# Patient Record
Sex: Male | Born: 1940
Health system: Southern US, Community
[De-identification: ages and names within clinical notes are randomized; demographics above are authoritative.]

## PROBLEM LIST (undated history)

## (undated) DIAGNOSIS — H9193 Unspecified hearing loss, bilateral: Secondary | ICD-10-CM

## (undated) DIAGNOSIS — C220 Liver cell carcinoma: Secondary | ICD-10-CM

## (undated) DIAGNOSIS — I10 Essential (primary) hypertension: Secondary | ICD-10-CM

## (undated) DIAGNOSIS — K7460 Unspecified cirrhosis of liver: Secondary | ICD-10-CM

## (undated) DIAGNOSIS — M1990 Unspecified osteoarthritis, unspecified site: Secondary | ICD-10-CM

## (undated) DIAGNOSIS — I8500 Esophageal varices without bleeding: Secondary | ICD-10-CM

## (undated) DIAGNOSIS — K7030 Alcoholic cirrhosis of liver without ascites: Secondary | ICD-10-CM

## (undated) HISTORY — PX: HERNIA REPAIR: SHX51

## (undated) HISTORY — PX: OTHER SURGICAL HISTORY: SHX169

---

## 1997-11-13 ENCOUNTER — Ambulatory Visit (HOSPITAL_COMMUNITY): Admission: RE | Admit: 1997-11-13 | Discharge: 1997-11-13 | Payer: Self-pay | Admitting: *Deleted

## 1998-02-07 ENCOUNTER — Ambulatory Visit (HOSPITAL_COMMUNITY): Admission: RE | Admit: 1998-02-07 | Discharge: 1998-02-07 | Payer: Self-pay | Admitting: *Deleted

## 1998-03-17 ENCOUNTER — Ambulatory Visit (HOSPITAL_COMMUNITY): Admission: RE | Admit: 1998-03-17 | Discharge: 1998-03-17 | Payer: Self-pay | Admitting: *Deleted

## 2001-09-01 ENCOUNTER — Ambulatory Visit (HOSPITAL_COMMUNITY): Admission: RE | Admit: 2001-09-01 | Discharge: 2001-09-01 | Payer: Self-pay | Admitting: *Deleted

## 2004-10-15 ENCOUNTER — Ambulatory Visit (HOSPITAL_COMMUNITY): Admission: RE | Admit: 2004-10-15 | Discharge: 2004-10-15 | Payer: Self-pay

## 2009-09-23 ENCOUNTER — Encounter: Admission: RE | Admit: 2009-09-23 | Discharge: 2009-09-23 | Payer: Self-pay | Admitting: Gastroenterology

## 2009-10-23 ENCOUNTER — Ambulatory Visit (HOSPITAL_COMMUNITY): Admission: RE | Admit: 2009-10-23 | Discharge: 2009-10-23 | Payer: Self-pay | Admitting: Gastroenterology

## 2009-12-11 ENCOUNTER — Ambulatory Visit (HOSPITAL_COMMUNITY): Admission: RE | Admit: 2009-12-11 | Discharge: 2009-12-11 | Payer: Self-pay | Admitting: Gastroenterology

## 2010-04-02 ENCOUNTER — Ambulatory Visit (HOSPITAL_COMMUNITY): Admission: RE | Admit: 2010-04-02 | Discharge: 2010-04-02 | Payer: Self-pay | Admitting: Gastroenterology

## 2010-08-09 ENCOUNTER — Encounter: Payer: Self-pay | Admitting: Gastroenterology

## 2010-10-07 LAB — CBC
MCHC: 34.7 g/dL (ref 30.0–36.0)
RBC: 3.52 MIL/uL — ABNORMAL LOW (ref 4.22–5.81)

## 2010-10-07 LAB — PROTIME-INR
INR: 1.17 (ref 0.00–1.49)
Prothrombin Time: 14.8 seconds (ref 11.6–15.2)

## 2011-08-19 ENCOUNTER — Other Ambulatory Visit: Payer: Self-pay | Admitting: Gastroenterology

## 2011-08-19 DIAGNOSIS — K7460 Unspecified cirrhosis of liver: Secondary | ICD-10-CM

## 2011-08-26 ENCOUNTER — Inpatient Hospital Stay: Admission: RE | Admit: 2011-08-26 | Payer: Self-pay | Source: Ambulatory Visit

## 2011-09-16 ENCOUNTER — Other Ambulatory Visit: Payer: Self-pay | Admitting: Gastroenterology

## 2011-09-16 ENCOUNTER — Ambulatory Visit
Admission: RE | Admit: 2011-09-16 | Discharge: 2011-09-16 | Disposition: A | Discharge status: Home / Self Care | Payer: Medicare Other | Source: Ambulatory Visit | Attending: Gastroenterology | Admitting: Gastroenterology

## 2011-09-16 DIAGNOSIS — K7460 Unspecified cirrhosis of liver: Secondary | ICD-10-CM

## 2011-09-16 MED ORDER — IOHEXOL 300 MG/ML  SOLN
100.0000 mL | Freq: Once | INTRAMUSCULAR | Status: AC | PRN
  Administered 2011-09-16: 100 mL via INTRAVENOUS

## 2013-08-14 ENCOUNTER — Other Ambulatory Visit: Payer: Self-pay | Admitting: Gastroenterology

## 2013-08-14 DIAGNOSIS — K7460 Unspecified cirrhosis of liver: Secondary | ICD-10-CM

## 2013-08-21 ENCOUNTER — Ambulatory Visit
Admission: RE | Admit: 2013-08-21 | Discharge: 2013-08-21 | Disposition: A | Discharge status: Home / Self Care | Payer: Medicare Other | Source: Ambulatory Visit | Attending: Gastroenterology | Admitting: Gastroenterology

## 2013-08-21 DIAGNOSIS — K7460 Unspecified cirrhosis of liver: Secondary | ICD-10-CM

## 2014-05-01 ENCOUNTER — Other Ambulatory Visit: Payer: Self-pay | Admitting: Gastroenterology

## 2014-09-20 ENCOUNTER — Other Ambulatory Visit: Payer: Self-pay | Admitting: Gastroenterology

## 2014-09-20 DIAGNOSIS — K7030 Alcoholic cirrhosis of liver without ascites: Secondary | ICD-10-CM

## 2014-09-20 DIAGNOSIS — R1084 Generalized abdominal pain: Secondary | ICD-10-CM

## 2014-09-26 ENCOUNTER — Ambulatory Visit
Admission: RE | Admit: 2014-09-26 | Discharge: 2014-09-26 | Disposition: A | Discharge status: Home / Self Care | Payer: Self-pay | Source: Ambulatory Visit | Attending: Gastroenterology | Admitting: Gastroenterology

## 2014-09-26 DIAGNOSIS — K7030 Alcoholic cirrhosis of liver without ascites: Secondary | ICD-10-CM

## 2014-09-26 DIAGNOSIS — R1084 Generalized abdominal pain: Secondary | ICD-10-CM

## 2014-09-26 MED ORDER — IOPAMIDOL (ISOVUE-300) INJECTION 61%
100.0000 mL | Freq: Once | INTRAVENOUS | Status: AC | PRN
  Administered 2014-09-26: 100 mL via INTRAVENOUS

## 2015-09-02 DIAGNOSIS — H1132 Conjunctival hemorrhage, left eye: Secondary | ICD-10-CM | POA: Diagnosis not present

## 2015-09-02 DIAGNOSIS — S0500XA Injury of conjunctiva and corneal abrasion without foreign body, unspecified eye, initial encounter: Secondary | ICD-10-CM | POA: Diagnosis not present

## 2015-09-11 DIAGNOSIS — H1132 Conjunctival hemorrhage, left eye: Secondary | ICD-10-CM | POA: Diagnosis not present

## 2015-09-18 ENCOUNTER — Other Ambulatory Visit: Payer: Self-pay | Admitting: Gastroenterology

## 2015-09-18 DIAGNOSIS — K7030 Alcoholic cirrhosis of liver without ascites: Secondary | ICD-10-CM

## 2015-09-30 ENCOUNTER — Ambulatory Visit
Admission: RE | Admit: 2015-09-30 | Discharge: 2015-09-30 | Disposition: A | Discharge status: Home / Self Care | Payer: Medicare Other | Source: Ambulatory Visit | Attending: Gastroenterology | Admitting: Gastroenterology

## 2015-09-30 DIAGNOSIS — K7030 Alcoholic cirrhosis of liver without ascites: Secondary | ICD-10-CM

## 2016-04-14 DIAGNOSIS — Z23 Encounter for immunization: Secondary | ICD-10-CM | POA: Diagnosis not present

## 2016-05-12 ENCOUNTER — Other Ambulatory Visit: Payer: Self-pay | Admitting: Gastroenterology

## 2016-05-12 DIAGNOSIS — K7030 Alcoholic cirrhosis of liver without ascites: Secondary | ICD-10-CM

## 2016-05-17 ENCOUNTER — Ambulatory Visit
Admission: RE | Admit: 2016-05-17 | Discharge: 2016-05-17 | Disposition: A | Discharge status: Home / Self Care | Payer: Medicare Other | Source: Ambulatory Visit | Attending: Gastroenterology | Admitting: Gastroenterology

## 2016-05-17 DIAGNOSIS — K7689 Other specified diseases of liver: Secondary | ICD-10-CM | POA: Diagnosis not present

## 2016-05-17 DIAGNOSIS — K7030 Alcoholic cirrhosis of liver without ascites: Secondary | ICD-10-CM

## 2016-05-17 MED ORDER — IOPAMIDOL (ISOVUE-300) INJECTION 61%
100.0000 mL | Freq: Once | INTRAVENOUS | Status: AC | PRN
  Administered 2016-05-17: 100 mL via INTRAVENOUS

## 2016-05-25 DIAGNOSIS — C220 Liver cell carcinoma: Secondary | ICD-10-CM | POA: Diagnosis not present

## 2016-06-08 ENCOUNTER — Other Ambulatory Visit (HOSPITAL_COMMUNITY): Payer: Self-pay | Admitting: General Surgery

## 2016-06-08 ENCOUNTER — Other Ambulatory Visit: Payer: Self-pay | Admitting: General Surgery

## 2016-06-08 DIAGNOSIS — C220 Liver cell carcinoma: Secondary | ICD-10-CM

## 2016-06-17 ENCOUNTER — Ambulatory Visit (HOSPITAL_COMMUNITY): Payer: Medicare Other

## 2016-06-17 ENCOUNTER — Ambulatory Visit
Admission: RE | Admit: 2016-06-17 | Discharge: 2016-06-17 | Disposition: A | Discharge status: Home / Self Care | Payer: No Typology Code available for payment source | Source: Ambulatory Visit | Attending: General Surgery | Admitting: General Surgery

## 2016-06-17 ENCOUNTER — Ambulatory Visit (HOSPITAL_COMMUNITY)
Admission: RE | Admit: 2016-06-17 | Discharge: 2016-06-17 | Disposition: A | Discharge status: Home / Self Care | Payer: Medicare Other | Source: Ambulatory Visit | Attending: General Surgery | Admitting: General Surgery

## 2016-06-17 DIAGNOSIS — C220 Liver cell carcinoma: Secondary | ICD-10-CM

## 2016-06-17 DIAGNOSIS — K769 Liver disease, unspecified: Secondary | ICD-10-CM | POA: Diagnosis not present

## 2016-06-17 DIAGNOSIS — K7460 Unspecified cirrhosis of liver: Secondary | ICD-10-CM | POA: Diagnosis not present

## 2016-06-17 HISTORY — DX: Alcoholic cirrhosis of liver without ascites: K70.30

## 2016-06-17 HISTORY — DX: Unspecified cirrhosis of liver: K74.60

## 2016-06-17 HISTORY — DX: Esophageal varices without bleeding: I85.00

## 2016-06-17 HISTORY — DX: Essential (primary) hypertension: I10

## 2016-06-17 HISTORY — DX: Liver cell carcinoma: C22.0

## 2016-06-17 HISTORY — PX: IR GENERIC HISTORICAL: IMG1180011

## 2016-06-17 MED ORDER — GADOXETATE DISODIUM 0.25 MMOL/ML IV SOLN
10.0000 mL | Freq: Once | INTRAVENOUS | Status: AC | PRN
  Administered 2016-06-17: 6 mL via INTRAVENOUS

## 2016-06-17 NOTE — Consult Note (Signed)
Chief Complaint: Indeterminate liver lesion  Referring Physician(s): Byerly (Hepatobiliary Surgery) [name] (GI)  History of Present Illness: Mike Ferguson is a 75 y.o. male with past medical history significant for hypertension, esophageal varices and alcoholic cirrhosis (patient has not consumed alcohol since 2011) who was found to have an indeterminate liver lesion worrisome for hepatocellular carcinoma on an surveillance abdominal CT performed 05/17/2016 obtained for an elevated AFP of 23 (on 10/23).  Patient was initially seen in consultation by Dr. Barry Dienes on 11/9 who felt the patient could benefit from undergoing a microwave ablation in lieu of surgical resection and referred the patient to interventional radiology clinic for evaluation of percutaneous treatment options. The patient is accompanied by his wife though serves as his own historian.  The patient is currently without complaint. No abdominal pain. No fever or chills. No unintentional weight loss or weight gain. No nausea or vomiting. No yellowing of the skin or eyes. No increased abdominal girth. No confusion.  Past Medical History:  Diagnosis Date  . Cirrhosis ([place])   . Cirrhosis, alcoholic ([place])   . Esophageal varices ([place])   . Hepatocellular carcinoma ([place])   . Hypertension     Past Surgical History:  Procedure Laterality Date  . HERNIA REPAIR    . Left rotator cuff      Allergies: Erythromycin  Medications: Prior to Admission medications   Medication Sig Start Date End Date Taking? Authorizing Provider  Cyanocobalamin (VITAMIN B 12 PO) Take 1 tablet by mouth daily.   Yes Historical Provider, MD  loratadine-pseudoephedrine (CLARITIN-D 24-HOUR) 10-240 MG 24 hr tablet Take 1 tablet by mouth daily.   Yes Historical Provider, MD  mirtazapine (REMERON) 15 MG tablet Take 15 mg by mouth at bedtime.   Yes Historical Provider, MD  propranolol (INDERAL) 10 MG tablet Take 10 mg by mouth 2 (two) times daily.   Yes  Historical Provider, MD  rifaximin (XIFAXAN) 550 MG TABS tablet Take 550 mg by mouth 2 (two) times daily.   Yes Historical Provider, MD  sodium chloride (OCEAN) 0.65 % SOLN nasal spray Place 1 spray into both nostrils as needed for congestion.   Yes Historical Provider, MD  traMADol (ULTRAM) 50 MG tablet Take by mouth 3 (three) times daily as needed.   Yes Historical Provider, MD  vitamin C (ASCORBIC ACID) 500 MG tablet Take 500 mg by mouth daily.   Yes Historical Provider, MD     No family history on file.  Social History   Social History  . Marital status: Married    Spouse name: N/A  . Number of children: N/A  . Years of education: N/A   Social History Main Topics  . Smoking status: Not on file  . Smokeless tobacco: Not on file  . Alcohol use Not on file  . Drug use: Unknown  . Sexual activity: Not on file   Other Topics Concern  . Not on file   Social History Narrative  . No narrative on file    ECOG Status: 0 - Asymptomatic  Review of Systems: A 12 point ROS discussed and pertinent positives are indicated in the HPI above.  All other systems are negative.  Review of Systems  Constitutional: Negative for activity change, appetite change, fatigue, fever and unexpected weight change.  Respiratory: Negative.   Cardiovascular: Negative.   Gastrointestinal: Negative.  Negative for abdominal pain.  Skin: Negative.   Psychiatric/Behavioral: Negative for confusion.    Vital Signs: BP (!) 175/82 (BP Location:  Left Arm, Patient Position: Sitting, Cuff Size: Normal)   Pulse (!) 57   Temp 97.5 F (36.4 C) (Oral)   Resp 16   Ht 5\' 5"  (1.651 m)   Wt 147 lb (66.7 kg)   SpO2 98%   BMI 24.46 kg/m   Physical Exam  Constitutional: He appears well-developed and well-nourished.  HENT:  Head: Normocephalic and atraumatic.  Eyes: Conjunctivae are normal.  Cardiovascular: Normal rate and regular rhythm.   Pulmonary/Chest: Effort normal and breath sounds normal.  Abdominal:  Soft. Bowel sounds are normal. He exhibits no mass. There is no tenderness.  Psychiatric: He has a normal mood and affect. His behavior is normal.  Nursing note and vitals reviewed.  Imaging:  Cirrhotic protocol CT scan - 05/17/2016; abdominal MRI - 06/17/2016  Labs:  CBC: PLT - 96  (05/10/2016)  COAGS: INR - 1.1 (05/10/2016)  BMP: No results for input(s): NA, K, CL, CO2, GLUCOSE, BUN, CALCIUM, CREATININE, GFRNONAA, GFRAA in the last 8760 hours.  Invalid input(s): CMP  LIVER FUNCTION TESTS: T-bili - 1.1  (05/10/2016) ALP - 135  (05/10/2016) AST - 46  (05/10/2016)  TUMOR MARKERS: AFP - 23.8 (05/10/2016)  Assessment and Plan:  Mike Ferguson is a 75 y.o. male with past medical history significant for hypertension, esophageal varices and alcoholic cirrhosis (patient has not consumed alcohol since 2011) who was found to have an indeterminate liver lesion worrisome for hepatocellular carcinoma on an surveillance abdominal CT performed 05/17/2016 obtained for an elevated AFP of 23 (on 10/23).    Cirrhotic protocol CT scan performed 05/17/2016 and abdominal MRI performed earlier today demonstrates a solitary approximately 1.6 cm lesion within the dome of the right lobe of the liver compatible with a hepatocellular carcinoma.  Potential treatment options including observation were discussed in detail with the patient. The present time, the patient wishes to undergo an intervention though wishes to avoid surgery.  As such, given lesion size and location, I feel this patient would be a good candidate for microwave ablation.  As such, risk and benefits of image guided microwave ablation (including but not limited to bleeding, infection, injury to adjacent organ, bile leak and incomplete ablation) were discussed in detail with the patient and the patient's wife.  Following this prolonged and detailed conversation, the patient wishes to pursue microwave ablation.  The ablation will be  scheduled at [facility] at the next available anesthesia slot (likely early 2018). The procedure will entail an overnight admission for continued observation and PCA usage.  The patient was encouraged to call the interventional radiology clinic with any interval questions or concerns.   Thank you for this interesting consult.  I greatly enjoyed meeting Mike Ferguson and look forward to participating in their care.  A copy of this report was sent to the requesting provider on this date.  Electronically Signed: Sandi Mariscal 06/17/2016, 3:41 PM   I spent a total of 30 Minutes in face to face in clinical consultation, greater than 50% of which was counseling/coordinating care for percutaneous treatment options for indeterminate liver lesion

## 2016-06-18 ENCOUNTER — Other Ambulatory Visit (HOSPITAL_COMMUNITY): Payer: Self-pay | Admitting: Interventional Radiology

## 2016-06-18 DIAGNOSIS — K769 Liver disease, unspecified: Secondary | ICD-10-CM

## 2016-06-19 ENCOUNTER — Other Ambulatory Visit: Payer: Medicare Other

## 2016-06-21 ENCOUNTER — Other Ambulatory Visit: Payer: Self-pay | Admitting: General Surgery

## 2016-06-24 ENCOUNTER — Encounter (HOSPITAL_COMMUNITY)
Admission: RE | Admit: 2016-06-24 | Discharge: 2016-06-24 | Disposition: A | Discharge status: Home / Self Care | Payer: Non-veteran care | Source: Ambulatory Visit | Attending: Interventional Radiology | Admitting: Interventional Radiology

## 2016-06-24 ENCOUNTER — Encounter (HOSPITAL_COMMUNITY): Payer: Self-pay

## 2016-06-24 ENCOUNTER — Other Ambulatory Visit: Payer: Self-pay | Admitting: Student

## 2016-06-24 DIAGNOSIS — Z79899 Other long term (current) drug therapy: Secondary | ICD-10-CM | POA: Diagnosis not present

## 2016-06-24 DIAGNOSIS — I10 Essential (primary) hypertension: Secondary | ICD-10-CM | POA: Diagnosis not present

## 2016-06-24 DIAGNOSIS — Z87891 Personal history of nicotine dependence: Secondary | ICD-10-CM | POA: Diagnosis not present

## 2016-06-24 DIAGNOSIS — Z8619 Personal history of other infectious and parasitic diseases: Secondary | ICD-10-CM | POA: Diagnosis not present

## 2016-06-24 DIAGNOSIS — C220 Liver cell carcinoma: Secondary | ICD-10-CM | POA: Diagnosis present

## 2016-06-24 HISTORY — DX: Unspecified osteoarthritis, unspecified site: M19.90

## 2016-06-24 HISTORY — DX: Unspecified hearing loss, bilateral: H91.93

## 2016-06-24 LAB — CBC WITH DIFFERENTIAL/PLATELET
BASOS ABS: 0 10*3/uL (ref 0.0–0.1)
Basophils Relative: 0 %
EOS PCT: 13 %
Eosinophils Absolute: 0.8 10*3/uL — ABNORMAL HIGH (ref 0.0–0.7)
HCT: 39.9 % (ref 39.0–52.0)
HEMOGLOBIN: 13.8 g/dL (ref 13.0–17.0)
LYMPHS PCT: 27 %
Lymphs Abs: 1.7 10*3/uL (ref 0.7–4.0)
MCH: 32.5 pg (ref 26.0–34.0)
MCHC: 34.6 g/dL (ref 30.0–36.0)
MCV: 94.1 fL (ref 78.0–100.0)
Monocytes Absolute: 1.5 10*3/uL — ABNORMAL HIGH (ref 0.1–1.0)
Monocytes Relative: 24 %
NEUTROS ABS: 2.2 10*3/uL (ref 1.7–7.7)
NEUTROS PCT: 35 %
PLATELETS: 94 10*3/uL — AB (ref 150–400)
RBC: 4.24 MIL/uL (ref 4.22–5.81)
RDW: 13.9 % (ref 11.5–15.5)
WBC: 6.2 10*3/uL (ref 4.0–10.5)

## 2016-06-24 LAB — COMPREHENSIVE METABOLIC PANEL
ALT: 28 U/L (ref 17–63)
AST: 47 U/L — AB (ref 15–41)
Albumin: 4.9 g/dL (ref 3.5–5.0)
Alkaline Phosphatase: 122 U/L (ref 38–126)
Anion gap: 8 (ref 5–15)
BUN: 12 mg/dL (ref 6–20)
CHLORIDE: 103 mmol/L (ref 101–111)
CO2: 29 mmol/L (ref 22–32)
CREATININE: 1 mg/dL (ref 0.61–1.24)
Calcium: 9.6 mg/dL (ref 8.9–10.3)
GFR calc Af Amer: >60 mL/min (ref >60)
Glucose, Bld: 93 mg/dL (ref 65–99)
Potassium: 4.9 mmol/L (ref 3.5–5.1)
SODIUM: 140 mmol/L (ref 135–145)
Total Bilirubin: 1.5 mg/dL — ABNORMAL HIGH (ref 0.3–1.2)
Total Protein: 7.7 g/dL (ref 6.5–8.1)

## 2016-06-24 LAB — PROTIME-INR
INR: 1.17
Prothrombin Time: 14.9 seconds (ref 11.4–15.2)

## 2016-06-24 LAB — ABO/RH: ABO/RH(D): O NEG

## 2016-06-24 NOTE — Pre-Procedure Instructions (Signed)
EKG done today.

## 2016-06-24 NOTE — Patient Instructions (Signed)
JAHSIAH SIEGLER  06/24/2016   Your procedure is scheduled on: 06-25-16  Report to [facility] Main  Entrance take [facility]  elevators to 3rd floor to  [place] at [place] AM.  Call this number if you have problems the morning of surgery [phone]   Remember: ONLY 1 PERSON MAY GO WITH YOU TO SHORT STAY TO GET  READY MORNING OF YOUR SURGERY.  Do not eat food or drink liquids :After Midnight.     Take these medicines the morning of surgery with A SIP OF WATER: Propranolol. Rifaximin. Tramadol. DO NOT TAKE ANY DIABETIC MEDICATIONS DAY OF YOUR SURGERY                               You may not have any metal on your body including hair pins and              piercings  Do not wear jewelry, make-up, lotions, powders or perfumes, deodorant             Do not wear nail polish.  Do not shave  48 hours prior to surgery.              Men may shave face and neck.   Do not bring valuables to the hospital. [place].  Contacts, dentures or bridgework may not be worn into surgery.  Leave suitcase in the car. After surgery it may be brought to your room.     Patients discharged the day of surgery will not be allowed to drive home.  Name and phone number of your driver: Malachy Mood -spouse [phone] cell  Special Instructions: N/A              Please read over the following fact sheets you were given: _____________________________________________________________________             [facility] - Preparing for Surgery Before surgery, you can play an important role.  Because skin is not sterile, your skin needs to be as free of germs as possible.  You can reduce the number of germs on your skin by washing with CHG (chlorahexidine gluconate) soap before surgery.  CHG is an antiseptic cleaner which kills germs and bonds with the skin to continue killing germs even after washing. Please DO NOT use if you have an allergy to CHG or  antibacterial soaps.  If your skin becomes reddened/irritated stop using the CHG and inform your nurse when you arrive at Short Stay. Do not shave (including legs and underarms) for at least 48 hours prior to the first CHG shower.  You may shave your face/neck. Please follow these instructions carefully:  1.  Shower with CHG Soap the night before surgery and the  morning of Surgery.  2.  If you choose to wash your hair, wash your hair first as usual with your  normal  shampoo.  3.  After you shampoo, rinse your hair and body thoroughly to remove the  shampoo.                           4.  Use CHG as you would any other liquid soap.  You can apply chg directly  to  the skin and wash                       Gently with a scrungie or clean washcloth.  5.  Apply the CHG Soap to your body ONLY FROM THE NECK DOWN.   Do not use on face/ open                           Wound or open sores. Avoid contact with eyes, ears mouth and genitals (private parts).                       Wash face,  Genitals (private parts) with your normal soap.             6.  Wash thoroughly, paying special attention to the area where your surgery  will be performed.  7.  Thoroughly rinse your body with warm water from the neck down.  8.  DO NOT shower/wash with your normal soap after using and rinsing off  the CHG Soap.                9.  Pat yourself dry with a clean towel.            10.  Wear clean pajamas.            11.  Place clean sheets on your bed the night of your first shower and do not  sleep with pets. Day of Surgery : Do not apply any lotions/deodorants the morning of surgery.  Please wear clean clothes to the hospital/surgery center.  FAILURE TO FOLLOW THESE INSTRUCTIONS MAY RESULT IN THE CANCELLATION OF YOUR SURGERY PATIENT SIGNATURE_________________________________  NURSE SIGNATURE__________________________________  ________________________________________________________________________

## 2016-06-25 ENCOUNTER — Ambulatory Visit (HOSPITAL_COMMUNITY): Payer: Non-veteran care | Admitting: Anesthesiology

## 2016-06-25 ENCOUNTER — Encounter (HOSPITAL_COMMUNITY): Payer: Self-pay | Admitting: *Deleted

## 2016-06-25 ENCOUNTER — Encounter (HOSPITAL_COMMUNITY)
Admission: RE | Disposition: A | Discharge status: Home / Self Care | Payer: Self-pay | Source: Ambulatory Visit | Attending: Interventional Radiology

## 2016-06-25 ENCOUNTER — Encounter (HOSPITAL_COMMUNITY): Payer: Self-pay

## 2016-06-25 ENCOUNTER — Ambulatory Visit (HOSPITAL_COMMUNITY)
Admission: RE | Admit: 2016-06-25 | Discharge: 2016-06-25 | Disposition: A | Discharge status: Home / Self Care | Payer: Non-veteran care | Source: Ambulatory Visit | Attending: Interventional Radiology | Admitting: Interventional Radiology

## 2016-06-25 ENCOUNTER — Observation Stay (HOSPITAL_COMMUNITY)
Admission: RE | Admit: 2016-06-25 | Discharge: 2016-06-26 | Disposition: A | Discharge status: Home / Self Care | Payer: Non-veteran care | Source: Ambulatory Visit | Attending: Interventional Radiology | Admitting: Interventional Radiology

## 2016-06-25 DIAGNOSIS — C220 Liver cell carcinoma: Principal | ICD-10-CM | POA: Diagnosis present

## 2016-06-25 DIAGNOSIS — Z79899 Other long term (current) drug therapy: Secondary | ICD-10-CM | POA: Diagnosis not present

## 2016-06-25 DIAGNOSIS — I10 Essential (primary) hypertension: Secondary | ICD-10-CM | POA: Insufficient documentation

## 2016-06-25 DIAGNOSIS — Z8619 Personal history of other infectious and parasitic diseases: Secondary | ICD-10-CM | POA: Diagnosis not present

## 2016-06-25 DIAGNOSIS — Z87891 Personal history of nicotine dependence: Secondary | ICD-10-CM | POA: Insufficient documentation

## 2016-06-25 DIAGNOSIS — K769 Liver disease, unspecified: Secondary | ICD-10-CM

## 2016-06-25 SURGERY — RADIO FREQUENCY ABLATION
Anesthesia: General

## 2016-06-25 MED ORDER — IOPAMIDOL (ISOVUE-300) INJECTION 61%
100.0000 mL | Freq: Once | INTRAVENOUS | Status: AC | PRN
  Administered 2016-06-25: 100 mL via INTRAVENOUS

## 2016-06-25 MED ORDER — EPHEDRINE SULFATE-NACL 50-0.9 MG/10ML-% IV SOSY
PREFILLED_SYRINGE | INTRAVENOUS | Status: DC | PRN
  Administered 2016-06-25: 10 mg via INTRAVENOUS
  Administered 2016-06-25 (×3): 5 mg via INTRAVENOUS

## 2016-06-25 MED ORDER — FENTANYL CITRATE (PF) 100 MCG/2ML IJ SOLN
INTRAMUSCULAR | Status: AC
  Filled 2016-06-25: qty 6

## 2016-06-25 MED ORDER — ROCURONIUM BROMIDE 10 MG/ML (PF) SYRINGE
PREFILLED_SYRINGE | INTRAVENOUS | Status: DC | PRN
  Administered 2016-06-25: 10 mg via INTRAVENOUS
  Administered 2016-06-25: 5 mg via INTRAVENOUS
  Administered 2016-06-25: 50 mg via INTRAVENOUS
  Administered 2016-06-25: 20 mg via INTRAVENOUS

## 2016-06-25 MED ORDER — NALOXONE HCL 0.4 MG/ML IJ SOLN: 0.4000 mg | INTRAMUSCULAR | Status: DC | PRN

## 2016-06-25 MED ORDER — SUGAMMADEX SODIUM 200 MG/2ML IV SOLN
INTRAVENOUS | Status: DC | PRN
Start: 2016-06-25 — End: 2016-06-25
  Administered 2016-06-25: 140 mg via INTRAVENOUS

## 2016-06-25 MED ORDER — ONDANSETRON HCL 4 MG/2ML IJ SOLN
INTRAMUSCULAR | Status: DC | PRN
  Administered 2016-06-25: 4 mg via INTRAVENOUS

## 2016-06-25 MED ORDER — SODIUM CHLORIDE 0.9 % IV SOLN
INTRAVENOUS | Status: AC
  Filled 2016-06-25: qty 250

## 2016-06-25 MED ORDER — RIFAXIMIN 550 MG PO TABS
550.0000 mg | ORAL_TABLET | Freq: Two times a day (BID) | ORAL | Status: DC
  Administered 2016-06-25 – 2016-06-26 (×2): 550 mg via ORAL
  Filled 2016-06-25 (×3): qty 1

## 2016-06-25 MED ORDER — FENTANYL CITRATE (PF) 100 MCG/2ML IJ SOLN
INTRAMUSCULAR | Status: DC | PRN
  Administered 2016-06-25 (×3): 50 ug via INTRAVENOUS
  Administered 2016-06-25: 100 ug via INTRAVENOUS
  Administered 2016-06-25: 50 ug via INTRAVENOUS

## 2016-06-25 MED ORDER — SODIUM CHLORIDE 0.9% FLUSH: 9.0000 mL | INTRAVENOUS | Status: DC | PRN

## 2016-06-25 MED ORDER — PROPOFOL 10 MG/ML IV BOLUS
INTRAVENOUS | Status: DC | PRN
  Administered 2016-06-25: 110 mg via INTRAVENOUS

## 2016-06-25 MED ORDER — VITAMIN B-12 100 MCG PO TABS
100.0000 ug | ORAL_TABLET | Freq: Every day | ORAL | Status: DC
  Administered 2016-06-26: 100 ug via ORAL
  Filled 2016-06-25: qty 1

## 2016-06-25 MED ORDER — LIDOCAINE-EPINEPHRINE (PF) 2 %-1:200000 IJ SOLN
INTRAMUSCULAR | Status: AC
  Filled 2016-06-25: qty 20

## 2016-06-25 MED ORDER — LIDOCAINE 2% (20 MG/ML) 5 ML SYRINGE
INTRAMUSCULAR | Status: DC | PRN
  Administered 2016-06-25: 100 mg via INTRAVENOUS

## 2016-06-25 MED ORDER — DIPHENHYDRAMINE HCL 12.5 MG/5ML PO ELIX: 12.5000 mg | ORAL_SOLUTION | Freq: Four times a day (QID) | ORAL | Status: DC | PRN

## 2016-06-25 MED ORDER — IOPAMIDOL (ISOVUE-300) INJECTION 61%
INTRAVENOUS | Status: AC
  Filled 2016-06-25: qty 30

## 2016-06-25 MED ORDER — LACTATED RINGERS IV SOLN
INTRAVENOUS | Status: DC
  Administered 2016-06-25 (×2): via INTRAVENOUS

## 2016-06-25 MED ORDER — HYDROMORPHONE 1 MG/ML IV SOLN
INTRAVENOUS | Status: DC
  Administered 2016-06-25: 17:00:00 via INTRAVENOUS
  Administered 2016-06-25: 1.1 mg via INTRAVENOUS
  Administered 2016-06-25: 0.6 mg via INTRAVENOUS
  Administered 2016-06-26: 0 mg via INTRAVENOUS
  Administered 2016-06-26: 0.6 mg via INTRAVENOUS
  Filled 2016-06-25: qty 25

## 2016-06-25 MED ORDER — CEFAZOLIN SODIUM-DEXTROSE 2-4 GM/100ML-% IV SOLN
INTRAVENOUS | Status: AC
  Filled 2016-06-25: qty 100

## 2016-06-25 MED ORDER — DOCUSATE SODIUM 100 MG PO CAPS
100.0000 mg | ORAL_CAPSULE | Freq: Two times a day (BID) | ORAL | Status: DC
  Administered 2016-06-25 – 2016-06-26 (×2): 100 mg via ORAL
  Filled 2016-06-25 (×5): qty 1

## 2016-06-25 MED ORDER — MIRTAZAPINE 15 MG PO TABS
15.0000 mg | ORAL_TABLET | Freq: Every day | ORAL | Status: DC
  Administered 2016-06-25: 15 mg via ORAL
  Filled 2016-06-25 (×3): qty 1

## 2016-06-25 MED ORDER — PROPRANOLOL HCL 10 MG PO TABS
20.0000 mg | ORAL_TABLET | Freq: Two times a day (BID) | ORAL | Status: DC
  Administered 2016-06-25 – 2016-06-26 (×2): 20 mg via ORAL
  Filled 2016-06-25: qty 2
  Filled 2016-06-25 (×3): qty 1
  Filled 2016-06-25: qty 2

## 2016-06-25 MED ORDER — DIPHENHYDRAMINE HCL 50 MG/ML IJ SOLN: 12.5000 mg | Freq: Four times a day (QID) | INTRAMUSCULAR | Status: DC | PRN

## 2016-06-25 MED ORDER — IOPAMIDOL (ISOVUE-300) INJECTION 61%
INTRAVENOUS | Status: AC
  Filled 2016-06-25: qty 100

## 2016-06-25 MED ORDER — ONDANSETRON HCL 4 MG/2ML IJ SOLN: 4.0000 mg | Freq: Four times a day (QID) | INTRAMUSCULAR | Status: DC | PRN

## 2016-06-25 MED ORDER — CEFAZOLIN SODIUM-DEXTROSE 2-4 GM/100ML-% IV SOLN
2.0000 g | INTRAVENOUS | Status: AC
  Administered 2016-06-25: 2 g via INTRAVENOUS
  Filled 2016-06-25: qty 100

## 2016-06-25 MED ORDER — SODIUM CHLORIDE 0.9 % IJ SOLN
INTRAMUSCULAR | Status: AC
  Filled 2016-06-25: qty 50

## 2016-06-25 NOTE — Anesthesia Postprocedure Evaluation (Signed)
Anesthesia Post Note  Patient: Mike Ferguson  Procedure(s) Performed: Procedure(s) (LRB): MICROWAVELIVER ABLATION (N/A)  Patient location during evaluation: PACU Anesthesia Type: General Level of consciousness: sedated Pain management: pain level controlled Vital Signs Assessment: post-procedure vital signs reviewed and stable Respiratory status: spontaneous breathing and respiratory function stable Cardiovascular status: stable Anesthetic complications: no    Last Vitals:  Vitals:   06/25/16 1603 06/25/16 1608  BP: (!) 179/79 (!) 179/79  Pulse: 62 64  Resp: 16 16  Temp: 36.4 C 36.4 C    Last Pain:  Vitals:   06/25/16 1609  TempSrc:   PainSc: 0-No pain                 Laverda Stribling,Donell DANIEL

## 2016-06-25 NOTE — Procedures (Signed)
Technically successful [place] and CT guided MWA of presumed [place] within the right lobe of the liver.  EBL: Minimal No immediate post procedural complications.   Ronny Bacon, MD Pager #: [phone]

## 2016-06-25 NOTE — Anesthesia Preprocedure Evaluation (Signed)
Anesthesia Evaluation  Patient identified by MRN, date of birth, ID band Patient awake    Reviewed: Allergy & Precautions, NPO status , Patient's Chart, lab work & pertinent test results, reviewed documented beta blocker date and time   Airway Mallampati: II  TM Distance: <3 FB Neck ROM: Limited    Dental  (+) Poor Dentition, Dental Advisory Given   Pulmonary former smoker,    Pulmonary exam normal        Cardiovascular hypertension, Pt. on medications and Pt. on home beta blockers Normal cardiovascular exam     Neuro/Psych negative neurological ROS  negative psych ROS   GI/Hepatic negative GI ROS, (+) Cirrhosis       ,   Endo/Other  negative endocrine ROS  Renal/GU negative Renal ROS  negative genitourinary   Musculoskeletal negative musculoskeletal ROS (+)   Abdominal   Peds negative pediatric ROS (+)  Hematology negative hematology ROS (+)   Anesthesia Other Findings   Reproductive/Obstetrics negative OB ROS                             Anesthesia Physical Anesthesia Plan  ASA: III  Anesthesia Plan: General   Post-op Pain Management:    Induction: Intravenous  Airway Management Planned: Oral ETT  Additional Equipment:   Intra-op Plan:   Post-operative Plan: Extubation in OR  Informed Consent: I have reviewed the patients History and Physical, chart, labs and discussed the procedure including the risks, benefits and alternatives for the proposed anesthesia with the patient or authorized representative who has indicated his/her understanding and acceptance.   Dental advisory given  Plan Discussed with: CRNA and Anesthesiologist  Anesthesia Plan Comments:         Anesthesia Quick Evaluation

## 2016-06-25 NOTE — Transfer of Care (Signed)
Immediate Anesthesia Transfer of Care Note  Patient: Mike Ferguson  Procedure(s) Performed: Procedure(s): MICROWAVELIVER ABLATION (N/A)  Patient Location: PACU  Anesthesia Type:General  Level of Consciousness: awake, alert , oriented and patient cooperative  Airway & Oxygen Therapy: Patient Spontanous Breathing and Patient connected to face mask oxygen  Post-op Assessment: Report given to RN, Post -op Vital signs reviewed and stable and Patient moving all extremities  Post vital signs: Reviewed and stable  Last Vitals: There were no vitals filed for this visit.  Last Pain: There were no vitals filed for this visit.    Patients Stated Pain Goal: 3 ([id number])  Complications: No apparent anesthesia complications

## 2016-06-25 NOTE — Anesthesia Procedure Notes (Signed)
Procedure Name: Intubation Date/Time: 06/25/2016 12:27 PM Performed by: Danley Danker L Patient Re-evaluated:Patient Re-evaluated prior to inductionOxygen Delivery Method: Circle system utilized Preoxygenation: Pre-oxygenation with 100% oxygen Intubation Type: IV induction Ventilation: Mask ventilation without difficulty and Oral airway inserted - appropriate to patient size Laryngoscope Size: Miller and 3 Grade View: Grade II Tube type: Oral Tube size: 7.5 mm Number of attempts: 2 Airway Equipment and Method: Stylet Placement Confirmation: ETT inserted through vocal cords under direct vision,  positive ETCO2 and breath sounds checked- equal and bilateral Secured at: 21 cm Tube secured with: Tape Dental Injury: Teeth and Oropharynx as per pre-operative assessment

## 2016-06-25 NOTE — H&P (Signed)
HPI:  The patient has had a H&P performed within the last 30 days, all history, medications, and exam have been reviewed. The patient denies any interval changes since the H&P. Scheduled today for CT guided cryoablation of right lobe liver lesion worrisome for hepatocellular carcinoma. See full note by Dr. Pascal Lux on 06/17/16 Feels well this am, no fevers, chills, N/V, abd pain.  Medications: Prior to Admission medications   Medication Sig Start Date End Date Taking? Authorizing Provider  loratadine-pseudoephedrine (CLARITIN-D 24-HOUR) 10-240 MG 24 hr tablet Take 1 tablet by mouth daily as needed for allergies.     Historical Provider, MD  mirtazapine (REMERON) 15 MG tablet Take 15 mg by mouth at bedtime.    Historical Provider, MD  propranolol (INDERAL) 20 MG tablet Take 20 mg by mouth 2 (two) times daily. 05/31/16   Historical Provider, MD  rifaximin (XIFAXAN) 550 MG TABS tablet Take 550 mg by mouth 2 (two) times daily.    Historical Provider, MD  sodium chloride (OCEAN) 0.65 % SOLN nasal spray Place 1 spray into both nostrils as needed for congestion.    Historical Provider, MD  traMADol (ULTRAM) 50 MG tablet Take 50 mg by mouth 3 (three) times daily as needed for moderate pain.     Historical Provider, MD  vitamin B-12 (CYANOCOBALAMIN) 100 MCG tablet Take 100 mcg by mouth daily.    Historical Provider, MD  vitamin C (ASCORBIC ACID) 500 MG tablet Take 500 mg by mouth daily.    Historical Provider, MD     Vital Signs: BP (!) 156/83   Pulse (!) 53   Temp 98.1 F (36.7 C) (Oral)   Resp 16   Ht 5\' 5"  (1.651 m)   Wt 147 lb (66.7 kg)   SpO2 100%   BMI 24.46 kg/m   Physical Exam  Constitutional: He is oriented to person, place, and time. He appears well-developed and well-nourished. No distress.  HENT:  Head: Normocephalic.  Mouth/Throat: Oropharynx is clear and moist.  Neck: Normal range of motion. No tracheal deviation present.  Cardiovascular: Normal rate, regular rhythm and  normal heart sounds.   Pulmonary/Chest: Effort normal and breath sounds normal. No respiratory distress.  Abdominal: Soft. He exhibits no distension and no mass. There is no tenderness.  Neurological: He is alert and oriented to person, place, and time.  Skin: Skin is warm and dry.  Psychiatric: He has a normal mood and affect. Judgment normal.     Labs:  CBC:  Recent Labs  06/24/16 1103  WBC 6.2  HGB 13.8  HCT 39.9  PLT 94*    COAGS:  Recent Labs  06/24/16 1103  INR 1.17    BMP:  Recent Labs  06/24/16 1103  NA 140  K 4.9  CL 103  CO2 29  GLUCOSE 93  BUN 12  CALCIUM 9.6  CREATININE 1.00  GFRNONAA >60  GFRAA >60    LIVER FUNCTION TESTS:  Recent Labs  06/24/16 1103  BILITOT 1.5*  AST 47*  ALT 28  ALKPHOS 122  PROT 7.7  ALBUMIN 4.9    Assessment/Plan:  Right hepatic lesion suspicious for [place] for CT guided microwave ablation. Labs reviewed Risks and Benefits discussed with the patient including, but not limited to failure to treat entire lesion, bleeding, infection, damage to adjacent structures, pneumothorax, decrease in liver function or post procedural neuropathy. All of the patient's questions were answered, patient is agreeable to proceed. Consent signed and in chart.   Signed: Ascencion Dike  06/25/2016, 10:45 AM

## 2016-06-25 NOTE — H&P (Deleted)
  The note originally documented on this encounter has been moved the the encounter in which it belongs.  

## 2016-06-26 ENCOUNTER — Other Ambulatory Visit: Payer: Self-pay | Admitting: Radiology

## 2016-06-26 DIAGNOSIS — C220 Liver cell carcinoma: Secondary | ICD-10-CM

## 2016-06-26 NOTE — Discharge Summary (Signed)
Patient ID: Mike Ferguson MRN: [id number] DOB/AGE: Dec 11, 1940 75 y.o.  Admit date: 06/25/2016 Discharge date: 06/26/2016  Supervising Physician: Sandi Mariscal  Patient Status: [facility] - In-pt  Admission Diagnoses: Right lobe hepatic mass  Discharge Diagnoses:  Active Problems:   Hepatocellular carcinoma [facility])   Discharged Condition: good  Hospital Course: Pt admitted on 12/8 after successful CT guided microwave ablation of right hepatic lesion. No immediate complications. Sent to floor in stable condition. No overnight issues. Foley removed, voiding well on own. No N/V, tolerating diet. Minimal pain. Stable for discharge home All instructions, medications, and follow up plans reviewed.  Consults: None   Discharge Exam: Blood pressure (!) 141/66, pulse 75, temperature 99.5 F (37.5 C), temperature source Oral, resp. rate 16, height 5\' 5"  (1.651 m), weight 147 lb 0.8 oz (66.7 kg), SpO2 100 %. General: NAD Lungs: CTA without w/r/r Heart: Regular Abdomen:soft, ND, NT. RUQ perc access site clean. Mild erythema, NT   Disposition: Final discharge disposition not confirmed  Discharge Instructions    Call MD for:  persistant nausea and vomiting    Complete by:  As directed    Call MD for:  redness, tenderness, or signs of infection (pain, swelling, redness, odor or green/yellow discharge around incision site)    Complete by:  As directed    Call MD for:  severe uncontrolled pain    Complete by:  As directed    Call MD for:  temperature >100.4    Complete by:  As directed    Diet - low sodium heart healthy    Complete by:  As directed    Increase activity slowly    Complete by:  As directed    May shower / Bathe    Complete by:  As directed    May walk up steps    Complete by:  As directed    No wound care    Complete by:  As directed        Medication List    TAKE these medications   loratadine-pseudoephedrine 10-240 MG 24 hr tablet Commonly known as:  CLARITIN-D  24-hour Take 1 tablet by mouth daily as needed for allergies.   mirtazapine 15 MG tablet Commonly known as:  REMERON Take 15 mg by mouth at bedtime.   propranolol 20 MG tablet Commonly known as:  INDERAL Take 20 mg by mouth 2 (two) times daily.   rifaximin 550 MG Tabs tablet Commonly known as:  XIFAXAN Take 550 mg by mouth 2 (two) times daily.   sodium chloride 0.65 % Soln nasal spray Commonly known as:  OCEAN Place 1 spray into both nostrils as needed for congestion.   traMADol 50 MG tablet Commonly known as:  ULTRAM Take 50 mg by mouth 3 (three) times daily as needed for moderate pain.   vitamin B-12 100 MCG tablet Commonly known as:  CYANOCOBALAMIN Take 100 mcg by mouth daily.   vitamin C 500 MG tablet Commonly known as:  ASCORBIC ACID Take 500 mg by mouth daily.      Follow-up Information    Sandi Mariscal, MD. Go in 4 week(s).   Specialty:  Interventional Radiology Why:  Office will call you to set up follow up appointment date and time Contact information: [address] [place] [address] [id number]            Electronically Signed: Ascencion Dike 06/26/2016, 8:03 AM   I have spent Less Than 30 Minutes discharging Mike Aquas  Ferguson.

## 2016-06-26 NOTE — Discharge Instructions (Signed)
Microwave Ablation, Care After This sheet gives you information about how to care for yourself after your procedure. Your health care provider may also give you more specific instructions. If you have problems or questions, contact your health care provider. What can I expect after the procedure? After the procedure, it is common to have:  Soreness around the treatment area.  Mild pain and swelling in the treatment area. Follow these instructions at home: Treatment area care   Follow instructions from your health care provider about how to take care of your incision. Make sure you:  Wash your hands with soap and water before you change your bandage (dressing). If soap and water are not available, use hand sanitizer.  Change your dressing as told by your health care provider.  Leave stitches (sutures) in place. They may need to stay in place for 2 weeks or longer.  Check your treatment area every day for signs of infection. Check for:  More redness, swelling, or pain.  More fluid or blood.  Warmth.  Pus or a bad smell.  Keep the treated area clean, dry, and covered with a dressing until it has healed. Clean the area with soap and water or as told by your health care provider.  You may shower if your health care provider approves. If your bandage gets wet, change it right away. Activity  Follow instructions from your health care provider about any activity limitations.  Do not drive for 24 hours if you received a medicine to help you relax (sedative). General instructions  Take over-the-counter and prescription medicines only as told by your health care provider.  Keep all follow-up visits as told by your health care provider. This is important. Contact a health care provider if:  You do not have a bowel movement for 2 days.  You have nausea or vomiting.  You have more redness, swelling, or pain around your treatment area.  You have more fluid or blood coming from your  treatment area.  Your treatment area feels warm to the touch.  You have pus or a bad smell coming from your treatment area.  You have a fever. Get help right away if:  You have severe pain.  You have trouble swallowing or breathing.  You have severe weakness or dizziness.  You have chest pain or shortness of breath. This information is not intended to replace advice given to you by your health care provider. Make sure you discuss any questions you have with your health care provider. Document Released: 04/25/2013 Document Revised: 01/23/2016 Document Reviewed: 12/03/2015 Elsevier Interactive Patient Education  2017 [employer].

## 2016-06-28 LAB — TYPE AND SCREEN
ABO/RH(D): O NEG
Antibody Screen: NEGATIVE

## 2016-07-05 ENCOUNTER — Other Ambulatory Visit: Payer: Self-pay | Admitting: *Deleted

## 2016-07-05 DIAGNOSIS — K769 Liver disease, unspecified: Secondary | ICD-10-CM

## 2016-07-05 DIAGNOSIS — M79672 Pain in left foot: Secondary | ICD-10-CM | POA: Diagnosis not present

## 2016-07-07 ENCOUNTER — Encounter: Payer: Self-pay | Admitting: Interventional Radiology

## 2016-07-14 DIAGNOSIS — K769 Liver disease, unspecified: Secondary | ICD-10-CM | POA: Diagnosis not present

## 2016-07-14 LAB — COMPLETE METABOLIC PANEL WITH GFR
ALT: 39 U/L (ref 9–46)
AST: 60 U/L — ABNORMAL HIGH (ref 10–35)
Albumin: 4.2 g/dL (ref 3.6–5.1)
Alkaline Phosphatase: 126 U/L — ABNORMAL HIGH (ref 40–115)
BILIRUBIN TOTAL: 1.1 mg/dL (ref 0.2–1.2)
BUN: 10 mg/dL (ref 7–25)
CALCIUM: 9.1 mg/dL (ref 8.6–10.3)
CHLORIDE: 103 mmol/L (ref 98–110)
CO2: 30 mmol/L (ref 20–31)
CREATININE: 0.93 mg/dL (ref 0.70–1.18)
GFR, EST NON AFRICAN AMERICAN: 80 mL/min (ref >=60)
Glucose, Bld: 150 mg/dL — ABNORMAL HIGH (ref 65–99)
Potassium: 5.3 mmol/L (ref 3.5–5.3)
Sodium: 139 mmol/L (ref 135–146)
Total Protein: 6.7 g/dL (ref 6.1–8.1)

## 2016-07-21 ENCOUNTER — Ambulatory Visit
Admission: RE | Admit: 2016-07-21 | Discharge: 2016-07-21 | Disposition: A | Discharge status: Home / Self Care | Payer: No Typology Code available for payment source | Source: Ambulatory Visit | Attending: Radiology | Admitting: Radiology

## 2016-07-21 DIAGNOSIS — C220 Liver cell carcinoma: Secondary | ICD-10-CM

## 2016-07-21 HISTORY — PX: IR GENERIC HISTORICAL: IMG1180011

## 2016-07-21 NOTE — Progress Notes (Signed)
Appetite:  Poor-fair.  Weight 140.5 lbs.  Fatigues easily.   Able to complete ADL'S with rest periods as needed.  Continues to experience RUQ discomfort.    Next follow up app't w/ Dr Michail Sermon 08/03/2016.    Khari Lett Riki Rusk, RN 07/21/2016 9:51 AM

## 2016-07-21 NOTE — Progress Notes (Signed)
Patient ID: Mike Ferguson, male   DOB: 1941/01/02, 76 y.o.   MRN: [id number]        Chief Complaint  Patient presents with  . Follow-up    1 mo follow up Thermal Ablation of Liver    Referring Physician(s): Byerly (Hepatobiliary Surgery) [name] (GI)  History of Present Illness: Mike Ferguson is a 76 y.o. male with past medical history sunitinib or hypertension, esophageal varices and alcoholic cirrhosis (patient has not consumed alcohol since 2011) who was found to have an indeterminate liver lesion worrisome for hepatocellular carcinoma and surveillance abdominal CT performed 06/17/2016 evaluation obtained for an elevated AFP of 23 (on 05/10/2016).  As such, patient underwent a technically successful and uncomplicated microwave ablation of the solitary hepatic lesion on 06/25/2016 and presents today to interventional radiology clinic for post procedural evaluation and management. He is accompanied by his wife though serves as his own historian.  The patient reports mild intermittent right upper quadrant abdominal pain though states this was present before the microwave ablation. He is otherwise without complaint. No fever or chills. No unintentional weight loss or weight gain. No increased abdominal girth. No change in mental status. Patient has returned to all activities of daily living.     Past Medical History:  Diagnosis Date  . Arthritis    generalized-back  . Cirrhosis ([place])   . Cirrhosis, alcoholic ([place])   . Esophageal varices ([place])   . Hearing impaired person, bilateral    hearing aids bilaterally  . Hepatocellular carcinoma ([place])   . Hypertension     Past Surgical History:  Procedure Laterality Date  . HERNIA REPAIR     RIH  . IR GENERIC HISTORICAL  06/17/2016   IR RADIOLOGIST EVAL & MGMT 06/17/2016 Sandi Mariscal, MD [facility] INTERV RAD  . Left rotator cuff      Allergies: Erythromycin  Medications: Prior to Admission medications   Medication Sig Start Date End  Date Taking? Authorizing Provider  cholecalciferol (VITAMIN D) 1000 units tablet Take 2,000 Units by mouth daily.   Yes Historical Provider, MD  mirtazapine (REMERON) 15 MG tablet Take 15 mg by mouth at bedtime.   Yes Historical Provider, MD  propranolol (INDERAL) 20 MG tablet Take 20 mg by mouth 2 (two) times daily. 05/31/16  Yes Historical Provider, MD  rifaximin (XIFAXAN) 550 MG TABS tablet Take 550 mg by mouth 2 (two) times daily.   Yes Historical Provider, MD  sodium chloride (OCEAN) 0.65 % SOLN nasal spray Place 1 spray into both nostrils as needed for congestion.   Yes Historical Provider, MD  traMADol (ULTRAM) 50 MG tablet Take 50 mg by mouth 3 (three) times daily as needed for moderate pain.    Yes Historical Provider, MD  vitamin B-12 (CYANOCOBALAMIN) 100 MCG tablet Take 100 mcg by mouth daily.   Yes Historical Provider, MD  vitamin C (ASCORBIC ACID) 500 MG tablet Take 500 mg by mouth daily.   Yes Historical Provider, MD  loratadine-pseudoephedrine (CLARITIN-D 24-HOUR) 10-240 MG 24 hr tablet Take 1 tablet by mouth daily as needed for allergies.     Historical Provider, MD     No family history on file.  Social History   Social History  . Marital status: Married    Spouse name: N/A  . Number of children: N/A  . Years of education: N/A   Social History Main Topics  . Smoking status: Former Smoker    Quit date: 06/24/1986  . Smokeless tobacco: Never Used  .  Alcohol use No     Comment: 2011 Quit- ETOH abuse  . Drug use: No  . Sexual activity: Not Currently   Other Topics Concern  . Not on file   Social History Narrative  . No narrative on file    ECOG Status: 0 - Asymptomatic  Review of Systems: A 12 point ROS discussed and pertinent positives are indicated in the HPI above.  All other systems are negative.  Review of Systems  Constitutional: Negative for activity change and appetite change.  Respiratory: Negative.   Cardiovascular: Negative.   Gastrointestinal:  Positive for abdominal pain.       Patient reports mild intermittent right upper quadrant abdominal pain though states this was present prior to the ablation.  Skin: Negative for color change.    Vital Signs: BP (!) 160/74 (BP Location: Left Arm, Patient Position: Sitting, Cuff Size: Normal)   Pulse (!) 48   Temp 98.2 F (36.8 C) (Oral)   Resp 15   Ht 5\' 5"  (1.651 m)   Wt 140 lb 8 oz (63.7 kg)   SpO2 97%   BMI 23.38 kg/m   Physical Exam  Constitutional: He appears well-developed and well-nourished.  Eyes: Conjunctivae are normal.  Abdominal: He exhibits no distension and no mass. There is no tenderness.  Well healed puncture site within the right upper abdominal quadrant.  Psychiatric: He has a normal mood and affect. His behavior is normal.  Nursing note reviewed.   Imaging:  Selected images from preprocedural abdominal MRI performed 06/17/2016 as well as intraprocedural images during microwave ablation performed 06/25/2016 were reviewed in detail with the patient and the patient's wife.  Ct Guide Tissue Ablation  Result Date: 06/25/2016 INDICATION: History of cirrhosis, now with an enhancing liver lesion compatible with hepatocellular carcinoma. Patient presents today for ultrasound and CT-guided percutaneous microwave liver lesion ablation. Please refer to formal consultation within the epic EMR performed on 06/17/2016 for additional details. EXAM: CT-GUIDED PERCUTANEOUS THERMAL ABLATION OF LIVER LESION COMPARISON:  Abdominal MRI - 06/17/2016; cirrhotic protocol abdominal CT - 05/17/2016 ANESTHESIA/SEDATION: General MEDICATIONS: Rocephin 1 gm IV. The antibiotic was administered in an appropriate time interval prior to needle puncture of the skin. CONTRAST:  139mL ISOVUE-300 IOPAMIDOL (ISOVUE-300) INJECTION 61% PROCEDURE: The procedure, risks, benefits, and alternatives were explained to the patient. Questions regarding the procedure were encouraged and answered. The patient  understands and consents to the procedure. The patient was placed under general anesthesia. Initial unenhanced CT was performed in a supine position to localize ill-defined lesion within the peripheral/subcapsular aspect of the dome of the right lobe of the liver with dominant ill-defined component measured approximately 1.2 cm in diameter (image 8, series 3). The procedure was planned. The patient was prepped with Betadine in a sterile fashion, and a sterile drape was applied covering the operative field. A sterile gown and sterile gloves were used for the procedure. Under direct ultrasound guidance, a 22 gauge spinal needle was advanced in expected trajectory of the microwave ablation probe for procedural planning. A contrast-enhanced CT scan was performed to ensure adequate lesion location. Next, a XT probe was advanced directed towards the ill-defined lesion utilizing a combination of ultrasound and CT guidance. Once appropriate positioning was obtained, the probe was stuck in place. A 22 gauge spinal needle was placed about the liver edge for the purposes of hydrodissection. Note, initial attempts proved unsuccessful including the administration of a small amount of air in the portal venous system, however ultimately, an adequate hydrodissection  was achieved about the liver edge. A post ablation contrast-enhanced CT scan was performed and the decision was made to perform an additional 3 minutes ablation. Following a total of a 10 minute ablation, tract ablation was performed as the microwave ablation probe was removed. Superficial hemostasis was achieved with manual compression. Noncontrast postprocedural imaging was obtained in the procedure was terminated. A dressing was placed. The patient tolerated the procedure well without immediate postprocedural complication. COMPLICATIONS: None immediate. FINDINGS: Preprocedural imaging demonstrates a ill-defined approximately 1.2 cm lesion within the  peripheral/subcapsular aspect of the dome of the right lobe of the liver (image 8, series 3). Under direct ultrasound guidance, a 22 gauge spinal needle was utilized for the purposes of procedural planning. Contrast-enhanced CT scan demonstrates the known ill-defined approximately 1.1 x 1.4 cm lesion about the cranial tip of the spinal needle (image 7, series 6). Under a combination of ultrasound and CT guidance, a XT probe was advanced into the ill-defined hepatic lesion. Following administration of hydrodissection, a 7 minute ablation cycle was performed. Contrast imaging suggested the ablation zone encompassed the location of the ill-defined hepatic lesion however as the lesion is very ill-defined, the decision was made to precede with an 3 additional minutes of ablation. Postprocedural noncontrast imaging demonstrates a technical success without evidence of complication. Specifically, no evidence of pneumothorax or significant perihepatic hemorrhage. IMPRESSION: Technically successful ultrasound an CT guided percutaneous thermal ablation of ill-defined lesion within the subcapsular aspect of the dome of the right lobe of the liver. PLAN: - The patient will be observed overnight. - The patient be seen in interventional radiology clinic in approximately 3-4 weeks. Pre appointment CMP level be obtained. No imaging is necessary for this initial postprocedural evaluation. - Initial surveillance imaging (likely with MRI) will be performed in 3 months (March 2018). Electronically Signed   By: Sandi Mariscal M.D.   On: 06/25/2016 17:18    Labs:  CBC:  Recent Labs  06/24/16 1103  WBC 6.2  HGB 13.8  HCT 39.9  PLT 94*    COAGS:  Recent Labs  06/24/16 1103  INR 1.17    BMP:  Recent Labs  06/24/16 1103 07/14/16 0944  NA 140 139  K 4.9 5.3  CL 103 103  CO2 29 30  GLUCOSE 93 150*  BUN 12 10  CALCIUM 9.6 9.1  CREATININE 1.00 0.93  GFRNONAA >60 80  GFRAA >60 >89    LIVER FUNCTION  TESTS:  Recent Labs  06/24/16 1103 07/14/16 0944  BILITOT 1.5* 1.1  AST 47* 60*  ALT 28 39  ALKPHOS 122 126*  PROT 7.7 6.7  ALBUMIN 4.9 4.2    TUMOR MARKERS:  AFP - 23 (05/10/2016)  Assessment and Plan:  KOBEY LICHTENFELS is a 76 y.o. male with past medical history sunitinib or hypertension, esophageal varices and alcoholic cirrhosis (patient has not consumed alcohol since 2011) who was found to have an indeterminate liver lesion worrisome for hepatocellular carcinoma and surveillance abdominal CT performed 06/17/2016 evaluation obtained for an elevated AFP of 23 (on 05/10/2016).  As such, patient underwent a technically successful and uncomplicated microwave ablation of the solitary hepatic lesion on 06/25/2016 and presents today to interventional radiology clinic for post procedural evaluation and management.   The patient has recovered fully from the procedure and is without complaint.  Selected images from preprocedural abdominal MRI performed 06/17/2016 as well as intraprocedural images during microwave ablation performed 06/25/2016 were reviewed in detail with the patient and the patient's wife.  -  Initial post procedural abdominal MRI will be performed in March 2018 at which time an AFP level will also be obtained (Note, surveillance imaging and AFP levels will be acquired on a every 3 months basis for at least the first year). - The patient will return to the interventional radiology clinic following the acquisition of this initial postprocedural surveillance MRI.   The patient was encouraged to call the interventional radiology clinic with any interval questions or concerns.  A copy of this report was sent to the requesting provider on this date.  Electronically Signed: Sandi Mariscal 07/21/2016, 1:54 PM   I spent a total of 25 Minutes in face to face in clinical consultation, greater than 50% of which was counseling/coordinating care for post hepatic microwave ablation.

## 2016-07-23 ENCOUNTER — Encounter: Payer: Self-pay | Admitting: Interventional Radiology

## 2016-09-29 DIAGNOSIS — C220 Liver cell carcinoma: Secondary | ICD-10-CM | POA: Diagnosis not present

## 2016-09-30 ENCOUNTER — Other Ambulatory Visit: Payer: Self-pay | Admitting: *Deleted

## 2016-09-30 ENCOUNTER — Other Ambulatory Visit (HOSPITAL_COMMUNITY): Payer: Self-pay | Admitting: Interventional Radiology

## 2016-09-30 DIAGNOSIS — K769 Liver disease, unspecified: Secondary | ICD-10-CM

## 2016-09-30 DIAGNOSIS — C220 Liver cell carcinoma: Secondary | ICD-10-CM

## 2016-10-04 DIAGNOSIS — K769 Liver disease, unspecified: Secondary | ICD-10-CM | POA: Diagnosis not present

## 2016-10-04 LAB — COMPREHENSIVE METABOLIC PANEL
ALT: 27 U/L (ref 9–46)
AST: 42 U/L — ABNORMAL HIGH (ref 10–35)
Albumin: 4.3 g/dL (ref 3.6–5.1)
Alkaline Phosphatase: 114 U/L (ref 40–115)
BUN: 10 mg/dL (ref 7–25)
CALCIUM: 9.6 mg/dL (ref 8.6–10.3)
CHLORIDE: 103 mmol/L (ref 98–110)
CO2: 30 mmol/L (ref 20–31)
Creat: 0.93 mg/dL (ref 0.70–1.18)
GLUCOSE: 98 mg/dL (ref 65–99)
POTASSIUM: 4.4 mmol/L (ref 3.5–5.3)
Sodium: 138 mmol/L (ref 135–146)
Total Bilirubin: 1.2 mg/dL (ref 0.2–1.2)
Total Protein: 6.5 g/dL (ref 6.1–8.1)

## 2016-10-04 LAB — PROTIME-INR
INR: 1.1
Prothrombin Time: 12.1 s — ABNORMAL HIGH (ref 9.0–11.5)

## 2016-10-05 ENCOUNTER — Other Ambulatory Visit (HOSPITAL_COMMUNITY): Payer: Self-pay | Admitting: Interventional Radiology

## 2016-10-05 ENCOUNTER — Other Ambulatory Visit: Payer: Self-pay | Admitting: General Surgery

## 2016-10-05 ENCOUNTER — Ambulatory Visit
Admission: RE | Admit: 2016-10-05 | Discharge: 2016-10-05 | Disposition: A | Discharge status: Home / Self Care | Payer: Self-pay | Source: Ambulatory Visit | Attending: General Surgery | Admitting: General Surgery

## 2016-10-05 DIAGNOSIS — C220 Liver cell carcinoma: Secondary | ICD-10-CM

## 2016-10-05 LAB — CBC
HCT: 35.2 % — ABNORMAL LOW (ref 38.5–50.0)
HEMOGLOBIN: 12.2 g/dL — AB (ref 13.2–17.1)
MCH: 31.9 pg (ref 27.0–33.0)
MCHC: 34.7 g/dL (ref 32.0–36.0)
MCV: 92.1 fL (ref 80.0–100.0)
MPV: 10.7 fL (ref 7.5–12.5)
Platelets: 74 10*3/uL — ABNORMAL LOW (ref 140–400)
RBC: 3.82 MIL/uL — AB (ref 4.20–5.80)
RDW: 14.5 % (ref 11.0–15.0)
WBC: 4.5 10*3/uL (ref 3.8–10.8)

## 2016-10-05 LAB — AFP TUMOR MARKER: AFP TUMOR MARKER: 12.4 ng/mL — AB (ref <6.1)

## 2016-10-12 ENCOUNTER — Other Ambulatory Visit: Payer: No Typology Code available for payment source

## 2016-10-26 ENCOUNTER — Ambulatory Visit
Admission: RE | Admit: 2016-10-26 | Discharge: 2016-10-26 | Disposition: A | Discharge status: Home / Self Care | Payer: No Typology Code available for payment source | Source: Ambulatory Visit | Attending: Interventional Radiology | Admitting: Interventional Radiology

## 2016-10-26 DIAGNOSIS — C220 Liver cell carcinoma: Secondary | ICD-10-CM

## 2016-10-26 HISTORY — PX: IR RADIOLOGIST EVAL & MGMT: IMG5224

## 2016-10-26 NOTE — Progress Notes (Signed)
Patient ID: Mike Ferguson, male   DOB: 03-15-1941, 76 y.o.   MRN: [id number]         Chief Complaint: Post procedural evaluation and management following hepatic microwave ablation.  Referring Physician(s): Byerly (Hepatobiliary Surgery) [name] (GI)  History of Present Illness: Mike Ferguson is a 76 y.o. male with past medical history significant for hypertension, esophageal varices and alcoholic cirrhosis (patient has not consumed alcohol since 2011) who was found to have an indeterminate liver lesion worrisome for hepatocellular carcinoma on surveillance abdominal MRI performed 05/17/2016 obtained for a elevated AFP of 23 (obtained on 05/10/2016). For this, the patient underwent a technically successful ultrasound and CT-guided percutaneous microwave ablation on 06/25/2016. Patient returns today for post procedural evaluation and management. He is accompanied by his wife though serves as his own historian.  Contrast enhanced abdominal MRI performed 09/21/2016 (obt[facility] at [facility]) was uploaded to our PACS and is available for review.  Patient does report transient right upper quadrant abdominal pain which he attributes to the ablation. Reports persistent fatigue though states this is unchanged since undergoing the procedure.   He is otherwise without complaint. No fever or chills. No yellowing of the skin or eyes. No increased abdominal girth. No confusion.  Past Medical History:  Diagnosis Date  . Arthritis    generalized-back  . Cirrhosis ([place])   . Cirrhosis, alcoholic ([place])   . Esophageal varices ([place])   . Hearing impaired person, bilateral    hearing aids bilaterally  . Hepatocellular carcinoma ([place])   . Hypertension     Past Surgical History:  Procedure Laterality Date  . HERNIA REPAIR     RIH  . IR GENERIC HISTORICAL  06/17/2016   IR RADIOLOGIST EVAL & MGMT 06/17/2016 Sandi Mariscal, MD [facility] INTERV RAD  . IR GENERIC HISTORICAL  07/21/2016   IR  RADIOLOGIST EVAL & MGMT 07/21/2016 Sandi Mariscal, MD [facility] INTERV RAD  . Left rotator cuff      Allergies: Erythromycin  Medications: Prior to Admission medications   Medication Sig Start Date End Date Taking? Authorizing Provider  cholecalciferol (VITAMIN D) 1000 units tablet Take 2,000 Units by mouth daily.   Yes Historical Provider, MD  loratadine-pseudoephedrine (CLARITIN-D 24-HOUR) 10-240 MG 24 hr tablet Take 1 tablet by mouth daily as needed for allergies.    Yes Historical Provider, MD  mirtazapine (REMERON) 15 MG tablet Take 15 mg by mouth at bedtime.   Yes Historical Provider, MD  propranolol (INDERAL) 20 MG tablet Take 20 mg by mouth 2 (two) times daily. 05/31/16  Yes Historical Provider, MD  rifaximin (XIFAXAN) 550 MG TABS tablet Take 550 mg by mouth 2 (two) times daily.   Yes Historical Provider, MD  sodium chloride (OCEAN) 0.65 % SOLN nasal spray Place 1 spray into both nostrils as needed for congestion.   Yes Historical Provider, MD  traMADol (ULTRAM) 50 MG tablet Take 50 mg by mouth 3 (three) times daily as needed for moderate pain.    Yes Historical Provider, MD  vitamin B-12 (CYANOCOBALAMIN) 100 MCG tablet Take 100 mcg by mouth daily.   Yes Historical Provider, MD  vitamin C (ASCORBIC ACID) 500 MG tablet Take 500 mg by mouth daily.    Historical Provider, MD     No family history on file.  Social History   Social History  . Marital status: Married    Spouse name: N/A  . Number of children: N/A  . Years of education: N/A  Social History Main Topics  . Smoking status: Former Smoker    Quit date: 06/24/1986  . Smokeless tobacco: Never Used  . Alcohol use No     Comment: 2011 Quit- ETOH abuse  . Drug use: No  . Sexual activity: Not Currently   Other Topics Concern  . Not on file   Social History Narrative  . No narrative on file    ECOG Status: 1 - Symptomatic but completely ambulatory  Review of Systems: A 12 point ROS discussed and pertinent positives are  indicated in the HPI above.  All other systems are negative.  Review of Systems  Constitutional: Positive for fatigue. Negative for activity change and appetite change.       Baseline fatigue, unchanged since undergoing the procedure.  Respiratory: Negative.   Cardiovascular: Negative.   Gastrointestinal: Positive for abdominal pain.       Transient intermittent right upper quadrant abdominal pain which he attributes to the ablation.  Skin: Negative.   Psychiatric/Behavioral: Negative.     Vital Signs: BP (!) 171/77 (BP Location: Left Arm, Patient Position: Sitting, Cuff Size: Normal)   Pulse (!) 49   Temp 97.8 F (36.6 C) (Oral)   Resp 15   Ht 5\' 5"  (1.651 m)   Wt 146 lb (66.2 kg)   SpO2 100%   BMI 24.30 kg/m   Physical Exam  Constitutional: He appears well-developed and well-nourished.  HENT:  Head: Normocephalic and atraumatic.  Eyes: Conjunctivae are normal.  Abdominal: There is tenderness. There is no guarding.  Well healed right upper quadrant ablation access site.  Skin: Skin is warm and dry.  Psychiatric: He has a normal mood and affect. His behavior is normal.  Nursing note and vitals reviewed.   Imaging:  Selected images from preprocedural abdominal MRI performed 06/17/2016 as well as post procedural abdominal MRI performed 09/21/2016 and intra-procedural images from hepatic microwave liver ablation performed 06/26/19/2017 were reviewed in detail with the patient and the patient's wife.  Post procedural MRI demonstrates a technically excellent result with ablation site encompassing the entirety of the ill-defined enhancing lesion within the subcapsular aspect of segment 8 of the right lobe of the liver. No evidence of residual nodular enhancement to suggest locally recurrent disease.   Note is made of a small (7 mm) area of restricted diffusion within segment 5 near the gallbladder fossa which warrants continued surveillance.   Labs:  CBC:  Recent Labs   06/24/16 1103 10/04/16 1049  WBC 6.2 4.5  HGB 13.8 12.2*  HCT 39.9 35.2*  PLT 94* 74*    COAGS:  Recent Labs  06/24/16 1103 10/04/16 1049  INR 1.17 1.1    BMP:  Recent Labs  06/24/16 1103 07/14/16 0944 10/04/16 1049  NA 140 139 138  K 4.9 5.3 4.4  CL 103 103 103  CO2 29 30 30   GLUCOSE 93 150* 98  BUN 12 10 10   CALCIUM 9.6 9.1 9.6  CREATININE 1.00 0.93 0.93  GFRNONAA >60 80  --   GFRAA >60 >89  --     LIVER FUNCTION TESTS:  Recent Labs  06/24/16 1103 07/14/16 0944 10/04/16 1049  BILITOT 1.5* 1.1 1.2  AST 47* 60* 42*  ALT 28 39 27  ALKPHOS 122 126* 114  PROT 7.7 6.7 6.5  ALBUMIN 4.9 4.2 4.3    TUMOR MARKERS:  Recent Labs  10/04/16 1049  AFPTM 12.4*   AFP - 23 (05/10/2016)  Assessment and Plan:  ADEEL GUIFFRE is  a 76 y.o. male with past medical history significant for hypertension, esophageal varices and alcoholic cirrhosis (patient has not consumed alcohol since 2011) who was found to have an indeterminate liver lesion worrisome for hepatocellular carcinoma on surveillance abdominal MRI performed 05/17/2016 for which the patient underwent a technically successful ultrasound and CT-guided percutaneous microwave ablation on 06/25/2016.  Post procedural MRI performed 09/21/2016 (obtained at the [facility]) demonstrates a technically excellent result with ablation site encompassing the entirety of the ill-defined enhancing lesion within the subcapsular aspect of segment 8 of the right lobe of the liver. No evidence of residual nodular enhancement to suggest locally recurrent disease.   This finding is associated with subsequent decrease in AFP level (12.4 on 09/24/2016 and 23 on 05/10/2016).  Note is made of a small (7 mm) area of restricted diffusion within segment 5 near the gallbladder fossa which warrants continued surveillance.    Selected images from preprocedural abdominal MRI performed 06/17/2016 as well as post procedural abdominal  MRI performed 09/21/2016 and intra-procedural images from hepatic microwave liver ablation performed 06/26/19/2017 were reviewed in detail with the patient and the patient's wife.  The patient will return to the interventional radiology clinic following the acquisition of surveillance MRI and repeat AFP level in 3 months (June 2018).  The patient and the patient's wife were encouraged to call the interventional radiology clinic with any interval questions or concerns.  A copy of this report was sent to the requesting provider on this date.  Electronically Signed: Sandi Mariscal 10/26/2016, 9:52 AM   I spent a total of 15 Minutes in face to face in clinical consultation, greater than 50% of which was counseling/coordinating care for hepatocellular carcinoma, post microwave ablation.

## 2016-12-07 ENCOUNTER — Other Ambulatory Visit: Payer: Self-pay | Admitting: Radiology

## 2016-12-07 ENCOUNTER — Other Ambulatory Visit (HOSPITAL_COMMUNITY): Payer: Self-pay | Admitting: Interventional Radiology

## 2016-12-07 DIAGNOSIS — C220 Liver cell carcinoma: Secondary | ICD-10-CM

## 2016-12-16 DIAGNOSIS — C220 Liver cell carcinoma: Secondary | ICD-10-CM | POA: Diagnosis not present

## 2016-12-17 LAB — COMPREHENSIVE METABOLIC PANEL
ALK PHOS: 99 U/L (ref 40–115)
ALT: 22 U/L (ref 9–46)
AST: 35 U/L (ref 10–35)
Albumin: 4.2 g/dL (ref 3.6–5.1)
BILIRUBIN TOTAL: 1.3 mg/dL — AB (ref 0.2–1.2)
BUN: 11 mg/dL (ref 7–25)
CALCIUM: 9 mg/dL (ref 8.6–10.3)
CO2: 29 mmol/L (ref 20–31)
Chloride: 99 mmol/L (ref 98–110)
Creat: 1.12 mg/dL (ref 0.70–1.18)
GLUCOSE: 77 mg/dL (ref 65–99)
POTASSIUM: 3.9 mmol/L (ref 3.5–5.3)
Sodium: 136 mmol/L (ref 135–146)
TOTAL PROTEIN: 6.4 g/dL (ref 6.1–8.1)

## 2016-12-17 LAB — AFP TUMOR MARKER: AFP TUMOR MARKER: 17 ng/mL — AB (ref <6.1)

## 2016-12-21 ENCOUNTER — Other Ambulatory Visit: Payer: Self-pay | Admitting: Family Medicine

## 2016-12-21 DIAGNOSIS — C220 Liver cell carcinoma: Secondary | ICD-10-CM

## 2016-12-22 ENCOUNTER — Ambulatory Visit (HOSPITAL_COMMUNITY)
Admission: RE | Admit: 2016-12-22 | Discharge: 2016-12-22 | Disposition: A | Discharge status: Home / Self Care | Payer: No Typology Code available for payment source | Source: Ambulatory Visit | Attending: Interventional Radiology | Admitting: Interventional Radiology

## 2016-12-22 ENCOUNTER — Other Ambulatory Visit: Payer: No Typology Code available for payment source

## 2016-12-22 ENCOUNTER — Encounter (HOSPITAL_COMMUNITY): Payer: Self-pay

## 2016-12-31 ENCOUNTER — Encounter: Payer: Self-pay | Admitting: Interventional Radiology

## 2017-01-11 ENCOUNTER — Other Ambulatory Visit (HOSPITAL_COMMUNITY): Payer: No Typology Code available for payment source

## 2017-01-11 ENCOUNTER — Ambulatory Visit
Admission: RE | Admit: 2017-01-11 | Discharge: 2017-01-11 | Disposition: A | Discharge status: Home / Self Care | Payer: No Typology Code available for payment source | Source: Ambulatory Visit | Attending: Family Medicine | Admitting: Family Medicine

## 2017-01-11 ENCOUNTER — Ambulatory Visit
Admission: RE | Admit: 2017-01-11 | Discharge: 2017-01-11 | Disposition: A | Discharge status: Home / Self Care | Payer: No Typology Code available for payment source | Source: Ambulatory Visit | Attending: Interventional Radiology | Admitting: Interventional Radiology

## 2017-01-11 DIAGNOSIS — C220 Liver cell carcinoma: Secondary | ICD-10-CM

## 2017-01-11 HISTORY — PX: IR RADIOLOGIST EVAL & MGMT: IMG5224

## 2017-01-11 MED ORDER — GADOXETATE DISODIUM 0.25 MMOL/ML IV SOLN
6.0000 mL | Freq: Once | INTRAVENOUS | Status: AC | PRN
  Administered 2017-01-11: 6 mL via INTRAVENOUS

## 2017-01-11 NOTE — Progress Notes (Signed)
Patient ID: Mike Ferguson, male   DOB: April 08, 1941, 76 y.o.   MRN: [id number]         Chief Complaint: [place], post hepatic microwave ablation  Referring Physician(s): Byerly (Hepatobiliary Surgery) [name] (GI)  History of Present Illness: Mike Ferguson is a 76 y.o. male with past history significant for hypertension, esophageal varices and alcoholic cirrhosis (patient has not consumed alcohol since 2011) who was found to have an indeterminate liver lesion worrisome for hepatocellular carcinoma on surveillance abdominal MRI performed 05/17/2016 obtained for an elevated AFP of 23 (obtained on 05/10/2016).  For this, the patient underwent a technically successful ultrasound and CT-guided precarious microwave hepatic ablation on 06/25/2016. He returns today for post procedural evaluation and management on the acquisition of seven-month surveillance MRI performed earlier today. He is again accompanied by his wife though serves as his own historian.  Patient continues to complain of transient right upper quadrant abdominal pain. Detailed questioning confirms that this nonspecific right upper abdominal quadrant pain preexisted the microwave ablation and is not changed in frequency or intensity following the procedure.   The patient is otherwise without complaint. No yellowing of the skin or eyes. No increased abdominal girth. No confusion.   Past Medical History:  Diagnosis Date  . Arthritis    generalized-back  . Cirrhosis ([place])   . Cirrhosis, alcoholic ([place])   . Esophageal varices ([place])   . Hearing impaired person, bilateral    hearing aids bilaterally  . Hepatocellular carcinoma ([place])   . Hypertension     Past Surgical History:  Procedure Laterality Date  . HERNIA REPAIR     RIH  . IR GENERIC HISTORICAL  06/17/2016   IR RADIOLOGIST EVAL & MGMT 06/17/2016 Mike Mariscal, MD [facility] INTERV RAD  . IR GENERIC HISTORICAL  07/21/2016   IR RADIOLOGIST EVAL & MGMT 07/21/2016 Mike Mariscal, MD [facility]  INTERV RAD  . IR RADIOLOGIST EVAL & MGMT  10/26/2016  . Left rotator cuff      Allergies: Erythromycin  Medications: Prior to Admission medications   Medication Sig Start Date End Date Taking? Authorizing Provider  cholecalciferol (VITAMIN D) 1000 units tablet Take 2,000 Units by mouth daily.   Yes [provider]  loratadine-pseudoephedrine (CLARITIN-D 24-HOUR) 10-240 MG 24 hr tablet Take 1 tablet by mouth daily as needed for allergies.    Yes [provider]  propranolol (INDERAL) 20 MG tablet Take 20 mg by mouth 2 (two) times daily. 05/31/16  Yes [provider]  rifaximin (XIFAXAN) 550 MG TABS tablet Take 550 mg by mouth 2 (two) times daily.   Yes [provider]  sodium chloride (OCEAN) 0.65 % SOLN nasal spray Place 1 spray into both nostrils as needed for congestion.   Yes [provider]  traMADol (ULTRAM) 50 MG tablet Take 50 mg by mouth 3 (three) times daily as needed for moderate pain.    Yes [provider]  traMADol (ULTRAM-ER) 100 MG 24 hr tablet Take 100 mg by mouth daily.   Yes [provider]  vitamin B-12 (CYANOCOBALAMIN) 100 MCG tablet Take 100 mcg by mouth daily.   Yes [provider]  vitamin C (ASCORBIC ACID) 500 MG tablet Take 500 mg by mouth daily.   Yes [provider]  mirtazapine (REMERON) 15 MG tablet Take 15 mg by mouth at bedtime.    [provider]     No family history on file.  Social History   Social History  . Marital status:  Married    Spouse name: N/A  . Number of children: N/A  . Years of education: N/A   Social History Main Topics  . Smoking status: Former Smoker    Quit date: 06/24/1986  . Smokeless tobacco: Never Used  . Alcohol use No     Comment: 2011 Quit- ETOH abuse  . Drug use: No  . Sexual activity: Not Currently   Other Topics Concern  . Not on file   Social History Narrative  . No narrative on file    ECOG Status: 1 - Symptomatic but  completely ambulatory  Review of Systems: A 12 point ROS discussed and pertinent positives are indicated in the HPI above.  All other systems are negative.  Review of Systems  Constitutional: Negative for activity change and appetite change.  Eyes: Negative.   Respiratory: Negative.   Cardiovascular: Negative.   Gastrointestinal: Positive for abdominal pain.       Persistent transient right upper quadrant abdominal pain which preexisted the ablation and has not worsened in severity or frequency.  Skin: Negative.     Vital Signs: BP (!) 174/83   Pulse (!) 50   Temp 97.5 F (36.4 C) (Oral)   Resp 14   Ht 5\' 5"  (1.651 m)   SpO2 100%   Physical Exam  Constitutional: He appears well-developed and well-nourished.  HENT:  Head: Normocephalic and atraumatic.  Eyes: Conjunctivae are normal.  Skin: Skin is warm and dry.  Psychiatric: He has a normal mood and affect. His behavior is normal.     Imaging:  Selected images from preprocedural abdominal MRI performed 05/17/2016; post procedural abdominal MRI performed 01/11/2017 and ultrasound/CT-guided microwave hepatic oblation performed 06/25/2016 reviewed in detail with the patient and the patient's wife.  Mike Ferguson W Wo Contrast  Result Date: 01/11/2017 CLINICAL DATA:  Status post thermal ablation of segment 4A left liver lobe hepatocellular carcinoma on 06/25/2016, presenting for six-month follow-up. EXAM: MRI Ferguson WITHOUT AND WITH CONTRAST TECHNIQUE: Multiplanar multisequence Mike imaging of the Ferguson was performed both before and after the administration of intravenous contrast. CONTRAST:  60mL EOVIST GADOXETATE DISODIUM 0.25 MOL/L IV SOLN COMPARISON:  Outside MRI Ferguson from 09/21/2016. 06/17/2016 MRI Ferguson. FINDINGS: Lower chest: No acute abnormality at the lung bases. Hepatobiliary: Diffuse liver surface irregularity with slight relative hypertrophy of the lateral segment left liver lobe, compatible with cirrhosis. No hepatic  steatosis. Liver lesions as follows: - far inferior right liver lobe 0.9 cm liver mass (series 22/image 45) with probable mild arterial phase hyperenhancement (better seen on outside 09/21/2016 MRI arterial phase sequence), portal venous phase washout and hepatobiliary phase hypointensity, increased from 0.6 cm on 09/21/2016 outside MRI and not seen on the 06/17/2016 MRI, compatible with LI-RADS category 4 lesion (probably [place]) - ablation zone at the segment 4A treated left liver lobe lesion measures 2.5 x 2.3 cm (series 6/image 35), with irregular eccentric peripheral arterial phase hyperenhancement at the right lateral margin of the ablated lesion measuring up to 1.4 x 0.5 cm (series 9/image 26) with associated hepatobiliary phase hypointensity in this location (series 22/image 27), which appears increased from 0.9 x 0.4 cm on the outside 09/21/2016 MRI study, suspicious for local tumor recurrence Normal gallbladder with no cholelithiasis. No biliary ductal dilatation. Common bile duct diameter 2 mm. No choledocholithiasis. Pancreas: No pancreatic mass or duct dilation.  No pancreas divisum. Spleen: Normal size. No mass. Adrenals/Urinary Tract: Normal adrenals. No hydronephrosis. Normal kidneys with no renal mass. Stomach/Bowel: Grossly normal stomach. Visualized  small and large bowel is normal caliber, with no bowel wall thickening. Vascular/Lymphatic: Normal caliber abdominal aorta. Patent portal, splenic, hepatic and renal veins. No pathologically enlarged lymph nodes in the Ferguson. Other: No abdominal ascites or focal fluid collection. Musculoskeletal: No aggressive appearing focal osseous lesions. IMPRESSION: 1. Irregular eccentric peripheral hyperenhancement at the right lateral margin of the ablated segment 4A left liver lobe tumor, increased since the outside 09/21/2016 MRI study, suspicious for local tumor recurrence . 2. Far inferior right liver lobe 0.9 cm mass with MRI features compatible with LI-RADS  category 4 lesion (probably [place]), new since 06/17/2016 MRI, increased in size since 09/21/2016 MRI. 3. Cirrhosis.  No ascites.  Normal size spleen. 4. No adenopathy or other findings of metastatic disease in the Ferguson. These results were called by telephone at the time of interpretation on 01/11/2017 at 3:03 pm to Dr. Ronny Bacon, who verbally acknowledged these results. Electronically Signed   By: Ilona Sorrel M.D.   On: 01/11/2017 15:27    Labs:  CBC:  Recent Labs  06/24/16 1103 10/04/16 1049  WBC 6.2 4.5  HGB 13.8 12.2*  HCT 39.9 35.2*  PLT 94* 74*    COAGS:  Recent Labs  06/24/16 1103 10/04/16 1049  INR 1.17 1.1    BMP:  Recent Labs  06/24/16 1103 07/14/16 0944 10/04/16 1049 12/16/16 1015  NA 140 139 138 136  K 4.9 5.3 4.4 3.9  CL 103 103 103 99  CO2 29 30 30 29   GLUCOSE 93 150* 98 77  BUN 12 10 10 11   CALCIUM 9.6 9.1 9.6 9.0  CREATININE 1.00 0.93 0.93 1.12  GFRNONAA >60 80  --   --   GFRAA >60 >89  --   --     LIVER FUNCTION TESTS:  Recent Labs  06/24/16 1103 07/14/16 0944 10/04/16 1049 12/16/16 1015  BILITOT 1.5* 1.1 1.2 1.3*  AST 47* 60* 42* 35  ALT 28 39 27 22  ALKPHOS 122 126* 114 99  PROT 7.7 6.7 6.5 6.4  ALBUMIN 4.9 4.2 4.3 4.2    TUMOR MARKERS:  Recent Labs  10/04/16 1049 12/16/16 1015  AFPTM 12.4* 17.0*    Assessment and Plan:  RYKIN ROUTE is a 76 y.o. male with past history significant for hypertension, esophageal varices and alcoholic cirrhosis (patient has not consumed alcohol since 2011) who was found to have an indeterminate liver lesion worrisome for hepatocellular carcinoma on surveillance abdominal MRI performed 05/17/2016 obtained for an elevated AFP of 23 (obtained on 05/10/2016).  For this, the patient underwent a technically successful ultrasound and CT-guided precarious microwave hepatic ablation on 06/25/2016. He returns today for post procedural evaluation and management on the acquisition of seven-month  surveillance MRI performed earlier today.   Unfortunately, today's abdominal MRI demonstrated findings worrisome for residual enhancing disease about the superior and lateral border/margin of the ablation zone.   Additionally, there is a tiny (approximately 0.9 cm) lesion within the caudal most aspect of the right lobe the liver which is worrisome for an additional site of hepatocellular carcinoma. These findings are associated with a elevation of the AFP (12.4 on 10/04/2016 -> 17.0 on 12/16/2016).  Selected images from preprocedural abdominal MRI performed 05/17/2016; post procedural abdominal MRI performed 01/11/2017 and ultrasound/CT-guided microwave hepatic ablation performed 06/25/2016 were reviewed in detail with the patient and the patient's wife.  Potential treatment options were discussed at length with the patient and the patient's wife including:  #1 - Continued observation with surveillance  abdominal MRI in 3 months (late September of this year) with repeat AFP level at that time.  #2 - Proceeding with attempted microwave ablation of areas of residual disease about the ablation zone and additional area within the caudal aspect the right lobe of the liver. I explained that repeat ablation may prove difficult secondary to the cranial location of the residual disease adjacent to the diaphragm. I also explained that it may be difficult to localize the tiny lesion within the caudal aspect the right lobe of the liver given it's size and presumed inconspicuity on ultrasound and/or CT.  #3 - Proceeding with bland hepatic arterial embolization. I explained that arterial embolization would be performed in hopes of palliation would not be performed with curative intent.  #4 - Referral to Dr. Barry Dienes for evaluation for definitive surgical resection.  Note, at this point, the patient does not wish to pursue hepatic resection.  Following this prolonged and detailed conversation, the patient and patient's  wife wish to pursue continued conservative management.  As such, patient will be seen in follow-up consultation following the acquisition of abdominal MRI and AFP level in late September of this year.  The patient was encouraged to call the interventional radiology clinic with any interval questions or concerns.  A copy of this report was sent to the requesting provider on this date.  Electronically Signed: Sandi Ferguson 01/11/2017, 5:58 PM   I spent a total of 25 Minutes in face to face in clinical consultation, greater than 50% of which was counseling/coordinating care for posthepatic microwave ablation

## 2017-01-31 DIAGNOSIS — Z125 Encounter for screening for malignant neoplasm of prostate: Secondary | ICD-10-CM | POA: Diagnosis not present

## 2017-01-31 DIAGNOSIS — E7800 Pure hypercholesterolemia, unspecified: Secondary | ICD-10-CM | POA: Diagnosis not present

## 2017-01-31 DIAGNOSIS — K766 Portal hypertension: Secondary | ICD-10-CM | POA: Diagnosis not present

## 2017-02-03 DIAGNOSIS — R634 Abnormal weight loss: Secondary | ICD-10-CM | POA: Diagnosis not present

## 2017-02-03 DIAGNOSIS — Z0000 Encounter for general adult medical examination without abnormal findings: Secondary | ICD-10-CM | POA: Diagnosis not present

## 2017-02-03 DIAGNOSIS — C220 Liver cell carcinoma: Secondary | ICD-10-CM | POA: Diagnosis not present

## 2017-02-03 DIAGNOSIS — K766 Portal hypertension: Secondary | ICD-10-CM | POA: Diagnosis not present

## 2017-02-14 DIAGNOSIS — K7030 Alcoholic cirrhosis of liver without ascites: Secondary | ICD-10-CM | POA: Diagnosis not present

## 2017-02-14 DIAGNOSIS — C220 Liver cell carcinoma: Secondary | ICD-10-CM | POA: Diagnosis not present

## 2017-03-10 ENCOUNTER — Other Ambulatory Visit (HOSPITAL_COMMUNITY): Payer: Self-pay | Admitting: Interventional Radiology

## 2017-03-10 ENCOUNTER — Other Ambulatory Visit: Payer: Self-pay | Admitting: Family Medicine

## 2017-03-10 ENCOUNTER — Encounter: Payer: Self-pay | Admitting: Radiology

## 2017-03-10 ENCOUNTER — Other Ambulatory Visit: Payer: Self-pay | Admitting: Radiology

## 2017-03-10 DIAGNOSIS — C220 Liver cell carcinoma: Secondary | ICD-10-CM

## 2017-03-31 ENCOUNTER — Other Ambulatory Visit: Payer: Self-pay | Admitting: *Deleted

## 2017-03-31 DIAGNOSIS — C220 Liver cell carcinoma: Secondary | ICD-10-CM

## 2017-04-05 ENCOUNTER — Other Ambulatory Visit: Payer: No Typology Code available for payment source

## 2017-04-05 ENCOUNTER — Ambulatory Visit
Admission: RE | Admit: 2017-04-05 | Discharge: 2017-04-05 | Disposition: A | Discharge status: Home / Self Care | Payer: No Typology Code available for payment source | Source: Ambulatory Visit | Attending: Interventional Radiology | Admitting: Interventional Radiology

## 2017-04-05 ENCOUNTER — Ambulatory Visit
Admission: RE | Admit: 2017-04-05 | Discharge: 2017-04-05 | Disposition: A | Discharge status: Home / Self Care | Payer: No Typology Code available for payment source | Source: Ambulatory Visit | Attending: Family Medicine | Admitting: Family Medicine

## 2017-04-05 DIAGNOSIS — C220 Liver cell carcinoma: Secondary | ICD-10-CM

## 2017-04-05 HISTORY — PX: IR RADIOLOGIST EVAL & MGMT: IMG5224

## 2017-04-05 MED ORDER — GADOBENATE DIMEGLUMINE 529 MG/ML IV SOLN
13.0000 mL | Freq: Once | INTRAVENOUS | Status: AC | PRN
  Administered 2017-04-05: 13 mL via INTRAVENOUS

## 2017-04-05 NOTE — Progress Notes (Signed)
Chief Complaint: Patient was seen in consultation today for  Chief Complaint  Patient presents with  . Follow-up    9 mo follow up Thermal Ablation of Liver    Referring Physician(s): Byerly (Hepatobiliary Surgery) [name] (GI)  Supervising Physician: Sandi Mariscal  History of Present Illness: Mike Ferguson is a 76 y.o. male with past history significant for hypertension, esophageal varices and alcoholic cirrhosis (patient has not consumed alcohol since 2011) who was found to have an indeterminate liver lesion worrisome for hepatocellular carcinoma on surveillance abdominal MRI performed 05/17/2016 obtained for an elevated AFP of 23 (obtained on 05/10/2016).   He underwent a technically successful ultrasound and CT-guided precarious microwave hepatic ablation on 06/25/2016.   He returns today for his 9 month follow up.  He is accompanied by his wife.  Patient continues to complain of transient right upper quadrant abdominal pain.  He has complained of this at previous visits and per the note, detailed questioning confirms that this nonspecific right upper abdominal quadrant pain preexisted the microwave ablation and is not changed in frequency or intensity following the procedure.   The patient is otherwise without complaint. No yellowing of the skin or eyes. No increased abdominal girth.   He denies any confusion but his wife says sometimes he is.  He also c/o he has lost his appetite. He says "food just doesn't look good to me, I don't want anything".  His wife states that he will eat fried popcorn shrimp, which is his favorite.  He also c/o some abdominal fullness.  T[facility] markers done at the [facility] =  AFP 12.4 in March 17.0 in May 62.6 in June 34.4 September 10   Past Medical History:  Diagnosis Date  . Arthritis    generalized-back  . Cirrhosis ([place])   . Cirrhosis, alcoholic ([place])   . Esophageal varices ([place])   . Hearing impaired person, bilateral    hearing aids bilaterally  . Hepatocellular carcinoma ([place])   . Hypertension     Past Surgical History:  Procedure Laterality Date  . HERNIA REPAIR     RIH  . IR GENERIC HISTORICAL  06/17/2016   IR RADIOLOGIST EVAL & MGMT 06/17/2016 Sandi Mariscal, MD [facility] INTERV RAD  . IR GENERIC HISTORICAL  07/21/2016   IR RADIOLOGIST EVAL & MGMT 07/21/2016 Sandi Mariscal, MD [facility] INTERV RAD  . IR RADIOLOGIST EVAL & MGMT  10/26/2016  . Left rotator cuff      Allergies: Erythromycin  Medications: Prior to Admission medications   Medication Sig Start Date End Date Taking? Authorizing Provider  cholecalciferol (VITAMIN D) 1000 units tablet Take 2,000 Units by mouth daily.   Yes [provider]  loratadine-pseudoephedrine (CLARITIN-D 24-HOUR) 10-240 MG 24 hr tablet Take 1 tablet by mouth daily as needed for allergies.    Yes [provider]  mirtazapine (REMERON) 15 MG tablet Take 15 mg by mouth at bedtime.   Yes [provider]  propranolol (INDERAL) 20 MG tablet Take 20 mg by mouth 2 (two) times daily. 05/31/16  Yes [provider]  rifaximin (XIFAXAN) 550 MG TABS tablet Take 550 mg by mouth 2 (two) times daily.   Yes [provider]  sodium chloride (OCEAN) 0.65 % SOLN nasal spray Place 1 spray into both nostrils as needed for congestion.   Yes [provider]  traMADol (ULTRAM) 50 MG tablet Take 50 mg by mouth 3 (three) times daily as needed for moderate pain.    Yes [provider]  traMADol (ULTRAM-ER) 100 MG 24 hr tablet Take 100 mg by mouth daily.   Yes [provider]  vitamin B-12 (CYANOCOBALAMIN) 100 MCG tablet Take 100 mcg by mouth daily.   Yes [provider]  vitamin C (ASCORBIC ACID) 500 MG tablet Take 500 mg by mouth daily.   Yes [provider]     No family history on file.  Social History   Social History  . Marital status: Married    Spouse name: N/A  . Number of children: N/A  . Years of  education: N/A   Social History Main Topics  . Smoking status: Former Smoker    Quit date: 06/24/1986  . Smokeless tobacco: Never Used  . Alcohol use No     Comment: 2011 Quit- ETOH abuse  . Drug use: No  . Sexual activity: Not Currently   Other Topics Concern  . Not on file   Social History Narrative  . No narrative on file   Review of Systems: A 12 point ROS discussed  Review of Systems  Constitutional: Positive for appetite change. Negative for fatigue, fever and unexpected weight change.  HENT: Negative.   Respiratory: Negative.   Cardiovascular: Negative.   Gastrointestinal: Positive for abdominal distention and abdominal pain.  Genitourinary: Negative.   Musculoskeletal: Negative.   Skin: Negative.   Neurological: Negative.   Hematological: Negative.   Psychiatric/Behavioral: Negative.     Vital Signs: BP (!) 163/61   Pulse (!) 53   Temp 97.6 F (36.4 C) (Oral)   Resp 14   Ht 5\' 5"  (1.651 m)   Wt 143 lb (64.9 kg)   SpO2 100%   BMI 23.80 kg/m   Physical Exam  Constitutional: He appears well-developed.  HENT:  Head: Normocephalic and atraumatic.  Eyes: EOM are normal.  Neck: Normal range of motion.  Cardiovascular: Normal rate, regular rhythm and normal heart sounds.   Pulmonary/Chest: Effort normal and breath sounds normal. No respiratory distress. He has no wheezes.  Abdominal: He exhibits distension. There is no tenderness.  Musculoskeletal: Normal range of motion.  Neurological: He is alert.  Skin: Skin is warm and dry.  Psychiatric: He has a normal mood and affect. His behavior is normal. Judgment and thought content normal.  Vitals reviewed.   Imaging: [name] Contrast  Result Date: 04/05/2017 CLINICAL DATA:  Followup hepatocellular carcinoma status post microwave ablation in 2017. EXAM: MRI ABDOMEN WITHOUT AND WITH CONTRAST TECHNIQUE: Multiplanar multisequence MR imaging of the abdomen was performed both before and after the  administration of intravenous contrast. CONTRAST:  42mL MULTIHANCE GADOBENATE DIMEGLUMINE 529 MG/ML IV SOLN COMPARISON:  MRI 01/11/2017 FINDINGS: Lower chest: No worrisome pulmonary lesions. No pleural or pericardial effusion. Hepatobiliary: Persistent and slightly progressive early arterial phase enhancement along the superior, lateral and inferior margin of the ablation site in segment 4A. The largest area of enhancement measures 15 x 6 mm and previously measured 13.5 x 5 mm. Inferiorly the nodular enhancement is 11 mm and was previously 9.5 mm. Findings certainly suspicious for slightly progressive recurrent tumor. The was a small focus of enhancement in segment 6 of the liver. This is equivocal on today's examination. New small focus of early arterial phase enhancement near the IVC in segment 4A measuring 6.5 mm (series 11, image 31 another small focus of enhancement is noted in the right hepatic lobe on image number 43 of series 11. This measures 8 mm. In retrospect I think this was present  on the prior study and is stable. These could reflect small dysplastic nodules or small HCC's. The gallbladder is unremarkable. No common bile duct dilatation. No intrahepatic biliary dilatation. Stable cirrhotic changes involving the liver. Pancreas:  No mass, inflammation or ductal dilatation. Spleen:  Mild splenomegaly.  No focal lesions. Adrenals/Urinary Tract: The adrenal glands and kidneys are unremarkable and stable. Stomach/Bowel: Visualized portions within the abdomen are unremarkable. Vascular/Lymphatic: No pathologically enlarged lymph nodes identified. No abdominal aortic aneurysm demonstrated. Portal venous hypertension with portal venous collaterals and esophageal varices. Other:  No ascites or abdominal wall hernia. Musculoskeletal: No significant bony findings. IMPRESSION: 1. Persistent and slightly progressive areas of peripheral enhancement around the ablated lesion in segment 4A of the liver. This is most  likely recurrent tumor. 2. The small suspected lesion in segment 6 of liver on the prior MRI is not definitely identified on today's study. 3. New enhancing lesion in segment 4A near the IVC could be a dysplastic nodule or small HCC. 4. Small enhancing lesion in segment 6 as discussed above. I think this was present on the prior study and is stable. 5. Stable changes of cirrhosis with portal venous hypertension and esophageal varices. Electronically Signed   By: Marijo Sanes M.D.   On: 04/05/2017 12:06    Labs:  CBC:  Recent Labs  06/24/16 1103 10/04/16 1049  WBC 6.2 4.5  HGB 13.8 12.2*  HCT 39.9 35.2*  PLT 94* 74*    COAGS:  Recent Labs  06/24/16 1103 10/04/16 1049  INR 1.17 1.1    BMP:  Recent Labs  06/24/16 1103 07/14/16 0944 10/04/16 1049 12/16/16 1015  NA 140 139 138 136  K 4.9 5.3 4.4 3.9  CL 103 103 103 99  CO2 29 30 30 29   GLUCOSE 93 150* 98 77  BUN 12 10 10 11   CALCIUM 9.6 9.1 9.6 9.0  CREATININE 1.00 0.93 0.93 1.12  GFRNONAA >60 80  --   --   GFRAA >60 >89  --   --     LIVER FUNCTION TESTS:  Recent Labs  06/24/16 1103 07/14/16 0944 10/04/16 1049 12/16/16 1015  BILITOT 1.5* 1.1 1.2 1.3*  AST 47* 60* 42* 35  ALT 28 39 27 22  ALKPHOS 122 126* 114 99  PROT 7.7 6.7 6.5 6.4  ALBUMIN 4.9 4.2 4.3 4.2    TUMOR MARKERS:  Recent Labs  10/04/16 1049 12/16/16 1015  AFPTM 12.4* 17.0*   AFP 12.4 in March 17.0 in May 62.6 in June 34.4 September 10    Assessment:  S/P CT-guided precarious microwave hepatic ablation on 06/25/2016.  MRI done today shows a persistent and slightly progressive areas of peripheral enhancement around the ablated lesion in segment 4A of the liver. This is most likely recurrent tumor.   Electronically Signed: Murrell Redden PA-C 04/05/2017, 1:48 PM   Please refer to Dr. Deniece Portela attestation of this note for management and plan.

## 2017-04-18 DIAGNOSIS — Z23 Encounter for immunization: Secondary | ICD-10-CM | POA: Diagnosis not present

## 2017-05-02 ENCOUNTER — Encounter: Payer: Self-pay | Admitting: Interventional Radiology

## 2017-05-26 ENCOUNTER — Encounter: Payer: Self-pay | Admitting: Interventional Radiology

## 2017-06-02 ENCOUNTER — Other Ambulatory Visit: Payer: Self-pay | Admitting: *Deleted

## 2017-06-02 ENCOUNTER — Other Ambulatory Visit: Payer: Self-pay | Admitting: Family Medicine

## 2017-06-02 ENCOUNTER — Other Ambulatory Visit (HOSPITAL_COMMUNITY): Payer: Self-pay | Admitting: Interventional Radiology

## 2017-06-02 DIAGNOSIS — C220 Liver cell carcinoma: Secondary | ICD-10-CM

## 2017-06-14 DIAGNOSIS — H2513 Age-related nuclear cataract, bilateral: Secondary | ICD-10-CM | POA: Diagnosis not present

## 2017-06-28 ENCOUNTER — Ambulatory Visit
Admission: RE | Admit: 2017-06-28 | Discharge: 2017-06-28 | Disposition: A | Discharge status: Home / Self Care | Payer: No Typology Code available for payment source | Source: Ambulatory Visit | Attending: Family Medicine | Admitting: Family Medicine

## 2017-06-28 ENCOUNTER — Ambulatory Visit
Admission: RE | Admit: 2017-06-28 | Discharge: 2017-06-28 | Disposition: A | Discharge status: Home / Self Care | Payer: No Typology Code available for payment source | Source: Ambulatory Visit | Attending: Interventional Radiology | Admitting: Interventional Radiology

## 2017-06-28 ENCOUNTER — Other Ambulatory Visit: Payer: Self-pay | Admitting: Family Medicine

## 2017-06-28 DIAGNOSIS — Z9889 Other specified postprocedural states: Secondary | ICD-10-CM | POA: Diagnosis not present

## 2017-06-28 DIAGNOSIS — C220 Liver cell carcinoma: Secondary | ICD-10-CM

## 2017-06-28 DIAGNOSIS — R932 Abnormal findings on diagnostic imaging of liver and biliary tract: Secondary | ICD-10-CM | POA: Diagnosis not present

## 2017-06-28 DIAGNOSIS — R220 Localized swelling, mass and lump, head: Secondary | ICD-10-CM | POA: Diagnosis not present

## 2017-06-28 DIAGNOSIS — Z712 Person consulting for explanation of examination or test findings: Secondary | ICD-10-CM | POA: Diagnosis not present

## 2017-06-28 HISTORY — PX: IR RADIOLOGIST EVAL & MGMT: IMG5224

## 2017-06-28 MED ORDER — GADOBENATE DIMEGLUMINE 529 MG/ML IV SOLN
13.0000 mL | Freq: Once | INTRAVENOUS | Status: AC | PRN
  Administered 2017-06-28: 13 mL via INTRAVENOUS

## 2017-06-28 NOTE — Progress Notes (Signed)
Patient ID: Mike Ferguson, male   DOB: Dec 06, 1940, 76 y.o.   MRN: [id number]         Chief Complaint: [place], post ablation  Referring Physician(s): [name] (GI)  History of Present Illness: Mike Ferguson is a 76 y.o. male with past medical history significant for hypertension, esophageal varices and alcoholic cirrhosis (patient has not consumed alcohol since 2011) who returns today for consultation following microwave ablation of worrisome hepatic lesion performed 06/25/2016. Subsequent surveillance imaging demonstrated findings worrisome for residual disease at the ablation site as well as 2 indeterminate enhancing foci and as such, the decision was made to proceed with conservative management and observation.   Patient returns today to interventional radiology clinic to discuss the results following surveillance abdominal MRI performed earlier today as well as review of AFP level obtained at the [facility] on December 4.   The patient is again accompanied by his wife though serves as his own historian  Patient remains in his baseline state of well health. He admits to shoveling his entire driveway yesterday following over 1 foot of snowfall.  Patient denies chest pain or shortness of breath. No unintentional weight loss or weight gain. No yellowing of the skin or eyes. No change in mental status.  Past Medical History:  Diagnosis Date  . Arthritis    generalized-back  . Cirrhosis ([place])   . Cirrhosis, alcoholic ([place])   . Esophageal varices ([place])   . Hearing impaired person, bilateral    hearing aids bilaterally  . Hepatocellular carcinoma ([place])   . Hypertension     Past Surgical History:  Procedure Laterality Date  . HERNIA REPAIR     RIH  . IR GENERIC HISTORICAL  06/17/2016   IR RADIOLOGIST EVAL & MGMT 06/17/2016 Sandi Mariscal, MD [facility] INTERV RAD  . IR GENERIC HISTORICAL  07/21/2016   IR RADIOLOGIST EVAL & MGMT 07/21/2016 Sandi Mariscal, MD [facility] INTERV RAD  . IR  RADIOLOGIST EVAL & MGMT  10/26/2016  . IR RADIOLOGIST EVAL & MGMT  04/05/2017  . IR RADIOLOGIST EVAL & MGMT  01/11/2017  . IR RADIOLOGIST EVAL & MGMT  06/28/2017  . Left rotator cuff      Allergies: Erythromycin  Medications: Prior to Admission medications   Medication Sig Start Date End Date Taking? Authorizing Provider  cholecalciferol (VITAMIN D) 1000 units tablet Take 2,000 Units by mouth daily.   Yes [provider]  loratadine-pseudoephedrine (CLARITIN-D 24-HOUR) 10-240 MG 24 hr tablet Take 1 tablet by mouth daily as needed for allergies.    Yes [provider]  mirtazapine (REMERON) 15 MG tablet Take 15 mg by mouth at bedtime.   Yes [provider]  propranolol (INDERAL) 20 MG tablet Take 20 mg by mouth 2 (two) times daily. 05/31/16  Yes [provider]  rifaximin (XIFAXAN) 550 MG TABS tablet Take 550 mg by mouth 2 (two) times daily.   Yes [provider]  sodium chloride (OCEAN) 0.65 % SOLN nasal spray Place 1 spray into both nostrils as needed for congestion.   Yes [provider]  traMADol (ULTRAM-ER) 100 MG 24 hr tablet Take 100 mg by mouth daily.   Yes [provider]  vitamin B-12 (CYANOCOBALAMIN) 100 MCG tablet Take 100 mcg by mouth daily.   Yes [provider]  vitamin C (ASCORBIC ACID) 500 MG tablet Take 500 mg by mouth daily.   Yes [provider]     No family history on file.  Social History   Socioeconomic History  . Marital status: Married    Spouse name: Not on file  . Number of children: Not on file  . Years of education: Not on file  . Highest education level: Not on file  Social Needs  . Financial resource strain: Not on file  . Food insecurity - worry: Not on file  . Food insecurity - inability: Not on file  . Transportation needs - medical: Not on file  . Transportation needs - non-medical: Not on file  Occupational History  . Not on file  Tobacco Use  . Smoking status:  Former Smoker    Last attempt to quit: 06/24/1986    Years since quitting: 31.0  . Smokeless tobacco: Never Used  Substance and Sexual Activity  . Alcohol use: No    Comment: 2011 Quit- ETOH abuse  . Drug use: No  . Sexual activity: Not Currently  Other Topics Concern  . Not on file  Social History Narrative  . Not on file    ECOG Status: 0 - Asymptomatic  Review of Systems: A 12 point ROS discussed and pertinent positives are indicated in the HPI above.  All other systems are negative.  Review of Systems  Eyes: Negative.   Respiratory: Negative.   Cardiovascular: Negative.   Gastrointestinal: Negative.   Musculoskeletal: Positive for back pain.  Skin: Negative for color change.    Vital Signs: BP (!) 143/72 (BP Location: Right Arm, Patient Position: Sitting, Cuff Size: Normal)   Pulse (!) 54   Temp 97.7 F (36.5 C)   Resp 18   SpO2 100%   Physical Exam  Imaging:  Selected images from abdominal MRI performed earlier today as well as 04/05/2017 were reviewed in detail with the patient and the patient's wife.  [name] Contrast  Result Date: 06/28/2017 CLINICAL DATA:  76 year old male with history hepatocellular carcinoma diagnosed 1 year ago. History of cirrhosis. EXAM: MRI ABDOMEN WITHOUT AND WITH CONTRAST TECHNIQUE: Multiplanar multisequence MR imaging of the abdomen was performed both before and after the administration of intravenous contrast. CONTRAST:  59mL MULTIHANCE GADOBENATE DIMEGLUMINE 529 MG/ML IV SOLN COMPARISON:  MRI of the abdomen 04/05/2017. FINDINGS: Lower chest: Unremarkable. Hepatobiliary: Liver has a shrunken appearance and nodular contour compatible with underlying cirrhosis. There is a lace-like network of T2 hyperintensity throughout the hepatic parenchyma, compatible with developing areas of mild hepatic fibrosis. The previously ablated lesion in the periphery of segment 4A continues to be predominantly T1 hyperintense, likely reflecting  residual blood products and/or high proteinaceous content. Importantly, there are areas along the periphery of the lesion, most notable laterally (axial image 30 of series 11), and inferiorly (axial image 36 of series 11) which demonstrate increasingly nodular hyperenhancement on arterial phase post gadolinium images with lesions measuring 19 x 11 mm and 14 x 13 mm respectively in these locations. Other previously noted focus of arterial phase hyperenhancement in the central aspect of segment 4A adjacent to the falciform ligament (axial image 29 of series 11) also appears slightly larger than the prior examination measuring 10 mm on today's study (previously only 6 mm). The other previously noted lesion in segment 6 of the liver also appears slightly larger on today's examination (axial image 39 of series 11) measuring 14 x 10 mm (previously only 8 mm). No other definite new hepatic lesions are identified. No intra or extrahepatic biliary ductal dilatation. Gallbladder is unremarkable in appearance. Pancreas: No pancreatic mass. No pancreatic ductal dilatation. No  pancreatic or peripancreatic fluid or inflammatory changes. Spleen:  Unremarkable. Adrenals/Urinary Tract: Bilateral kidneys and bilateral adrenal glands are normal in appearance. No hydroureteronephrosis in the visualized portions of the abdomen. Stomach/Bowel: Visualized portions are unremarkable. Vascular/Lymphatic: No aneurysm identified in the visualized abdominal vasculature. Portal vein is patent and measures 10 mm in diameter in the porta hepatis. Hepatic veins are patent. No lymphadenopathy identified in the abdomen. Other: No significant volume of ascites noted in the visualized peritoneal cavity. Musculoskeletal: No aggressive appearing osseous lesions are noted in the visualized portions of the skeleton. IMPRESSION: 1. There continues to be increasing nodular arterial phase hyperenhancement adjacent to the ablated lesion in segment 4A, most  compatible with locally recurrent disease. Importantly, 2 other hypervascular lesions in the central aspect of segment 4A and in segment 6 of the liver appear increasingly conspicuous compared to the prior study, as detailed above, concerning for multifocal disease. 2. Cirrhosis with developing hepatic fibrosis. These results were called by telephone at the time of interpretation on 06/28/2017 at 11:39 am to Dr. Pascal Lux, who verbally acknowledged these results. Electronically Signed   By: Vinnie Langton M.D.   On: 06/28/2017 11:39   Ir Radiologist Eval & Mgmt  Result Date: 06/28/2017 Please refer to notes tab for details about interventional procedure. (Op Note)   Labs:  CBC: Recent Labs    10/04/16 1049  WBC 4.5  HGB 12.2*  HCT 35.2*  PLT 74*    COAGS: Recent Labs    10/04/16 1049  INR 1.1    BMP: Recent Labs    07/14/16 0944 10/04/16 1049 12/16/16 1015  NA 139 138 136  K 5.3 4.4 3.9  CL 103 103 99  CO2 30 30 29   GLUCOSE 150* 98 77  BUN 10 10 11   CALCIUM 9.1 9.6 9.0  CREATININE 0.93 0.93 1.12  GFRNONAA 80  --   --   GFRAA >89  --   --     LIVER FUNCTION TESTS: Recent Labs    07/14/16 0944 10/04/16 1049 12/16/16 1015  BILITOT 1.1 1.2 1.3*  AST 60* 42* 35  ALT 39 27 22  ALKPHOS 126* 114 99  PROT 6.7 6.5 6.4  ALBUMIN 4.2 4.3 4.2    TUMOR MARKERS: Recent Labs    10/04/16 1049 12/16/16 1015  AFPTM 12.4* 17.0*   AFP - 6/25 - 62.6 AFP - 9/13 - 34.4 AFP - 12/4 - 69.2   Assessment and Plan:  Mike Ferguson is a 76 y.o. male with past medical history significant for hypertension, esophageal varices and alcoholic cirrhosis (patient has not consumed alcohol since 2011) who returns today for consultation following microwave ablation of worrisome hepatic lesion performed 06/25/2016. Subsequent surveillance imaging demonstrated findings worrisome for residual disease at the ablation site as well as 2 indeterminate enhancing foci and as such, the decision was  made to proceed with conservative management and observation.   Unfortunately, the MRI performed earlier today demonstrates findings worrisome for progression of suspected residual disease about the cryoablation site as well as new potential areas of disease about the central aspect of the right lobe of the liver.   Selected images from abdominal MRI performed earlier today as well as 04/05/2017 were reviewed in detail with the patient and the patient's wife.  Dominant area of nodular enhancement about the posterior superior aspect of the ablation site now measures approximately 1.9 x 1.1 cm, previously, 1.5 x 0.6 cm, while additional nodular enhancing component about the posterior inferior aspect  of the ablation site now measures a possible 1.4 x 1.3 cm, previously, 1.1 x 0.7 cm.   Ill-defined area of nodular enhancement within the caudal aspect the right lobe the liver now measures approximately 1.4 x 1.0 cm, previously, approximately 1.1 x 0.8 cm, while ill-defined area of nodular enhancement within the caudate lobe now measures approximately 1.0 cm, previously, 0.6 cm.  These findings are associated with a recurrent elevation in the patient's AFP level (62.6 on 6/25, down to 34.4 on 9/13 and back up to 69.2 on 12/4).    Constellation of findings most worrisome for development of multifocal HCC.  Prolonged conversations were held with the patient and the patient's wife regarding potential treatment strategies:  I explained that as the additional lesions within the liver are worrisome for additional foci of [place] and as the ill-defined lesion within the central aspect of the caudate lobe is not amenable to percutaneous ablation (given its ill-defined borders and location within the central aspect of the liver and associated risk of biliary injury), microwave ablation is no longer a viable treatment option.   As such potential treatment options were discussed including:  #1 - proceeding with  transcatheter bland embolization of suspected residual disease about the ablation site as well as attempted subselective embolization of the 2 indeterminate though enlarging worrisome lesions within the right lobe the liver and caudate. I explained that transcatheter treatment options are not performed with curative intent however performed to stabilize/improve disease for several months.  #2 - Continued conservative management with follow-up MRI and AFP level in 3 months.  Following this prolonged and detailed conversation, the patient and the patient's wife wish to proceed with bland embolization of the ill-defined hepatic lesions.  As such, I will obtain a CTA of the abdomen and pelvis for procedural planning purposes.  Pending insurance approval, the bland embolization will ultimately performed at [facility]. The patient wishes for the procedure to be scheduled after the holiday season. The procedure will entail an overnight admission for continued observation and PCA usage.  Patient was encouraged to call the interventional radiology clinic with any interval questions or concerns.  A copy of this report was sent to the requesting provider on this date.  Electronically Signed: Sandi Mariscal 06/28/2017, 2:27 PM   I spent a total of 25 Minutes in face to face in clinical consultation, greater than 50% of which was counseling/coordinating care for [facility].

## 2017-07-04 ENCOUNTER — Other Ambulatory Visit (HOSPITAL_COMMUNITY): Payer: Self-pay | Admitting: Interventional Radiology

## 2017-07-04 DIAGNOSIS — C220 Liver cell carcinoma: Secondary | ICD-10-CM

## 2017-07-07 ENCOUNTER — Ambulatory Visit
Admission: RE | Admit: 2017-07-07 | Discharge: 2017-07-07 | Disposition: A | Discharge status: Home / Self Care | Payer: No Typology Code available for payment source | Source: Ambulatory Visit | Attending: Family Medicine | Admitting: Family Medicine

## 2017-07-07 DIAGNOSIS — C220 Liver cell carcinoma: Secondary | ICD-10-CM

## 2017-07-07 MED ORDER — IOPAMIDOL (ISOVUE-370) INJECTION 76%
75.0000 mL | Freq: Once | INTRAVENOUS | Status: AC | PRN
  Administered 2017-07-07: 75 mL via INTRAVENOUS

## 2017-07-22 ENCOUNTER — Other Ambulatory Visit: Payer: Self-pay | Admitting: Radiology

## 2017-07-25 ENCOUNTER — Observation Stay (HOSPITAL_COMMUNITY)
Admission: AD | Admit: 2017-07-25 | Discharge: 2017-07-26 | Disposition: A | Discharge status: Home / Self Care | Payer: Non-veteran care | Source: Ambulatory Visit | Attending: Interventional Radiology | Admitting: Interventional Radiology

## 2017-07-25 ENCOUNTER — Ambulatory Visit (HOSPITAL_COMMUNITY)
Admission: RE | Admit: 2017-07-25 | Discharge: 2017-07-25 | Disposition: A | Discharge status: Home / Self Care | Payer: Non-veteran care | Source: Ambulatory Visit | Attending: Interventional Radiology | Admitting: Interventional Radiology

## 2017-07-25 ENCOUNTER — Encounter (HOSPITAL_COMMUNITY): Payer: Self-pay

## 2017-07-25 DIAGNOSIS — I10 Essential (primary) hypertension: Secondary | ICD-10-CM | POA: Insufficient documentation

## 2017-07-25 DIAGNOSIS — Z79899 Other long term (current) drug therapy: Secondary | ICD-10-CM | POA: Diagnosis not present

## 2017-07-25 DIAGNOSIS — C220 Liver cell carcinoma: Secondary | ICD-10-CM | POA: Diagnosis not present

## 2017-07-25 DIAGNOSIS — K7030 Alcoholic cirrhosis of liver without ascites: Secondary | ICD-10-CM | POA: Diagnosis not present

## 2017-07-25 HISTORY — PX: IR ANGIOGRAM VISCERAL SELECTIVE: IMG657

## 2017-07-25 HISTORY — PX: IR US GUIDE VASC ACCESS RIGHT: IMG2390

## 2017-07-25 HISTORY — PX: IR ANGIOGRAM SELECTIVE EACH ADDITIONAL VESSEL: IMG667

## 2017-07-25 HISTORY — PX: IR EMBO TUMOR ORGAN ISCHEMIA INFARCT INC GUIDE ROADMAPPING: IMG5449

## 2017-07-25 LAB — COMPREHENSIVE METABOLIC PANEL
ALBUMIN: 4.6 g/dL (ref 3.5–5.0)
ALT: 34 U/L (ref 17–63)
ANION GAP: 9 (ref 5–15)
AST: 50 U/L — ABNORMAL HIGH (ref 15–41)
Alkaline Phosphatase: 122 U/L (ref 38–126)
BUN: 12 mg/dL (ref 6–20)
CO2: 27 mmol/L (ref 22–32)
Calcium: 9.5 mg/dL (ref 8.9–10.3)
Chloride: 103 mmol/L (ref 101–111)
Creatinine, Ser: 1.08 mg/dL (ref 0.61–1.24)
GFR calc Af Amer: >60 mL/min (ref >60)
GFR calc non Af Amer: >60 mL/min (ref >60)
GLUCOSE: 83 mg/dL (ref 65–99)
POTASSIUM: 4.1 mmol/L (ref 3.5–5.1)
SODIUM: 139 mmol/L (ref 135–145)
Total Bilirubin: 1.5 mg/dL — ABNORMAL HIGH (ref 0.3–1.2)
Total Protein: 7.6 g/dL (ref 6.5–8.1)

## 2017-07-25 LAB — CBC WITH DIFFERENTIAL/PLATELET
BASOS ABS: 0 10*3/uL (ref 0.0–0.1)
BASOS PCT: 1 %
Eosinophils Absolute: 0.5 10*3/uL (ref 0.0–0.7)
Eosinophils Relative: 8 %
HCT: 37.8 % — ABNORMAL LOW (ref 39.0–52.0)
HEMOGLOBIN: 13.4 g/dL (ref 13.0–17.0)
Lymphocytes Relative: 31 %
Lymphs Abs: 2 10*3/uL (ref 0.7–4.0)
MCH: 33 pg (ref 26.0–34.0)
MCHC: 35.4 g/dL (ref 30.0–36.0)
MCV: 93.1 fL (ref 78.0–100.0)
MONO ABS: 1.9 10*3/uL — AB (ref 0.1–1.0)
MONOS PCT: 29 %
NEUTROS ABS: 2 10*3/uL (ref 1.7–7.7)
NEUTROS PCT: 31 %
Platelets: 78 10*3/uL — ABNORMAL LOW (ref 150–400)
RBC: 4.06 MIL/uL — ABNORMAL LOW (ref 4.22–5.81)
RDW: 13.8 % (ref 11.5–15.5)
WBC: 6.4 10*3/uL (ref 4.0–10.5)

## 2017-07-25 LAB — PROTIME-INR
INR: 1.14
Prothrombin Time: 14.6 seconds (ref 11.4–15.2)

## 2017-07-25 MED ORDER — IOPAMIDOL (ISOVUE-300) INJECTION 61%
50.0000 mL | Freq: Once | INTRAVENOUS | Status: AC | PRN
  Administered 2017-07-25: 7 mL via INTRA_ARTERIAL

## 2017-07-25 MED ORDER — DIPHENHYDRAMINE HCL 50 MG/ML IJ SOLN: 12.5000 mg | Freq: Four times a day (QID) | INTRAMUSCULAR | Status: DC | PRN

## 2017-07-25 MED ORDER — MIDAZOLAM HCL 2 MG/2ML IJ SOLN
INTRAMUSCULAR | Status: AC
  Filled 2017-07-25: qty 6

## 2017-07-25 MED ORDER — SODIUM CHLORIDE 0.9% FLUSH: 3.0000 mL | INTRAVENOUS | Status: DC | PRN

## 2017-07-25 MED ORDER — IOPAMIDOL (ISOVUE-300) INJECTION 61%
50.0000 mL | Freq: Once | INTRAVENOUS | Status: AC | PRN
  Administered 2017-07-25: 34 mL via INTRA_ARTERIAL

## 2017-07-25 MED ORDER — VITAMIN B-12 100 MCG PO TABS
100.0000 ug | ORAL_TABLET | Freq: Every day | ORAL | Status: DC
  Administered 2017-07-25 – 2017-07-26 (×2): 100 ug via ORAL
  Filled 2017-07-25 (×2): qty 1

## 2017-07-25 MED ORDER — IOPAMIDOL (ISOVUE-300) INJECTION 61%
100.0000 mL | Freq: Once | INTRAVENOUS | Status: AC | PRN
  Administered 2017-07-25: 70 mL via INTRA_ARTERIAL

## 2017-07-25 MED ORDER — ONDANSETRON HCL 4 MG/2ML IJ SOLN: 4.0000 mg | Freq: Four times a day (QID) | INTRAMUSCULAR | Status: DC | PRN

## 2017-07-25 MED ORDER — SODIUM CHLORIDE 0.9 % IV SOLN: 250.0000 mL | INTRAVENOUS | Status: DC | PRN

## 2017-07-25 MED ORDER — RIFAXIMIN 550 MG PO TABS
550.0000 mg | ORAL_TABLET | Freq: Two times a day (BID) | ORAL | Status: DC
  Administered 2017-07-25 – 2017-07-26 (×2): 550 mg via ORAL
  Filled 2017-07-25 (×2): qty 1

## 2017-07-25 MED ORDER — DIPHENHYDRAMINE HCL 12.5 MG/5ML PO ELIX: 12.5000 mg | ORAL_SOLUTION | Freq: Four times a day (QID) | ORAL | Status: DC | PRN

## 2017-07-25 MED ORDER — FENTANYL CITRATE (PF) 100 MCG/2ML IJ SOLN
INTRAMUSCULAR | Status: AC
  Filled 2017-07-25: qty 4

## 2017-07-25 MED ORDER — DOCUSATE SODIUM 100 MG PO CAPS
100.0000 mg | ORAL_CAPSULE | Freq: Two times a day (BID) | ORAL | Status: DC
  Administered 2017-07-25 – 2017-07-26 (×2): 100 mg via ORAL
  Filled 2017-07-25 (×2): qty 1

## 2017-07-25 MED ORDER — NALOXONE HCL 0.4 MG/ML IJ SOLN: 0.4000 mg | INTRAMUSCULAR | Status: DC | PRN

## 2017-07-25 MED ORDER — MIDAZOLAM HCL 2 MG/2ML IJ SOLN
INTRAMUSCULAR | Status: AC | PRN
  Administered 2017-07-25 (×2): 1 mg via INTRAVENOUS

## 2017-07-25 MED ORDER — LIDOCAINE-EPINEPHRINE (PF) 1 %-1:200000 IJ SOLN
INTRAMUSCULAR | Status: AC | PRN
  Administered 2017-07-25: 20 mL

## 2017-07-25 MED ORDER — MIRTAZAPINE 15 MG PO TABS
15.0000 mg | ORAL_TABLET | Freq: Every day | ORAL | Status: DC
  Administered 2017-07-25: 15 mg via ORAL
  Filled 2017-07-25: qty 1

## 2017-07-25 MED ORDER — PIPERACILLIN-TAZOBACTAM 3.375 G IVPB
INTRAVENOUS | Status: AC
  Administered 2017-07-25: 3.375 g via INTRAVENOUS
  Filled 2017-07-25: qty 50

## 2017-07-25 MED ORDER — HYDROMORPHONE 1 MG/ML IV SOLN
INTRAVENOUS | Status: DC
  Administered 2017-07-25: 25 mg via INTRAVENOUS
  Administered 2017-07-25: 0.6 mg via INTRAVENOUS
  Administered 2017-07-25: 0.5 mg via INTRAVENOUS
  Administered 2017-07-26: 0.3 mg via INTRAVENOUS
  Administered 2017-07-26: 0 mg via INTRAVENOUS
  Filled 2017-07-25: qty 25

## 2017-07-25 MED ORDER — TAMSULOSIN HCL 0.4 MG PO CAPS
0.4000 mg | ORAL_CAPSULE | Freq: Every day | ORAL | Status: DC
  Administered 2017-07-25 – 2017-07-26 (×2): 0.4 mg via ORAL
  Filled 2017-07-25 (×2): qty 1

## 2017-07-25 MED ORDER — VITAMIN C 500 MG PO TABS
500.0000 mg | ORAL_TABLET | Freq: Every day | ORAL | Status: DC
  Administered 2017-07-25 – 2017-07-26 (×2): 500 mg via ORAL
  Filled 2017-07-25 (×2): qty 1

## 2017-07-25 MED ORDER — SODIUM CHLORIDE 0.9 % IV SOLN
INTRAVENOUS | Status: DC
  Administered 2017-07-25: 12:00:00 via INTRAVENOUS

## 2017-07-25 MED ORDER — FENTANYL CITRATE (PF) 100 MCG/2ML IJ SOLN
INTRAMUSCULAR | Status: AC | PRN
  Administered 2017-07-25 (×2): 50 ug via INTRAVENOUS

## 2017-07-25 MED ORDER — IOPAMIDOL (ISOVUE-300) INJECTION 61%
100.0000 mL | Freq: Once | INTRAVENOUS | Status: AC | PRN
  Administered 2017-07-25: 50 mL via INTRA_ARTERIAL

## 2017-07-25 MED ORDER — PROPRANOLOL HCL 20 MG PO TABS
20.0000 mg | ORAL_TABLET | Freq: Two times a day (BID) | ORAL | Status: DC
  Administered 2017-07-25 – 2017-07-26 (×2): 20 mg via ORAL
  Filled 2017-07-25 (×2): qty 1

## 2017-07-25 MED ORDER — IOPAMIDOL (ISOVUE-300) INJECTION 61%
INTRAVENOUS | Status: AC
  Administered 2017-07-25: 70 mL via INTRA_ARTERIAL
  Filled 2017-07-25: qty 50

## 2017-07-25 MED ORDER — PIPERACILLIN-TAZOBACTAM 3.375 G IVPB
3.3750 g | Freq: Once | INTRAVENOUS | Status: AC
  Administered 2017-07-25: 3.375 g via INTRAVENOUS

## 2017-07-25 MED ORDER — SODIUM CHLORIDE 0.9% FLUSH: 9.0000 mL | INTRAVENOUS | Status: DC | PRN

## 2017-07-25 MED ORDER — LIDOCAINE-EPINEPHRINE (PF) 2 %-1:200000 IJ SOLN
INTRAMUSCULAR | Status: AC
  Filled 2017-07-25: qty 20

## 2017-07-25 MED ORDER — SODIUM CHLORIDE 0.9% FLUSH: 3.0000 mL | Freq: Two times a day (BID) | INTRAVENOUS | Status: DC

## 2017-07-25 NOTE — Sedation Documentation (Signed)
2+ pedal pulses and 3+ post tibs pre and post procedure.

## 2017-07-25 NOTE — Procedures (Signed)
Pre-procedure Diagnosis: HCC Post-procedure Diagnosis: Same  Post bland embolization of and accessory right hepatic artery and the anterior division of the right hepatic artery.    Complications: Small access site hematoma, the borders of which were demarcated with a pen.  EBL: None  Keep right leg straight for 4 hrs.    SignedSandi Mariscal Pager: [phone] 07/25/2017, 3:53 PM

## 2017-07-25 NOTE — H&P (Signed)
Referring Physician(s): [name],V  Supervising Physician: Sandi Mariscal  Patient Status:  [facility] OP TBA  Chief Complaint:  Hepatocellular carcinoma  Subjective: Patient familiar to IR service from prior random liver biopsy in 2011, as well as CT-guided microwave ablation of a right hepatic lobe [place] in December 2017.  He has a history of cirrhosis, esophageal varices, portal venous hypertension, rising AFP and recent imaging revealing disease progression.  He was seen in follow-up by Dr. Pascal Lux on 06/28/17 and deemed an appropriate candidate for bland hepatic embolization to treat progressive HCC.  He presents today for the procedure.  He currently denies fever, headache, chest pain, dyspnea, cough, nausea, vomiting or abnormal bleeding.  He does have intermittent abdominal and back discomfort.  He is hard of hearing. Past Medical History:  Diagnosis Date  . Arthritis    generalized-back  . Cirrhosis ([place])   . Cirrhosis, alcoholic ([place])   . Esophageal varices ([place])   . Hearing impaired person, bilateral    hearing aids bilaterally  . Hepatocellular carcinoma ([place])   . Hypertension    Past Surgical History:  Procedure Laterality Date  . HERNIA REPAIR     RIH  . IR GENERIC HISTORICAL  06/17/2016   IR RADIOLOGIST EVAL & MGMT 06/17/2016 Sandi Mariscal, MD [facility] INTERV RAD  . IR GENERIC HISTORICAL  07/21/2016   IR RADIOLOGIST EVAL & MGMT 07/21/2016 Sandi Mariscal, MD [facility] INTERV RAD  . IR RADIOLOGIST EVAL & MGMT  10/26/2016  . IR RADIOLOGIST EVAL & MGMT  04/05/2017  . IR RADIOLOGIST EVAL & MGMT  01/11/2017  . IR RADIOLOGIST EVAL & MGMT  06/28/2017  . Left rotator cuff      Allergies: Erythromycin  Medications: Prior to Admission medications   Medication Sig Start Date End Date Taking? Authorizing Provider  cholecalciferol (VITAMIN D) 1000 units tablet Take 2,000 Units by mouth daily.   Yes [provider]  loratadine-pseudoephedrine (CLARITIN-D 24-HOUR) 10-240 MG 24 hr tablet  Take 1 tablet by mouth daily as needed for allergies.    Yes [provider]  mirtazapine (REMERON) 15 MG tablet Take 15 mg by mouth at bedtime.   Yes [provider]  propranolol (INDERAL) 20 MG tablet Take 20 mg by mouth 2 (two) times daily. 05/31/16  Yes [provider]  rifaximin (XIFAXAN) 550 MG TABS tablet Take 550 mg by mouth 2 (two) times daily.   Yes [provider]  sodium chloride (OCEAN) 0.65 % SOLN nasal spray Place 1 spray into both nostrils as needed for congestion.   Yes [provider]  traMADol (ULTRAM-ER) 100 MG 24 hr tablet Take 100 mg by mouth daily.   Yes [provider]  vitamin B-12 (CYANOCOBALAMIN) 100 MCG tablet Take 100 mcg by mouth daily.   Yes [provider]  vitamin C (ASCORBIC ACID) 500 MG tablet Take 500 mg by mouth daily.   Yes [provider]     Vital Signs: BP (!) 158/78 (BP Location: Left Arm)   Pulse (!) 51   Temp 97.9 F (36.6 C) (Oral)   Resp 18   SpO2 100%   Physical Exam awake, alert.  Chest clear to auscultation bilaterally.  Heart with bradycardic but regular rhythm.  Abdomen soft, positive bowel sounds, nontender.  No lower extremity edema.  Imaging: No results found.  Labs:  CBC: Recent Labs    10/04/16 1049  WBC 4.5  HGB 12.2*  HCT 35.2*  PLT 74*    COAGS: Recent  Labs    10/04/16 1049 07/25/17 1146  INR 1.1 1.14    BMP: Recent Labs    10/04/16 1049 12/16/16 1015 07/25/17 1146  NA 138 136 139  K 4.4 3.9 4.1  CL 103 99 103  CO2 30 29 27   GLUCOSE 98 77 83  BUN 10 11 12   CALCIUM 9.6 9.0 9.5  CREATININE 0.93 1.12 1.08  GFRNONAA  --   --  >60  GFRAA  --   --  >60    LIVER FUNCTION TESTS: Recent Labs    10/04/16 1049 12/16/16 1015 07/25/17 1146  BILITOT 1.2 1.3* 1.5*  AST 42* 35 50*  ALT 27 22 34  ALKPHOS 114 99 122  PROT 6.5 6.4 7.6  ALBUMIN 4.3 4.2 4.6    Assessment and Plan: Pt with history of cirrhosis, esophageal varices,  portal venous hypertension, multifocal HCC, rising AFP and recent imaging revealing disease progression.  He is status post microwave ablation of a right hepatic lobe [place] in December 2017.  He was seen in follow-up by Dr. Pascal Lux on 06/28/17 and deemed an appropriate candidate for bland hepatic embolization to treat progressive HCC.  He presents today for the procedure.Risks and benefits of above procedure were discussed with the patient/spouse including, but not limited to bleeding, infection, vascular injury or contrast induced renal failure.  This interventional procedure involves the use of X-rays and because of the nature of the planned procedure, it is possible that we will have prolonged use of X-ray fluoroscopy.  Potential radiation risks to you include (but are not limited to) the following: - A slightly elevated risk for cancer  several years later in life. This risk is typically less than 0.5% percent. This risk is low in comparison to the normal incidence of human cancer, which is 33% for women and 50% for men according to the [employer]. - Radiation induced injury can include skin redness, resembling a rash, tissue breakdown / ulcers and hair loss (which can be temporary or permanent).   The likelihood of either of these occurring depends on the difficulty of the procedure and whether you are sensitive to radiation due to previous procedures, disease, or genetic conditions.   IF your procedure requires a prolonged use of radiation, you will be notified and given written instructions for further action.  It is your responsibility to monitor the irradiated area for the 2 weeks following the procedure and to notify your physician if you are concerned that you have suffered a radiation induced injury.    All of the patient's questions were answered, patient is agreeable to proceed.  Consent signed and in chart.  Postprocedure he will be admitted to the hospital for overnight  observation.    Electronically Signed: D. Rowe Robert, PA-C 07/25/2017, 12:39 PM   I spent a total of 30 minutes at the the patient's bedside AND on the patient's hospital floor or unit, greater than 50% of which was counseling/coordinating care for hepatic arteriogram with bland embolization of hepatocellular carcinoma

## 2017-07-26 ENCOUNTER — Encounter (HOSPITAL_COMMUNITY): Payer: Self-pay | Admitting: Radiology

## 2017-07-26 ENCOUNTER — Other Ambulatory Visit: Payer: Self-pay | Admitting: *Deleted

## 2017-07-26 DIAGNOSIS — C220 Liver cell carcinoma: Secondary | ICD-10-CM | POA: Diagnosis not present

## 2017-07-26 DIAGNOSIS — K769 Liver disease, unspecified: Secondary | ICD-10-CM

## 2017-07-26 NOTE — Progress Notes (Signed)
Pt D/C HOME with a significant  Other. Both verbalized understanding  Of discharge instuctions

## 2017-07-26 NOTE — Discharge Instructions (Signed)
Embolization, Care After Refer to this sheet in the next few weeks. These instructions provide you with information on caring for yourself after your procedure. Your health care provider may also give you more specific instructions. Your treatment has been planned according to current medical practices, but problems sometimes occur. Call your health care provider if you have any problems or questions after your procedure. What can I expect after the procedure? After your procedure, it is typical to have the following:  Pain, swelling, or bruising at the puncture site.  Headache.  Loss of appetite, nausea, or vomiting.  Fever.  Follow these instructions at home:  Take medicine only as directed by your health care provider.  You can eat what you usually do, but be sure to include lots of fruits, vegetables, and whole grains in your diet. This helps prevent constipation.  Drink enough fluids to keep your urine clear or pale yellow.  Take two 10-minute walks each day.  Do not lift anything heavier than 10 pounds for 1 week after the procedure.  Do not drive for 24 hours after the procedure.  Do not take a bath for 5 days after the procedure. It is okay to take a shower.  You may be able to get back to all your normal activities within 1 week after your procedure. Ask your health care provider when you can return to sexual activity.  Keep all your follow-up appointments. Contact a health care provider if:  Your pain medicine is not helping.  You have a fever.  You have nausea or vomiting.  You have new bruising or swelling in your groin. Get help right away if:  You have a fever or persistent symptoms for longer than 3 days.  You have pain in your legs.  You develop swelling and discoloration in your legs.  Your legs become pale and cold or blue.  You develop shortness of breath, feel faint, or pass out.  You have chest pain or trouble breathing.  You develop a cough  or cough up blood.  You develop a rash.  You have side effects from your medicine.  You feel weak or have trouble moving your arms or legs.  You have balance problems.  You have speech or vision problems. This information is not intended to replace advice given to you by your health care provider. Make sure you discuss any questions you have with your health care provider. Document Released: 07/10/2013 Document Revised: 12/11/2015 Document Reviewed: 06/05/2013 Elsevier Interactive Patient Education  2017 [employer].

## 2017-07-26 NOTE — Progress Notes (Signed)
Pt denies pain.  Denies need for pca at this time.  pca and etc02 monitor off at this time, but pulse ox left on

## 2017-07-26 NOTE — Discharge Summary (Signed)
Patient ID: Mike Ferguson MRN: [id number] DOB/AGE: 19-May-1941 56 y.o.  Admit date: 07/25/2017 Discharge date: 07/26/2017  Supervising Physician: Sandi Mariscal  Patient Status: [facility] - In-pt  Admission Diagnoses: Hepatocellular carcinoma  Discharge Diagnoses: Hepatocellular carcinoma, status post mesenteric/visceral/hepatic arteriogram with successful particle embolization of the accessory right hepatic artery and anterior division of the right hepatic artery Active Problems:   Hepatocellular carcinoma [facility])  Past Medical History:  Diagnosis Date  . Arthritis    generalized-back  . Cirrhosis ([place])   . Cirrhosis, alcoholic ([place])   . Esophageal varices ([place])   . Hearing impaired person, bilateral    hearing aids bilaterally  . Hepatocellular carcinoma ([place])   . Hypertension    Past Surgical History:  Procedure Laterality Date  . HERNIA REPAIR     RIH  . IR ANGIOGRAM SELECTIVE EACH ADDITIONAL VESSEL  07/25/2017  . IR ANGIOGRAM SELECTIVE EACH ADDITIONAL VESSEL  07/25/2017  . IR ANGIOGRAM SELECTIVE EACH ADDITIONAL VESSEL  07/25/2017  . IR ANGIOGRAM SELECTIVE EACH ADDITIONAL VESSEL  07/25/2017  . IR ANGIOGRAM VISCERAL SELECTIVE  07/25/2017  . IR ANGIOGRAM VISCERAL SELECTIVE  07/25/2017  . IR EMBO TUMOR ORGAN ISCHEMIA INFARCT INC GUIDE ROADMAPPING  07/25/2017  . IR GENERIC HISTORICAL  06/17/2016   IR RADIOLOGIST EVAL & MGMT 06/17/2016 Sandi Mariscal, MD [facility] INTERV RAD  . IR GENERIC HISTORICAL  07/21/2016   IR RADIOLOGIST EVAL & MGMT 07/21/2016 Sandi Mariscal, MD [facility] INTERV RAD  . IR RADIOLOGIST EVAL & MGMT  10/26/2016  . IR RADIOLOGIST EVAL & MGMT  04/05/2017  . IR RADIOLOGIST EVAL & MGMT  01/11/2017  . IR RADIOLOGIST EVAL & MGMT  06/28/2017  . IR US GUIDE VASC ACCESS RIGHT  07/25/2017  . Left rotator cuff        Discharged Condition: good  Hospital Course: Mike Ferguson is a 77 year old male with history of cirrhosis, esophageal varices, portal venous hypertension, multifocal HCC, rising AFP and  recent imaging revealing disease progression.  He is status post microwave ablation of a right hepatic lobe [place] in December 2017.  He was seen in follow-up by Dr. Pascal Lux on 06/28/17 and deemed an appropriate candidate for bland hepatic embolization to treat progressive HCC.  On 07/25/17 he underwent technically successful mesenteric/visceral/hepatic arteriogram with particle embolization of the accessory right hepatic artery and anterior division of the right hepatic artery to treat multifocal HCC.  The procedure was performed with IV conscious sedation.  He tolerated the procedure well but did have a small puncture site hematoma post procedure.  Overnight the patient did well with no significant abdominal pain.  His right groin puncture site remained stable and soft with no further significant bleeding.  On the morning of discharge the patient was stable.  He was able to tolerate his diet, ambulate and void without difficulty.  Right groin was stable.  He was seen by Dr. Barbie Banner and deemed stable for discharge at this time.  He will be scheduled for follow-up with Dr. Pascal Lux in the IR clinic in 3-4 weeks with CMP.  He was encouraged to keep his follow-up appointments with his oncologist at the [facility] scheduled for later this month.  He will resume his usual home medications.  He was told to contact our service with any additional questions or concerns.      Consults: none  Significant Diagnostic Studies:  Results for orders placed or performed during the hospital encounter of 07/25/17  CBC with Differential/Platelet  Result Value Ref Range  WBC 6.4 4.0 - 10.5 K/uL   RBC 4.06 (L) 4.22 - 5.81 MIL/uL   Hemoglobin 13.4 13.0 - 17.0 g/dL   HCT 37.8 (L) 39.0 - 52.0 %   MCV 93.1 78.0 - 100.0 fL   MCH 33.0 26.0 - 34.0 pg   MCHC 35.4 30.0 - 36.0 g/dL   RDW 13.8 11.5 - 15.5 %   Platelets 78 (L) 150 - 400 K/uL   Neutrophils Relative % 31 %   Neutro Abs 2.0 1.7 - 7.7 K/uL   Lymphocytes Relative 31 %    Lymphs Abs 2.0 0.7 - 4.0 K/uL   Monocytes Relative 29 %   Monocytes Absolute 1.9 (H) 0.1 - 1.0 K/uL   Eosinophils Relative 8 %   Eosinophils Absolute 0.5 0.0 - 0.7 K/uL   Basophils Relative 1 %   Basophils Absolute 0.0 0.0 - 0.1 K/uL  Comprehensive metabolic panel  Result Value Ref Range   Sodium 139 135 - 145 mmol/L   Potassium 4.1 3.5 - 5.1 mmol/L   Chloride 103 101 - 111 mmol/L   CO2 27 22 - 32 mmol/L   Glucose, Bld 83 65 - 99 mg/dL   BUN 12 6 - 20 mg/dL   Creatinine, Ser 1.08 0.61 - 1.24 mg/dL   Calcium 9.5 8.9 - 10.3 mg/dL   Total Protein 7.6 6.5 - 8.1 g/dL   Albumin 4.6 3.5 - 5.0 g/dL   AST 50 (H) 15 - 41 U/L   ALT 34 17 - 63 U/L   Alkaline Phosphatase 122 38 - 126 U/L   Total Bilirubin 1.5 (H) 0.3 - 1.2 mg/dL   GFR calc non Af Amer >60 >60 mL/min   GFR calc Af Amer >60 >60 mL/min   Anion gap 9 5 - 15  Protime-INR  Result Value Ref Range   Prothrombin Time 14.6 11.4 - 15.2 seconds   INR 1.14      Treatments: Mesenteric/visceral/hepatic arteriogram with technically successful particle embolization of the accessory right hepatic artery and anterior division of the right hepatic artery to treat multifocal hepatocellular carcinoma 07/25/17  Discharge Exam: Blood pressure 114/65, pulse 60, temperature 97.7 F (36.5 C), temperature source Oral, resp. rate 14, SpO2 95 %. Patient awake, alert.  Chest clear to auscultation bilaterally.  Heart with regular rate and rhythm.  Abdomen soft, positive bowel sounds, nontender.  Puncture site right common femoral artery soft, clean, dry, nontender, no significant hematoma at this time.  No lower extremity edema, intact distal pulses.  Disposition: 01-Home or Self Care  Discharge Instructions    Call MD for:  difficulty breathing, headache or visual disturbances   Complete by:  As directed    Call MD for:  extreme fatigue   Complete by:  As directed    Call MD for:  hives   Complete by:  As directed    Call MD for:  persistant  dizziness or light-headedness   Complete by:  As directed    Call MD for:  persistant nausea and vomiting   Complete by:  As directed    Call MD for:  redness, tenderness, or signs of infection (pain, swelling, redness, odor or green/yellow discharge around incision site)   Complete by:  As directed    Call MD for:  severe uncontrolled pain   Complete by:  As directed    Call MD for:  temperature >100.4   Complete by:  As directed    Change dressing (specify)   Complete by:  As directed    May change bandage of her right groin puncture site and place new Band-Aid over site for the next 2-3 days.  May wash site with soap and water.   Diet - low sodium heart healthy   Complete by:  As directed    Driving Restrictions   Complete by:  As directed    No driving for 48 hours   Increase activity slowly   Complete by:  As directed    Lifting restrictions   Complete by:  As directed    No heavy lifting for the next 3-4 days   May shower / Bathe   Complete by:  As directed    May walk up steps   Complete by:  As directed      Allergies as of 07/26/2017      Reactions   Erythromycin Other (See Comments)   Abd cramps      Medication List    TAKE these medications   cholecalciferol 1000 units tablet Commonly known as:  VITAMIN D Take 2,000 Units by mouth daily.   loratadine-pseudoephedrine 10-240 MG 24 hr tablet Commonly known as:  CLARITIN-D 24-hour Take 1 tablet by mouth daily as needed for allergies.   mirtazapine 15 MG tablet Commonly known as:  REMERON Take 15 mg by mouth at bedtime.   propranolol 20 MG tablet Commonly known as:  INDERAL Take 20 mg by mouth 2 (two) times daily.   rifaximin 550 MG Tabs tablet Commonly known as:  XIFAXAN Take 550 mg by mouth 2 (two) times daily.   sodium chloride 0.65 % Soln nasal spray Commonly known as:  OCEAN Place 1 spray into both nostrils as needed for congestion.   tamsulosin 0.4 MG Caps capsule Commonly known as:   FLOMAX TAKE 1 capsule Once in the evening Orally 30 day(s)   traMADol 50 MG tablet Commonly known as:  ULTRAM Take 50 mg by mouth every 6 (six) hours as needed for moderate pain or severe pain. What changed:  Another medication with the same name was removed. Continue taking this medication, and follow the directions you see here.   vitamin B-12 100 MCG tablet Commonly known as:  CYANOCOBALAMIN Take 100 mcg by mouth daily.   vitamin C 500 MG tablet Commonly known as:  ASCORBIC ACID Take 500 mg by mouth daily.            Discharge Care Instructions  (From admission, onward)        Start     Ordered   07/26/17 0000  Change dressing (specify)    Comments:  May change bandage of her right groin puncture site and place new Band-Aid over site for the next 2-3 days.  May wash site with soap and water.   07/26/17 1051     Follow-up Information    Sandi Mariscal, MD Follow up.   Specialties:  Interventional Radiology, Radiology Why:  Radiology will call you with follow-up appointment with Dr. Pascal Lux in the IR clinic in the next 3-4 weeks.   Call [phone] or [phone] with any questions. Contact information: [address] [place] [address] [phone]            Electronically Signed: D. Rowe Robert, PA-C 07/26/2017, 10:53 AM   I have spent less than 30 minutes discharging [employer].

## 2017-07-26 NOTE — Progress Notes (Signed)
Pt's right groin site remains soft. Dressing dry and intact.  After pt was up and ambulating-noted small amt drainage on right groin dressing around the edges.  Area remains soft.

## 2017-08-18 ENCOUNTER — Ambulatory Visit
Admission: RE | Admit: 2017-08-18 | Discharge: 2017-08-18 | Disposition: A | Discharge status: Home / Self Care | Payer: Non-veteran care | Source: Ambulatory Visit | Attending: Radiology | Admitting: Radiology

## 2017-08-18 ENCOUNTER — Encounter: Payer: Self-pay | Admitting: *Deleted

## 2017-08-18 DIAGNOSIS — C220 Liver cell carcinoma: Secondary | ICD-10-CM

## 2017-08-18 DIAGNOSIS — Z9889 Other specified postprocedural states: Secondary | ICD-10-CM | POA: Diagnosis not present

## 2017-08-18 HISTORY — PX: IR RADIOLOGIST EVAL & MGMT: IMG5224

## 2017-08-18 NOTE — Progress Notes (Signed)
Patient ID: Mike Ferguson, male   DOB: 02-Aug-1940, 77 y.o.   MRN: [id number]        Chief Complaint: Patient was seen in consultation today for  Chief Complaint  Patient presents with  . Follow-up    3 wk follow u p Bland Embolization of Hepatocellular Carcinoma    Referring Physician(s): [name] (GI)  History of Present Illness: Mike Ferguson is a 77 y.o. male with past medical history significant for hypertension, esophageal varices and alcoholic cirrhosis (patient has not consumed alcohol since 2011) who returns today following bland hepatic embolization performed 07/25/2017 for progression of residual disease surrounding the prior hepatic ablation site as well as development of multifocal HCC.  The patient again accompanied by his wife though serves as a historian.  Patient continues to complain of mild intermittent right upper quadrant abdominal pain which is been present since undergoing the microwave ablation. Additionally, the patient reports mild intermittent right groin pain since undergoing the arteriogram.  Patient complains of minimal fatigue and decreased appetite though denies unintentional weight loss.  Patient is otherwise without complaint. No fever or chills. No chest pain or shortness of breath. No yellowing of the skin or eyes. No change in abdominal girth. No change in mental status.   Past Medical History:  Diagnosis Date  . Arthritis    generalized-back  . Cirrhosis ([place])   . Cirrhosis, alcoholic ([place])   . Esophageal varices ([place])   . Hearing impaired person, bilateral    hearing aids bilaterally  . Hepatocellular carcinoma ([place])   . Hypertension     Past Surgical History:  Procedure Laterality Date  . HERNIA REPAIR     RIH  . IR ANGIOGRAM SELECTIVE EACH ADDITIONAL VESSEL  07/25/2017  . IR ANGIOGRAM SELECTIVE EACH ADDITIONAL VESSEL  07/25/2017  . IR ANGIOGRAM SELECTIVE EACH ADDITIONAL VESSEL  07/25/2017  . IR ANGIOGRAM SELECTIVE EACH ADDITIONAL VESSEL   07/25/2017  . IR ANGIOGRAM VISCERAL SELECTIVE  07/25/2017  . IR ANGIOGRAM VISCERAL SELECTIVE  07/25/2017  . IR EMBO TUMOR ORGAN ISCHEMIA INFARCT INC GUIDE ROADMAPPING  07/25/2017  . IR GENERIC HISTORICAL  06/17/2016   IR RADIOLOGIST EVAL & MGMT 06/17/2016 Sandi Mariscal, MD [facility] INTERV RAD  . IR GENERIC HISTORICAL  07/21/2016   IR RADIOLOGIST EVAL & MGMT 07/21/2016 Sandi Mariscal, MD [facility] INTERV RAD  . IR RADIOLOGIST EVAL & MGMT  10/26/2016  . IR RADIOLOGIST EVAL & MGMT  04/05/2017  . IR RADIOLOGIST EVAL & MGMT  01/11/2017  . IR RADIOLOGIST EVAL & MGMT  06/28/2017  . IR US GUIDE VASC ACCESS RIGHT  07/25/2017  . Left rotator cuff      Allergies: Erythromycin  Medications: Prior to Admission medications   Medication Sig Start Date End Date Taking? Authorizing Provider  cholecalciferol (VITAMIN D) 1000 units tablet Take 2,000 Units by mouth daily.   Yes [provider]  loratadine-pseudoephedrine (CLARITIN-D 24-HOUR) 10-240 MG 24 hr tablet Take 1 tablet by mouth daily as needed for allergies.    Yes [provider]  mirtazapine (REMERON) 15 MG tablet Take 15 mg by mouth at bedtime.   Yes [provider]  propranolol (INDERAL) 20 MG tablet Take 20 mg by mouth 2 (two) times daily. 05/31/16  Yes [provider]  rifaximin (XIFAXAN) 550 MG TABS tablet Take 550 mg by mouth 2 (two) times daily.   Yes [provider]  sodium chloride (OCEAN) 0.65 % SOLN nasal spray Place 1 spray into both nostrils as  needed for congestion.   Yes [provider]  tamsulosin (FLOMAX) 0.4 MG CAPS capsule TAKE 1 capsule Once in the evening Orally 30 day(s) 05/20/17  Yes [provider]  traMADol (ULTRAM) 50 MG tablet Take 50 mg by mouth every 6 (six) hours as needed for moderate pain or severe pain.   Yes [provider]  vitamin B-12 (CYANOCOBALAMIN) 100 MCG tablet Take 100 mcg by mouth daily.   Yes [provider]  vitamin C (ASCORBIC ACID) 500 MG tablet  Take 500 mg by mouth daily.   Yes [provider]     No family history on file.  Social History   Socioeconomic History  . Marital status: Married    Spouse name: Not on file  . Number of children: Not on file  . Years of education: Not on file  . Highest education level: Not on file  Social Needs  . Financial resource strain: Not on file  . Food insecurity - worry: Not on file  . Food insecurity - inability: Not on file  . Transportation needs - medical: Not on file  . Transportation needs - non-medical: Not on file  Occupational History  . Not on file  Tobacco Use  . Smoking status: Former Smoker    Last attempt to quit: 06/24/1986    Years since quitting: 31.1  . Smokeless tobacco: Never Used  Substance and Sexual Activity  . Alcohol use: No    Comment: 2011 Quit- ETOH abuse  . Drug use: No  . Sexual activity: Not Currently  Other Topics Concern  . Not on file  Social History Narrative  . Not on file    ECOG Status: 1 - Symptomatic but completely ambulatory  Review of Systems: A 12 point ROS discussed and pertinent positives are indicated in the HPI above.  All other systems are negative.  Review of Systems  Constitutional: Positive for appetite change and fatigue. Negative for activity change.       Patient admits to fatigue and decreased appetite. No fever or chills.  Eyes: Negative.   Respiratory: Negative.   Cardiovascular: Negative.   Gastrointestinal: Positive for abdominal pain.       Patient amidst to chronic intermittent right upper quadrant abdominal pain stable since the time of prior hepatic ablation.  Skin: Negative.   Psychiatric/Behavioral: Negative.     Vital Signs: BP 140/67   Pulse (!) 51   Temp 98 F (36.7 C) (Oral)   Resp 14   Ht _0  (1.651 m)   Wt 142 lb (64.4 kg)   SpO2 100%   BMI 23.63 kg/m   Physical Exam  Constitutional: He appears well-developed and well-nourished.  HENT:  Head: Atraumatic.  Abdominal:    Minimal amount of erythema is seen at the level of the right groin however I am unable to appreciate a sizable hematoma.  Palpable right common femoral arterial pulse.  Psychiatric: He has a normal mood and affect. His behavior is normal.  Nursing note and vitals reviewed.   Imaging:  Selected images from hepatic arteriogram and bland embolization performed 07/25/2017 were reviewed in detail with the patient and the patient's wife.  Ir Angiogram Visceral Selective  Result Date: 07/25/2017 INDICATION: History of hepatocellular carcinoma. Patient presents today for bland embolization of known multifocal hepatocellular carcinoma. Please refer to formal consultation in the [facility] epic EMR dated 06/28/2017 for additional details. EXAM: 1. ULTRASOUND GUIDANCE FOR ARTERIAL ACCESS 2. SELECTIVE SUPERIOR MESENTERIC ARTERIOGRAM 3. SELECTIVE CELIAC  ARTERIOGRAM 4. SELECTIVE PROPER HEPATIC ARTERIOGRAM 5. SELECTIVE ACCESSORY RIGHT HEPATIC ARTERIOGRAM AND PERCUTANEOUS PARTICLE EMBOLIZATION 6. SELECTIVE RIGHT POSTERIOR DIVISION ARTERIOGRAM 7. SELECTIVE RIGHT ANTERIOR DIVISION ARTERIOGRAM AND PERCUTANEOUS PARTICLE EMBOLIZATION COMPARISON:  CTA of the abdomen and pelvis - 07/07/2017; abdominal MRI - 06/28/2017; 04/05/2017 MEDICATIONS: Zosyn 3.375 g IV; the antibiotic was administered within 1 hour of the procedure ANESTHESIA/SEDATION: Moderate (conscious) sedation was employed during this procedure. A total of Versed 3 mg and Fentanyl 150 mcg was administered intravenously. Moderate Sedation Time: 100 minutes. The patient's level of consciousness and vital signs were monitored continuously by radiology nursing throughout the procedure under my direct supervision. CONTRAST:  160 cc Isovue 300 FLUOROSCOPY TIME:  22 minutes 30 seconds (2348 mGy). COMPLICATIONS: SIR Level A - No therapy, no consequence. A small hematoma was noted at the right groin access site following approximately 15 minutes of manual compression. As such,  the borders of the hematoma were demarcated with a pen and an additional 10 minutes of manual compression was performed ultimately achieving hemostasis. A dressing was placed. The patient otherwise tolerated procedure well without immediate complication. PROCEDURE: Informed consent was obtained from the patient following explanation of the procedure, risks, benefits and alternatives. The patient understands, agrees and consents for the procedure. All questions were addressed. A time out was performed prior to the initiation of the procedure. Maximal barrier sterile technique utilized including caps, mask, sterile gowns, sterile gloves, large sterile drape, hand hygiene, and Betadine prep. The right femoral head was marked fluoroscopically. Under ultrasound guidance, the right common femoral artery was accessed with a micropuncture kit after the overlying soft tissues were anesthetized with 1% lidocaine. An ultrasound image was saved for documentation purposes. The micropuncture sheath was exchanged for a 5 [place] vascular sheath over a Bentson wire. A closure arteriogram was performed through the side of the sheath confirming access within the right common femoral artery. Over a Bentson wire, a Mickelson catheter was advanced to the level of the thoracic aorta where it was back bled and flushed. The catheter was then utilized to select the superior mesenteric artery. Selective superior mesenteric arteriogram was performed with delayed portal venous imaging. The Mickelson catheter was then advanced cranially to select the celiac artery and a selective celiac arteriogram was performed. With the use of an 00.14 Fathom wire, a high-flow Renegade microcatheter was advanced into the proper hepatic artery and a selective proper hepatic arteriogram was performed. The microcatheter was utilized to select the accessory right hepatic artery. Contrast injection confirmed appropriate positioning and the vessel was embolized with  100-300 micron Embospheres. The microcatheter was retracted into the origin of the vessel and post embolization angiogram was performed. Next, a selective right hepatic arteriogram was performed. The microcatheter was utilized select both the anterior and posterior divisions of the right hepatic arteries and selective arteriograms were performed at both of these locations. Ultimately, it was determined that there was arterial supply to the residual tumor about the ablation site was supplied via the anterior division of the right hepatic artery (as was confirmed on preceding abdominal CTA). As such, the microcatheter was advanced into the anterior division of the right hepatic artery at the level of the vessel's bifurcation. Contrast injection confirmed appropriate positioning and the vessel was embolized to near stasis with 300 of 500 micron Embospheres. The microcatheter was retracted into the right hepatic artery and post embolization arteriogram was performed. Images were reviewed and the procedure was terminated. All wires, catheters and sheaths were removed from  the patient. Hemostasis was achieved at the right groin access site with manual compression. A small hematoma was noted at the right groin access site following approximately 15 minutes of manual compression. As such, the borders of the hematoma were demarcated with a pen and an additional 10 minutes of manual compression was performed ultimately achieving hemostasis. A dressing was placed. The patient otherwise tolerated procedure well without immediate complication. FINDINGS: Selective superior mesenteric arteriogram demonstrates conventional branching pattern without accessory or replaced hepatic arterial supply. Delayed portal venous imaging demonstrates patency of the main, right and left portal veins. Celiac arteriogram demonstrates an accessory right hepatic artery supplying the medial aspect the right lobe of the liver as was demonstrated on  preceding abdominal CTA. Delayed portal venous imaging demonstrates patency of the main, right and left portal veins. Selective accessory right hepatic arteriogram demonstrates arterial supply to the majority of both the residual enhancing tumor about the ablation site as well as an additional ill-defined lesion within the dome of the caudate lobe (seen on image 39, series 40 of preceding abdominal CTA). This vessel was subsequently embolized to stasis with 100-300 micron Embospheres. Dedicated arteriograms of both the anterior and posterior division of the right hepatic artery were performed in various obliquities, ultimately demonstrating ill-defined arterial supply to the residual tumor about the ablation site supplied by the anterior division of the right hepatic artery as was confirmed on preceding CTA. As such, the anterior division of the right hepatic artery was successfully embolized with 300 - 500 micron Embospheres to near vessel stasis. It is felt that an additional small lesion within the posterior inferior aspect of the right lobe of the liver is likely supplied via the posterior division of the right hepatic artery, however dedicated embolization was not performed dislocation given patient's mildly elevated bilirubin level (preprocedural bilirubin was 1.5). IMPRESSION: 1. Technically successful particle embolization of the accessory right hepatic artery 2. Technically successful particle embolization of the anterior division of the right hepatic artery. PLAN: - The patient will be admitted overnight for continued observation PCA usage. - Patient will follow-up in the interventional radiology clinic in 3-4 weeks with postprocedural CMP level. - Initial imaging will be performed in approximately 3 months (mid March). Patient was encouraged to keep his follow-up appointments with his oncologist, located at the [facility], scheduled for later this month. Electronically Signed   By: Sandi Mariscal M.D.    On: 07/25/2017 16:40   Ir Angiogram Visceral Selective  Result Date: 07/25/2017 INDICATION: History of hepatocellular carcinoma. Patient presents today for bland embolization of known multifocal hepatocellular carcinoma. Please refer to formal consultation in the [facility] epic EMR dated 06/28/2017 for additional details. EXAM: 1. ULTRASOUND GUIDANCE FOR ARTERIAL ACCESS 2. SELECTIVE SUPERIOR MESENTERIC ARTERIOGRAM 3. SELECTIVE CELIAC ARTERIOGRAM 4. SELECTIVE PROPER HEPATIC ARTERIOGRAM 5. SELECTIVE ACCESSORY RIGHT HEPATIC ARTERIOGRAM AND PERCUTANEOUS PARTICLE EMBOLIZATION 6. SELECTIVE RIGHT POSTERIOR DIVISION ARTERIOGRAM 7. SELECTIVE RIGHT ANTERIOR DIVISION ARTERIOGRAM AND PERCUTANEOUS PARTICLE EMBOLIZATION COMPARISON:  CTA of the abdomen and pelvis - 07/07/2017; abdominal MRI - 06/28/2017; 04/05/2017 MEDICATIONS: Zosyn 3.375 g IV; the antibiotic was administered within 1 hour of the procedure ANESTHESIA/SEDATION: Moderate (conscious) sedation was employed during this procedure. A total of Versed 3 mg and Fentanyl 150 mcg was administered intravenously. Moderate Sedation Time: 100 minutes. The patient's level of consciousness and vital signs were monitored continuously by radiology nursing throughout the procedure under my direct supervision. CONTRAST:  160 cc Isovue 300 FLUOROSCOPY TIME:  22 minutes 30 seconds (2348 mGy).  COMPLICATIONS: SIR Level A - No therapy, no consequence. A small hematoma was noted at the right groin access site following approximately 15 minutes of manual compression. As such, the borders of the hematoma were demarcated with a pen and an additional 10 minutes of manual compression was performed ultimately achieving hemostasis. A dressing was placed. The patient otherwise tolerated procedure well without immediate complication. PROCEDURE: Informed consent was obtained from the patient following explanation of the procedure, risks, benefits and alternatives. The patient understands, agrees and  consents for the procedure. All questions were addressed. A time out was performed prior to the initiation of the procedure. Maximal barrier sterile technique utilized including caps, mask, sterile gowns, sterile gloves, large sterile drape, hand hygiene, and Betadine prep. The right femoral head was marked fluoroscopically. Under ultrasound guidance, the right common femoral artery was accessed with a micropuncture kit after the overlying soft tissues were anesthetized with 1% lidocaine. An ultrasound image was saved for documentation purposes. The micropuncture sheath was exchanged for a 5 [place] vascular sheath over a Bentson wire. A closure arteriogram was performed through the side of the sheath confirming access within the right common femoral artery. Over a Bentson wire, a Mickelson catheter was advanced to the level of the thoracic aorta where it was back bled and flushed. The catheter was then utilized to select the superior mesenteric artery. Selective superior mesenteric arteriogram was performed with delayed portal venous imaging. The Mickelson catheter was then advanced cranially to select the celiac artery and a selective celiac arteriogram was performed. With the use of an 00.14 Fathom wire, a high-flow Renegade microcatheter was advanced into the proper hepatic artery and a selective proper hepatic arteriogram was performed. The microcatheter was utilized to select the accessory right hepatic artery. Contrast injection confirmed appropriate positioning and the vessel was embolized with 100-300 micron Embospheres. The microcatheter was retracted into the origin of the vessel and post embolization angiogram was performed. Next, a selective right hepatic arteriogram was performed. The microcatheter was utilized select both the anterior and posterior divisions of the right hepatic arteries and selective arteriograms were performed at both of these locations. Ultimately, it was determined that there was  arterial supply to the residual tumor about the ablation site was supplied via the anterior division of the right hepatic artery (as was confirmed on preceding abdominal CTA). As such, the microcatheter was advanced into the anterior division of the right hepatic artery at the level of the vessel's bifurcation. Contrast injection confirmed appropriate positioning and the vessel was embolized to near stasis with 300 of 500 micron Embospheres. The microcatheter was retracted into the right hepatic artery and post embolization arteriogram was performed. Images were reviewed and the procedure was terminated. All wires, catheters and sheaths were removed from the patient. Hemostasis was achieved at the right groin access site with manual compression. A small hematoma was noted at the right groin access site following approximately 15 minutes of manual compression. As such, the borders of the hematoma were demarcated with a pen and an additional 10 minutes of manual compression was performed ultimately achieving hemostasis. A dressing was placed. The patient otherwise tolerated procedure well without immediate complication. FINDINGS: Selective superior mesenteric arteriogram demonstrates conventional branching pattern without accessory or replaced hepatic arterial supply. Delayed portal venous imaging demonstrates patency of the main, right and left portal veins. Celiac arteriogram demonstrates an accessory right hepatic artery supplying the medial aspect the right lobe of the liver as was demonstrated on preceding abdominal CTA.  Delayed portal venous imaging demonstrates patency of the main, right and left portal veins. Selective accessory right hepatic arteriogram demonstrates arterial supply to the majority of both the residual enhancing tumor about the ablation site as well as an additional ill-defined lesion within the dome of the caudate lobe (seen on image 39, series 40 of preceding abdominal CTA). This vessel was  subsequently embolized to stasis with 100-300 micron Embospheres. Dedicated arteriograms of both the anterior and posterior division of the right hepatic artery were performed in various obliquities, ultimately demonstrating ill-defined arterial supply to the residual tumor about the ablation site supplied by the anterior division of the right hepatic artery as was confirmed on preceding CTA. As such, the anterior division of the right hepatic artery was successfully embolized with 300 - 500 micron Embospheres to near vessel stasis. It is felt that an additional small lesion within the posterior inferior aspect of the right lobe of the liver is likely supplied via the posterior division of the right hepatic artery, however dedicated embolization was not performed dislocation given patient's mildly elevated bilirubin level (preprocedural bilirubin was 1.5). IMPRESSION: 1. Technically successful particle embolization of the accessory right hepatic artery 2. Technically successful particle embolization of the anterior division of the right hepatic artery. PLAN: - The patient will be admitted overnight for continued observation PCA usage. - Patient will follow-up in the interventional radiology clinic in 3-4 weeks with postprocedural CMP level. - Initial imaging will be performed in approximately 3 months (mid March). Patient was encouraged to keep his follow-up appointments with his oncologist, located at the [facility], scheduled for later this month. Electronically Signed   By: Sandi Mariscal M.D.   On: 07/25/2017 16:40   Ir Angiogram Selective Each Additional Vessel  Result Date: 07/25/2017 INDICATION: History of hepatocellular carcinoma. Patient presents today for bland embolization of known multifocal hepatocellular carcinoma. Please refer to formal consultation in the [facility] epic EMR dated 06/28/2017 for additional details. EXAM: 1. ULTRASOUND GUIDANCE FOR ARTERIAL ACCESS 2. SELECTIVE SUPERIOR MESENTERIC  ARTERIOGRAM 3. SELECTIVE CELIAC ARTERIOGRAM 4. SELECTIVE PROPER HEPATIC ARTERIOGRAM 5. SELECTIVE ACCESSORY RIGHT HEPATIC ARTERIOGRAM AND PERCUTANEOUS PARTICLE EMBOLIZATION 6. SELECTIVE RIGHT POSTERIOR DIVISION ARTERIOGRAM 7. SELECTIVE RIGHT ANTERIOR DIVISION ARTERIOGRAM AND PERCUTANEOUS PARTICLE EMBOLIZATION COMPARISON:  CTA of the abdomen and pelvis - 07/07/2017; abdominal MRI - 06/28/2017; 04/05/2017 MEDICATIONS: Zosyn 3.375 g IV; the antibiotic was administered within 1 hour of the procedure ANESTHESIA/SEDATION: Moderate (conscious) sedation was employed during this procedure. A total of Versed 3 mg and Fentanyl 150 mcg was administered intravenously. Moderate Sedation Time: 100 minutes. The patient's level of consciousness and vital signs were monitored continuously by radiology nursing throughout the procedure under my direct supervision. CONTRAST:  160 cc Isovue 300 FLUOROSCOPY TIME:  22 minutes 30 seconds (2348 mGy). COMPLICATIONS: SIR Level A - No therapy, no consequence. A small hematoma was noted at the right groin access site following approximately 15 minutes of manual compression. As such, the borders of the hematoma were demarcated with a pen and an additional 10 minutes of manual compression was performed ultimately achieving hemostasis. A dressing was placed. The patient otherwise tolerated procedure well without immediate complication. PROCEDURE: Informed consent was obtained from the patient following explanation of the procedure, risks, benefits and alternatives. The patient understands, agrees and consents for the procedure. All questions were addressed. A time out was performed prior to the initiation of the procedure. Maximal barrier sterile technique utilized including caps, mask, sterile gowns, sterile gloves, large sterile drape, hand  hygiene, and Betadine prep. The right femoral head was marked fluoroscopically. Under ultrasound guidance, the right common femoral artery was accessed with a  micropuncture kit after the overlying soft tissues were anesthetized with 1% lidocaine. An ultrasound image was saved for documentation purposes. The micropuncture sheath was exchanged for a 5 [place] vascular sheath over a Bentson wire. A closure arteriogram was performed through the side of the sheath confirming access within the right common femoral artery. Over a Bentson wire, a Mickelson catheter was advanced to the level of the thoracic aorta where it was back bled and flushed. The catheter was then utilized to select the superior mesenteric artery. Selective superior mesenteric arteriogram was performed with delayed portal venous imaging. The Mickelson catheter was then advanced cranially to select the celiac artery and a selective celiac arteriogram was performed. With the use of an 00.14 Fathom wire, a high-flow Renegade microcatheter was advanced into the proper hepatic artery and a selective proper hepatic arteriogram was performed. The microcatheter was utilized to select the accessory right hepatic artery. Contrast injection confirmed appropriate positioning and the vessel was embolized with 100-300 micron Embospheres. The microcatheter was retracted into the origin of the vessel and post embolization angiogram was performed. Next, a selective right hepatic arteriogram was performed. The microcatheter was utilized select both the anterior and posterior divisions of the right hepatic arteries and selective arteriograms were performed at both of these locations. Ultimately, it was determined that there was arterial supply to the residual tumor about the ablation site was supplied via the anterior division of the right hepatic artery (as was confirmed on preceding abdominal CTA). As such, the microcatheter was advanced into the anterior division of the right hepatic artery at the level of the vessel's bifurcation. Contrast injection confirmed appropriate positioning and the vessel was embolized to near  stasis with 300 of 500 micron Embospheres. The microcatheter was retracted into the right hepatic artery and post embolization arteriogram was performed. Images were reviewed and the procedure was terminated. All wires, catheters and sheaths were removed from the patient. Hemostasis was achieved at the right groin access site with manual compression. A small hematoma was noted at the right groin access site following approximately 15 minutes of manual compression. As such, the borders of the hematoma were demarcated with a pen and an additional 10 minutes of manual compression was performed ultimately achieving hemostasis. A dressing was placed. The patient otherwise tolerated procedure well without immediate complication. FINDINGS: Selective superior mesenteric arteriogram demonstrates conventional branching pattern without accessory or replaced hepatic arterial supply. Delayed portal venous imaging demonstrates patency of the main, right and left portal veins. Celiac arteriogram demonstrates an accessory right hepatic artery supplying the medial aspect the right lobe of the liver as was demonstrated on preceding abdominal CTA. Delayed portal venous imaging demonstrates patency of the main, right and left portal veins. Selective accessory right hepatic arteriogram demonstrates arterial supply to the majority of both the residual enhancing tumor about the ablation site as well as an additional ill-defined lesion within the dome of the caudate lobe (seen on image 39, series 40 of preceding abdominal CTA). This vessel was subsequently embolized to stasis with 100-300 micron Embospheres. Dedicated arteriograms of both the anterior and posterior division of the right hepatic artery were performed in various obliquities, ultimately demonstrating ill-defined arterial supply to the residual tumor about the ablation site supplied by the anterior division of the right hepatic artery as was confirmed on preceding CTA. As  such, the anterior  division of the right hepatic artery was successfully embolized with 300 - 500 micron Embospheres to near vessel stasis. It is felt that an additional small lesion within the posterior inferior aspect of the right lobe of the liver is likely supplied via the posterior division of the right hepatic artery, however dedicated embolization was not performed dislocation given patient's mildly elevated bilirubin level (preprocedural bilirubin was 1.5). IMPRESSION: 1. Technically successful particle embolization of the accessory right hepatic artery 2. Technically successful particle embolization of the anterior division of the right hepatic artery. PLAN: - The patient will be admitted overnight for continued observation PCA usage. - Patient will follow-up in the interventional radiology clinic in 3-4 weeks with postprocedural CMP level. - Initial imaging will be performed in approximately 3 months (mid March). Patient was encouraged to keep his follow-up appointments with his oncologist, located at the [facility], scheduled for later this month. Electronically Signed   By: Sandi Mariscal M.D.   On: 07/25/2017 16:40   Ir Angiogram Selective Each Additional Vessel  Result Date: 07/25/2017 INDICATION: History of hepatocellular carcinoma. Patient presents today for bland embolization of known multifocal hepatocellular carcinoma. Please refer to formal consultation in the [facility] epic EMR dated 06/28/2017 for additional details. EXAM: 1. ULTRASOUND GUIDANCE FOR ARTERIAL ACCESS 2. SELECTIVE SUPERIOR MESENTERIC ARTERIOGRAM 3. SELECTIVE CELIAC ARTERIOGRAM 4. SELECTIVE PROPER HEPATIC ARTERIOGRAM 5. SELECTIVE ACCESSORY RIGHT HEPATIC ARTERIOGRAM AND PERCUTANEOUS PARTICLE EMBOLIZATION 6. SELECTIVE RIGHT POSTERIOR DIVISION ARTERIOGRAM 7. SELECTIVE RIGHT ANTERIOR DIVISION ARTERIOGRAM AND PERCUTANEOUS PARTICLE EMBOLIZATION COMPARISON:  CTA of the abdomen and pelvis - 07/07/2017; abdominal MRI - 06/28/2017; 04/05/2017  MEDICATIONS: Zosyn 3.375 g IV; the antibiotic was administered within 1 hour of the procedure ANESTHESIA/SEDATION: Moderate (conscious) sedation was employed during this procedure. A total of Versed 3 mg and Fentanyl 150 mcg was administered intravenously. Moderate Sedation Time: 100 minutes. The patient's level of consciousness and vital signs were monitored continuously by radiology nursing throughout the procedure under my direct supervision. CONTRAST:  160 cc Isovue 300 FLUOROSCOPY TIME:  22 minutes 30 seconds (2348 mGy). COMPLICATIONS: SIR Level A - No therapy, no consequence. A small hematoma was noted at the right groin access site following approximately 15 minutes of manual compression. As such, the borders of the hematoma were demarcated with a pen and an additional 10 minutes of manual compression was performed ultimately achieving hemostasis. A dressing was placed. The patient otherwise tolerated procedure well without immediate complication. PROCEDURE: Informed consent was obtained from the patient following explanation of the procedure, risks, benefits and alternatives. The patient understands, agrees and consents for the procedure. All questions were addressed. A time out was performed prior to the initiation of the procedure. Maximal barrier sterile technique utilized including caps, mask, sterile gowns, sterile gloves, large sterile drape, hand hygiene, and Betadine prep. The right femoral head was marked fluoroscopically. Under ultrasound guidance, the right common femoral artery was accessed with a micropuncture kit after the overlying soft tissues were anesthetized with 1% lidocaine. An ultrasound image was saved for documentation purposes. The micropuncture sheath was exchanged for a 5 [place] vascular sheath over a Bentson wire. A closure arteriogram was performed through the side of the sheath confirming access within the right common femoral artery. Over a Bentson wire, a Mickelson catheter  was advanced to the level of the thoracic aorta where it was back bled and flushed. The catheter was then utilized to select the superior mesenteric artery. Selective superior mesenteric arteriogram was performed with delayed portal venous imaging.  The Mickelson catheter was then advanced cranially to select the celiac artery and a selective celiac arteriogram was performed. With the use of an 00.14 Fathom wire, a high-flow Renegade microcatheter was advanced into the proper hepatic artery and a selective proper hepatic arteriogram was performed. The microcatheter was utilized to select the accessory right hepatic artery. Contrast injection confirmed appropriate positioning and the vessel was embolized with 100-300 micron Embospheres. The microcatheter was retracted into the origin of the vessel and post embolization angiogram was performed. Next, a selective right hepatic arteriogram was performed. The microcatheter was utilized select both the anterior and posterior divisions of the right hepatic arteries and selective arteriograms were performed at both of these locations. Ultimately, it was determined that there was arterial supply to the residual tumor about the ablation site was supplied via the anterior division of the right hepatic artery (as was confirmed on preceding abdominal CTA). As such, the microcatheter was advanced into the anterior division of the right hepatic artery at the level of the vessel's bifurcation. Contrast injection confirmed appropriate positioning and the vessel was embolized to near stasis with 300 of 500 micron Embospheres. The microcatheter was retracted into the right hepatic artery and post embolization arteriogram was performed. Images were reviewed and the procedure was terminated. All wires, catheters and sheaths were removed from the patient. Hemostasis was achieved at the right groin access site with manual compression. A small hematoma was noted at the right groin access  site following approximately 15 minutes of manual compression. As such, the borders of the hematoma were demarcated with a pen and an additional 10 minutes of manual compression was performed ultimately achieving hemostasis. A dressing was placed. The patient otherwise tolerated procedure well without immediate complication. FINDINGS: Selective superior mesenteric arteriogram demonstrates conventional branching pattern without accessory or replaced hepatic arterial supply. Delayed portal venous imaging demonstrates patency of the main, right and left portal veins. Celiac arteriogram demonstrates an accessory right hepatic artery supplying the medial aspect the right lobe of the liver as was demonstrated on preceding abdominal CTA. Delayed portal venous imaging demonstrates patency of the main, right and left portal veins. Selective accessory right hepatic arteriogram demonstrates arterial supply to the majority of both the residual enhancing tumor about the ablation site as well as an additional ill-defined lesion within the dome of the caudate lobe (seen on image 39, series 40 of preceding abdominal CTA). This vessel was subsequently embolized to stasis with 100-300 micron Embospheres. Dedicated arteriograms of both the anterior and posterior division of the right hepatic artery were performed in various obliquities, ultimately demonstrating ill-defined arterial supply to the residual tumor about the ablation site supplied by the anterior division of the right hepatic artery as was confirmed on preceding CTA. As such, the anterior division of the right hepatic artery was successfully embolized with 300 - 500 micron Embospheres to near vessel stasis. It is felt that an additional small lesion within the posterior inferior aspect of the right lobe of the liver is likely supplied via the posterior division of the right hepatic artery, however dedicated embolization was not performed dislocation given patient's mildly  elevated bilirubin level (preprocedural bilirubin was 1.5). IMPRESSION: 1. Technically successful particle embolization of the accessory right hepatic artery 2. Technically successful particle embolization of the anterior division of the right hepatic artery. PLAN: - The patient will be admitted overnight for continued observation PCA usage. - Patient will follow-up in the interventional radiology clinic in 3-4 weeks with postprocedural CMP level. -  Initial imaging will be performed in approximately 3 months (mid March). Patient was encouraged to keep his follow-up appointments with his oncologist, located at the [facility], scheduled for later this month. Electronically Signed   By: Sandi Mariscal M.D.   On: 07/25/2017 16:40   Ir Angiogram Selective Each Additional Vessel  Result Date: 07/25/2017 INDICATION: History of hepatocellular carcinoma. Patient presents today for bland embolization of known multifocal hepatocellular carcinoma. Please refer to formal consultation in the [facility] epic EMR dated 06/28/2017 for additional details. EXAM: 1. ULTRASOUND GUIDANCE FOR ARTERIAL ACCESS 2. SELECTIVE SUPERIOR MESENTERIC ARTERIOGRAM 3. SELECTIVE CELIAC ARTERIOGRAM 4. SELECTIVE PROPER HEPATIC ARTERIOGRAM 5. SELECTIVE ACCESSORY RIGHT HEPATIC ARTERIOGRAM AND PERCUTANEOUS PARTICLE EMBOLIZATION 6. SELECTIVE RIGHT POSTERIOR DIVISION ARTERIOGRAM 7. SELECTIVE RIGHT ANTERIOR DIVISION ARTERIOGRAM AND PERCUTANEOUS PARTICLE EMBOLIZATION COMPARISON:  CTA of the abdomen and pelvis - 07/07/2017; abdominal MRI - 06/28/2017; 04/05/2017 MEDICATIONS: Zosyn 3.375 g IV; the antibiotic was administered within 1 hour of the procedure ANESTHESIA/SEDATION: Moderate (conscious) sedation was employed during this procedure. A total of Versed 3 mg and Fentanyl 150 mcg was administered intravenously. Moderate Sedation Time: 100 minutes. The patient's level of consciousness and vital signs were monitored continuously by radiology nursing throughout the  procedure under my direct supervision. CONTRAST:  160 cc Isovue 300 FLUOROSCOPY TIME:  22 minutes 30 seconds (2348 mGy). COMPLICATIONS: SIR Level A - No therapy, no consequence. A small hematoma was noted at the right groin access site following approximately 15 minutes of manual compression. As such, the borders of the hematoma were demarcated with a pen and an additional 10 minutes of manual compression was performed ultimately achieving hemostasis. A dressing was placed. The patient otherwise tolerated procedure well without immediate complication. PROCEDURE: Informed consent was obtained from the patient following explanation of the procedure, risks, benefits and alternatives. The patient understands, agrees and consents for the procedure. All questions were addressed. A time out was performed prior to the initiation of the procedure. Maximal barrier sterile technique utilized including caps, mask, sterile gowns, sterile gloves, large sterile drape, hand hygiene, and Betadine prep. The right femoral head was marked fluoroscopically. Under ultrasound guidance, the right common femoral artery was accessed with a micropuncture kit after the overlying soft tissues were anesthetized with 1% lidocaine. An ultrasound image was saved for documentation purposes. The micropuncture sheath was exchanged for a 5 [place] vascular sheath over a Bentson wire. A closure arteriogram was performed through the side of the sheath confirming access within the right common femoral artery. Over a Bentson wire, a Mickelson catheter was advanced to the level of the thoracic aorta where it was back bled and flushed. The catheter was then utilized to select the superior mesenteric artery. Selective superior mesenteric arteriogram was performed with delayed portal venous imaging. The Mickelson catheter was then advanced cranially to select the celiac artery and a selective celiac arteriogram was performed. With the use of an 00.14 Fathom  wire, a high-flow Renegade microcatheter was advanced into the proper hepatic artery and a selective proper hepatic arteriogram was performed. The microcatheter was utilized to select the accessory right hepatic artery. Contrast injection confirmed appropriate positioning and the vessel was embolized with 100-300 micron Embospheres. The microcatheter was retracted into the origin of the vessel and post embolization angiogram was performed. Next, a selective right hepatic arteriogram was performed. The microcatheter was utilized select both the anterior and posterior divisions of the right hepatic arteries and selective arteriograms were performed at both of these locations. Ultimately, it was  determined that there was arterial supply to the residual tumor about the ablation site was supplied via the anterior division of the right hepatic artery (as was confirmed on preceding abdominal CTA). As such, the microcatheter was advanced into the anterior division of the right hepatic artery at the level of the vessel's bifurcation. Contrast injection confirmed appropriate positioning and the vessel was embolized to near stasis with 300 of 500 micron Embospheres. The microcatheter was retracted into the right hepatic artery and post embolization arteriogram was performed. Images were reviewed and the procedure was terminated. All wires, catheters and sheaths were removed from the patient. Hemostasis was achieved at the right groin access site with manual compression. A small hematoma was noted at the right groin access site following approximately 15 minutes of manual compression. As such, the borders of the hematoma were demarcated with a pen and an additional 10 minutes of manual compression was performed ultimately achieving hemostasis. A dressing was placed. The patient otherwise tolerated procedure well without immediate complication. FINDINGS: Selective superior mesenteric arteriogram demonstrates conventional  branching pattern without accessory or replaced hepatic arterial supply. Delayed portal venous imaging demonstrates patency of the main, right and left portal veins. Celiac arteriogram demonstrates an accessory right hepatic artery supplying the medial aspect the right lobe of the liver as was demonstrated on preceding abdominal CTA. Delayed portal venous imaging demonstrates patency of the main, right and left portal veins. Selective accessory right hepatic arteriogram demonstrates arterial supply to the majority of both the residual enhancing tumor about the ablation site as well as an additional ill-defined lesion within the dome of the caudate lobe (seen on image 39, series 40 of preceding abdominal CTA). This vessel was subsequently embolized to stasis with 100-300 micron Embospheres. Dedicated arteriograms of both the anterior and posterior division of the right hepatic artery were performed in various obliquities, ultimately demonstrating ill-defined arterial supply to the residual tumor about the ablation site supplied by the anterior division of the right hepatic artery as was confirmed on preceding CTA. As such, the anterior division of the right hepatic artery was successfully embolized with 300 - 500 micron Embospheres to near vessel stasis. It is felt that an additional small lesion within the posterior inferior aspect of the right lobe of the liver is likely supplied via the posterior division of the right hepatic artery, however dedicated embolization was not performed dislocation given patient's mildly elevated bilirubin level (preprocedural bilirubin was 1.5). IMPRESSION: 1. Technically successful particle embolization of the accessory right hepatic artery 2. Technically successful particle embolization of the anterior division of the right hepatic artery. PLAN: - The patient will be admitted overnight for continued observation PCA usage. - Patient will follow-up in the interventional radiology  clinic in 3-4 weeks with postprocedural CMP level. - Initial imaging will be performed in approximately 3 months (mid March). Patient was encouraged to keep his follow-up appointments with his oncologist, located at the [facility], scheduled for later this month. Electronically Signed   By: Sandi Mariscal M.D.   On: 07/25/2017 16:40   Ir Angiogram Selective Each Additional Vessel  Result Date: 07/25/2017 INDICATION: History of hepatocellular carcinoma. Patient presents today for bland embolization of known multifocal hepatocellular carcinoma. Please refer to formal consultation in the [facility] epic EMR dated 06/28/2017 for additional details. EXAM: 1. ULTRASOUND GUIDANCE FOR ARTERIAL ACCESS 2. SELECTIVE SUPERIOR MESENTERIC ARTERIOGRAM 3. SELECTIVE CELIAC ARTERIOGRAM 4. SELECTIVE PROPER HEPATIC ARTERIOGRAM 5. SELECTIVE ACCESSORY RIGHT HEPATIC ARTERIOGRAM AND PERCUTANEOUS PARTICLE EMBOLIZATION 6. SELECTIVE RIGHT  POSTERIOR DIVISION ARTERIOGRAM 7. SELECTIVE RIGHT ANTERIOR DIVISION ARTERIOGRAM AND PERCUTANEOUS PARTICLE EMBOLIZATION COMPARISON:  CTA of the abdomen and pelvis - 07/07/2017; abdominal MRI - 06/28/2017; 04/05/2017 MEDICATIONS: Zosyn 3.375 g IV; the antibiotic was administered within 1 hour of the procedure ANESTHESIA/SEDATION: Moderate (conscious) sedation was employed during this procedure. A total of Versed 3 mg and Fentanyl 150 mcg was administered intravenously. Moderate Sedation Time: 100 minutes. The patient's level of consciousness and vital signs were monitored continuously by radiology nursing throughout the procedure under my direct supervision. CONTRAST:  160 cc Isovue 300 FLUOROSCOPY TIME:  22 minutes 30 seconds (2348 mGy). COMPLICATIONS: SIR Level A - No therapy, no consequence. A small hematoma was noted at the right groin access site following approximately 15 minutes of manual compression. As such, the borders of the hematoma were demarcated with a pen and an additional 10 minutes of manual  compression was performed ultimately achieving hemostasis. A dressing was placed. The patient otherwise tolerated procedure well without immediate complication. PROCEDURE: Informed consent was obtained from the patient following explanation of the procedure, risks, benefits and alternatives. The patient understands, agrees and consents for the procedure. All questions were addressed. A time out was performed prior to the initiation of the procedure. Maximal barrier sterile technique utilized including caps, mask, sterile gowns, sterile gloves, large sterile drape, hand hygiene, and Betadine prep. The right femoral head was marked fluoroscopically. Under ultrasound guidance, the right common femoral artery was accessed with a micropuncture kit after the overlying soft tissues were anesthetized with 1% lidocaine. An ultrasound image was saved for documentation purposes. The micropuncture sheath was exchanged for a 5 [place] vascular sheath over a Bentson wire. A closure arteriogram was performed through the side of the sheath confirming access within the right common femoral artery. Over a Bentson wire, a Mickelson catheter was advanced to the level of the thoracic aorta where it was back bled and flushed. The catheter was then utilized to select the superior mesenteric artery. Selective superior mesenteric arteriogram was performed with delayed portal venous imaging. The Mickelson catheter was then advanced cranially to select the celiac artery and a selective celiac arteriogram was performed. With the use of an 00.14 Fathom wire, a high-flow Renegade microcatheter was advanced into the proper hepatic artery and a selective proper hepatic arteriogram was performed. The microcatheter was utilized to select the accessory right hepatic artery. Contrast injection confirmed appropriate positioning and the vessel was embolized with 100-300 micron Embospheres. The microcatheter was retracted into the origin of the vessel and  post embolization angiogram was performed. Next, a selective right hepatic arteriogram was performed. The microcatheter was utilized select both the anterior and posterior divisions of the right hepatic arteries and selective arteriograms were performed at both of these locations. Ultimately, it was determined that there was arterial supply to the residual tumor about the ablation site was supplied via the anterior division of the right hepatic artery (as was confirmed on preceding abdominal CTA). As such, the microcatheter was advanced into the anterior division of the right hepatic artery at the level of the vessel's bifurcation. Contrast injection confirmed appropriate positioning and the vessel was embolized to near stasis with 300 of 500 micron Embospheres. The microcatheter was retracted into the right hepatic artery and post embolization arteriogram was performed. Images were reviewed and the procedure was terminated. All wires, catheters and sheaths were removed from the patient. Hemostasis was achieved at the right groin access site with manual compression. A small hematoma was noted  at the right groin access site following approximately 15 minutes of manual compression. As such, the borders of the hematoma were demarcated with a pen and an additional 10 minutes of manual compression was performed ultimately achieving hemostasis. A dressing was placed. The patient otherwise tolerated procedure well without immediate complication. FINDINGS: Selective superior mesenteric arteriogram demonstrates conventional branching pattern without accessory or replaced hepatic arterial supply. Delayed portal venous imaging demonstrates patency of the main, right and left portal veins. Celiac arteriogram demonstrates an accessory right hepatic artery supplying the medial aspect the right lobe of the liver as was demonstrated on preceding abdominal CTA. Delayed portal venous imaging demonstrates patency of the main, right  and left portal veins. Selective accessory right hepatic arteriogram demonstrates arterial supply to the majority of both the residual enhancing tumor about the ablation site as well as an additional ill-defined lesion within the dome of the caudate lobe (seen on image 39, series 40 of preceding abdominal CTA). This vessel was subsequently embolized to stasis with 100-300 micron Embospheres. Dedicated arteriograms of both the anterior and posterior division of the right hepatic artery were performed in various obliquities, ultimately demonstrating ill-defined arterial supply to the residual tumor about the ablation site supplied by the anterior division of the right hepatic artery as was confirmed on preceding CTA. As such, the anterior division of the right hepatic artery was successfully embolized with 300 - 500 micron Embospheres to near vessel stasis. It is felt that an additional small lesion within the posterior inferior aspect of the right lobe of the liver is likely supplied via the posterior division of the right hepatic artery, however dedicated embolization was not performed dislocation given patient's mildly elevated bilirubin level (preprocedural bilirubin was 1.5). IMPRESSION: 1. Technically successful particle embolization of the accessory right hepatic artery 2. Technically successful particle embolization of the anterior division of the right hepatic artery. PLAN: - The patient will be admitted overnight for continued observation PCA usage. - Patient will follow-up in the interventional radiology clinic in 3-4 weeks with postprocedural CMP level. - Initial imaging will be performed in approximately 3 months (mid March). Patient was encouraged to keep his follow-up appointments with his oncologist, located at the [facility], scheduled for later this month. Electronically Signed   By: Sandi Mariscal M.D.   On: 07/25/2017 16:40   Ir US Guide Vasc Access Right  Result Date: 07/25/2017 INDICATION:  History of hepatocellular carcinoma. Patient presents today for bland embolization of known multifocal hepatocellular carcinoma. Please refer to formal consultation in the [facility] epic EMR dated 06/28/2017 for additional details. EXAM: 1. ULTRASOUND GUIDANCE FOR ARTERIAL ACCESS 2. SELECTIVE SUPERIOR MESENTERIC ARTERIOGRAM 3. SELECTIVE CELIAC ARTERIOGRAM 4. SELECTIVE PROPER HEPATIC ARTERIOGRAM 5. SELECTIVE ACCESSORY RIGHT HEPATIC ARTERIOGRAM AND PERCUTANEOUS PARTICLE EMBOLIZATION 6. SELECTIVE RIGHT POSTERIOR DIVISION ARTERIOGRAM 7. SELECTIVE RIGHT ANTERIOR DIVISION ARTERIOGRAM AND PERCUTANEOUS PARTICLE EMBOLIZATION COMPARISON:  CTA of the abdomen and pelvis - 07/07/2017; abdominal MRI - 06/28/2017; 04/05/2017 MEDICATIONS: Zosyn 3.375 g IV; the antibiotic was administered within 1 hour of the procedure ANESTHESIA/SEDATION: Moderate (conscious) sedation was employed during this procedure. A total of Versed 3 mg and Fentanyl 150 mcg was administered intravenously. Moderate Sedation Time: 100 minutes. The patient's level of consciousness and vital signs were monitored continuously by radiology nursing throughout the procedure under my direct supervision. CONTRAST:  160 cc Isovue 300 FLUOROSCOPY TIME:  22 minutes 30 seconds (2348 mGy). COMPLICATIONS: SIR Level A - No therapy, no consequence. A small hematoma was noted at the right  groin access site following approximately 15 minutes of manual compression. As such, the borders of the hematoma were demarcated with a pen and an additional 10 minutes of manual compression was performed ultimately achieving hemostasis. A dressing was placed. The patient otherwise tolerated procedure well without immediate complication. PROCEDURE: Informed consent was obtained from the patient following explanation of the procedure, risks, benefits and alternatives. The patient understands, agrees and consents for the procedure. All questions were addressed. A time out was performed prior to the  initiation of the procedure. Maximal barrier sterile technique utilized including caps, mask, sterile gowns, sterile gloves, large sterile drape, hand hygiene, and Betadine prep. The right femoral head was marked fluoroscopically. Under ultrasound guidance, the right common femoral artery was accessed with a micropuncture kit after the overlying soft tissues were anesthetized with 1% lidocaine. An ultrasound image was saved for documentation purposes. The micropuncture sheath was exchanged for a 5 [place] vascular sheath over a Bentson wire. A closure arteriogram was performed through the side of the sheath confirming access within the right common femoral artery. Over a Bentson wire, a Mickelson catheter was advanced to the level of the thoracic aorta where it was back bled and flushed. The catheter was then utilized to select the superior mesenteric artery. Selective superior mesenteric arteriogram was performed with delayed portal venous imaging. The Mickelson catheter was then advanced cranially to select the celiac artery and a selective celiac arteriogram was performed. With the use of an 00.14 Fathom wire, a high-flow Renegade microcatheter was advanced into the proper hepatic artery and a selective proper hepatic arteriogram was performed. The microcatheter was utilized to select the accessory right hepatic artery. Contrast injection confirmed appropriate positioning and the vessel was embolized with 100-300 micron Embospheres. The microcatheter was retracted into the origin of the vessel and post embolization angiogram was performed. Next, a selective right hepatic arteriogram was performed. The microcatheter was utilized select both the anterior and posterior divisions of the right hepatic arteries and selective arteriograms were performed at both of these locations. Ultimately, it was determined that there was arterial supply to the residual tumor about the ablation site was supplied via the anterior  division of the right hepatic artery (as was confirmed on preceding abdominal CTA). As such, the microcatheter was advanced into the anterior division of the right hepatic artery at the level of the vessel's bifurcation. Contrast injection confirmed appropriate positioning and the vessel was embolized to near stasis with 300 of 500 micron Embospheres. The microcatheter was retracted into the right hepatic artery and post embolization arteriogram was performed. Images were reviewed and the procedure was terminated. All wires, catheters and sheaths were removed from the patient. Hemostasis was achieved at the right groin access site with manual compression. A small hematoma was noted at the right groin access site following approximately 15 minutes of manual compression. As such, the borders of the hematoma were demarcated with a pen and an additional 10 minutes of manual compression was performed ultimately achieving hemostasis. A dressing was placed. The patient otherwise tolerated procedure well without immediate complication. FINDINGS: Selective superior mesenteric arteriogram demonstrates conventional branching pattern without accessory or replaced hepatic arterial supply. Delayed portal venous imaging demonstrates patency of the main, right and left portal veins. Celiac arteriogram demonstrates an accessory right hepatic artery supplying the medial aspect the right lobe of the liver as was demonstrated on preceding abdominal CTA. Delayed portal venous imaging demonstrates patency of the main, right and left portal veins. Selective accessory right  hepatic arteriogram demonstrates arterial supply to the majority of both the residual enhancing tumor about the ablation site as well as an additional ill-defined lesion within the dome of the caudate lobe (seen on image 39, series 40 of preceding abdominal CTA). This vessel was subsequently embolized to stasis with 100-300 micron Embospheres. Dedicated arteriograms of  both the anterior and posterior division of the right hepatic artery were performed in various obliquities, ultimately demonstrating ill-defined arterial supply to the residual tumor about the ablation site supplied by the anterior division of the right hepatic artery as was confirmed on preceding CTA. As such, the anterior division of the right hepatic artery was successfully embolized with 300 - 500 micron Embospheres to near vessel stasis. It is felt that an additional small lesion within the posterior inferior aspect of the right lobe of the liver is likely supplied via the posterior division of the right hepatic artery, however dedicated embolization was not performed dislocation given patient's mildly elevated bilirubin level (preprocedural bilirubin was 1.5). IMPRESSION: 1. Technically successful particle embolization of the accessory right hepatic artery 2. Technically successful particle embolization of the anterior division of the right hepatic artery. PLAN: - The patient will be admitted overnight for continued observation PCA usage. - Patient will follow-up in the interventional radiology clinic in 3-4 weeks with postprocedural CMP level. - Initial imaging will be performed in approximately 3 months (mid March). Patient was encouraged to keep his follow-up appointments with his oncologist, located at the [facility], scheduled for later this month. Electronically Signed   By: Sandi Mariscal M.D.   On: 07/25/2017 16:40   Ir Embo Tumor Organ Ischemia Infarct Inc Guide Roadmapping  Result Date: 07/25/2017 INDICATION: History of hepatocellular carcinoma. Patient presents today for bland embolization of known multifocal hepatocellular carcinoma. Please refer to formal consultation in the [facility] epic EMR dated 06/28/2017 for additional details. EXAM: 1. ULTRASOUND GUIDANCE FOR ARTERIAL ACCESS 2. SELECTIVE SUPERIOR MESENTERIC ARTERIOGRAM 3. SELECTIVE CELIAC ARTERIOGRAM 4. SELECTIVE PROPER HEPATIC ARTERIOGRAM 5.  SELECTIVE ACCESSORY RIGHT HEPATIC ARTERIOGRAM AND PERCUTANEOUS PARTICLE EMBOLIZATION 6. SELECTIVE RIGHT POSTERIOR DIVISION ARTERIOGRAM 7. SELECTIVE RIGHT ANTERIOR DIVISION ARTERIOGRAM AND PERCUTANEOUS PARTICLE EMBOLIZATION COMPARISON:  CTA of the abdomen and pelvis - 07/07/2017; abdominal MRI - 06/28/2017; 04/05/2017 MEDICATIONS: Zosyn 3.375 g IV; the antibiotic was administered within 1 hour of the procedure ANESTHESIA/SEDATION: Moderate (conscious) sedation was employed during this procedure. A total of Versed 3 mg and Fentanyl 150 mcg was administered intravenously. Moderate Sedation Time: 100 minutes. The patient's level of consciousness and vital signs were monitored continuously by radiology nursing throughout the procedure under my direct supervision. CONTRAST:  160 cc Isovue 300 FLUOROSCOPY TIME:  22 minutes 30 seconds (2348 mGy). COMPLICATIONS: SIR Level A - No therapy, no consequence. A small hematoma was noted at the right groin access site following approximately 15 minutes of manual compression. As such, the borders of the hematoma were demarcated with a pen and an additional 10 minutes of manual compression was performed ultimately achieving hemostasis. A dressing was placed. The patient otherwise tolerated procedure well without immediate complication. PROCEDURE: Informed consent was obtained from the patient following explanation of the procedure, risks, benefits and alternatives. The patient understands, agrees and consents for the procedure. All questions were addressed. A time out was performed prior to the initiation of the procedure. Maximal barrier sterile technique utilized including caps, mask, sterile gowns, sterile gloves, large sterile drape, hand hygiene, and Betadine prep. The right femoral head was marked fluoroscopically. Under ultrasound guidance,  the right common femoral artery was accessed with a micropuncture kit after the overlying soft tissues were anesthetized with 1% lidocaine.  An ultrasound image was saved for documentation purposes. The micropuncture sheath was exchanged for a 5 [place] vascular sheath over a Bentson wire. A closure arteriogram was performed through the side of the sheath confirming access within the right common femoral artery. Over a Bentson wire, a Mickelson catheter was advanced to the level of the thoracic aorta where it was back bled and flushed. The catheter was then utilized to select the superior mesenteric artery. Selective superior mesenteric arteriogram was performed with delayed portal venous imaging. The Mickelson catheter was then advanced cranially to select the celiac artery and a selective celiac arteriogram was performed. With the use of an 00.14 Fathom wire, a high-flow Renegade microcatheter was advanced into the proper hepatic artery and a selective proper hepatic arteriogram was performed. The microcatheter was utilized to select the accessory right hepatic artery. Contrast injection confirmed appropriate positioning and the vessel was embolized with 100-300 micron Embospheres. The microcatheter was retracted into the origin of the vessel and post embolization angiogram was performed. Next, a selective right hepatic arteriogram was performed. The microcatheter was utilized select both the anterior and posterior divisions of the right hepatic arteries and selective arteriograms were performed at both of these locations. Ultimately, it was determined that there was arterial supply to the residual tumor about the ablation site was supplied via the anterior division of the right hepatic artery (as was confirmed on preceding abdominal CTA). As such, the microcatheter was advanced into the anterior division of the right hepatic artery at the level of the vessel's bifurcation. Contrast injection confirmed appropriate positioning and the vessel was embolized to near stasis with 300 of 500 micron Embospheres. The microcatheter was retracted into the right  hepatic artery and post embolization arteriogram was performed. Images were reviewed and the procedure was terminated. All wires, catheters and sheaths were removed from the patient. Hemostasis was achieved at the right groin access site with manual compression. A small hematoma was noted at the right groin access site following approximately 15 minutes of manual compression. As such, the borders of the hematoma were demarcated with a pen and an additional 10 minutes of manual compression was performed ultimately achieving hemostasis. A dressing was placed. The patient otherwise tolerated procedure well without immediate complication. FINDINGS: Selective superior mesenteric arteriogram demonstrates conventional branching pattern without accessory or replaced hepatic arterial supply. Delayed portal venous imaging demonstrates patency of the main, right and left portal veins. Celiac arteriogram demonstrates an accessory right hepatic artery supplying the medial aspect the right lobe of the liver as was demonstrated on preceding abdominal CTA. Delayed portal venous imaging demonstrates patency of the main, right and left portal veins. Selective accessory right hepatic arteriogram demonstrates arterial supply to the majority of both the residual enhancing tumor about the ablation site as well as an additional ill-defined lesion within the dome of the caudate lobe (seen on image 39, series 40 of preceding abdominal CTA). This vessel was subsequently embolized to stasis with 100-300 micron Embospheres. Dedicated arteriograms of both the anterior and posterior division of the right hepatic artery were performed in various obliquities, ultimately demonstrating ill-defined arterial supply to the residual tumor about the ablation site supplied by the anterior division of the right hepatic artery as was confirmed on preceding CTA. As such, the anterior division of the right hepatic artery was successfully embolized with 300 -  500 micron  Embospheres to near vessel stasis. It is felt that an additional small lesion within the posterior inferior aspect of the right lobe of the liver is likely supplied via the posterior division of the right hepatic artery, however dedicated embolization was not performed dislocation given patient's mildly elevated bilirubin level (preprocedural bilirubin was 1.5). IMPRESSION: 1. Technically successful particle embolization of the accessory right hepatic artery 2. Technically successful particle embolization of the anterior division of the right hepatic artery. PLAN: - The patient will be admitted overnight for continued observation PCA usage. - Patient will follow-up in the interventional radiology clinic in 3-4 weeks with postprocedural CMP level. - Initial imaging will be performed in approximately 3 months (mid March). Patient was encouraged to keep his follow-up appointments with his oncologist, located at the [facility], scheduled for later this month. Electronically Signed   By: Sandi Mariscal M.D.   On: 07/25/2017 16:40    Labs:  CBC: Recent Labs    10/04/16 1049 07/25/17 1146  WBC 4.5 6.4  HGB 12.2* 13.4  HCT 35.2* 37.8*  PLT 74* 78*    COAGS: Recent Labs    10/04/16 1049 07/25/17 1146  INR 1.1 1.14    BMP: Recent Labs    10/04/16 1049 12/16/16 1015 07/25/17 1146  NA 138 136 139  K 4.4 3.9 4.1  CL 103 99 103  CO2 _0 GLUCOSE 98 77 83  BUN _1 CALCIUM 9.6 9.0 9.5  CREATININE 0.93 1.12 1.08  GFRNONAA  --   --  >60  GFRAA  --   --  >60    LIVER FUNCTION TESTS: Recent Labs    10/04/16 1049 12/16/16 1015 07/25/17 1146  BILITOT 1.2 1.3* 1.5*  AST 42* 35 50*  ALT 27 22 34  ALKPHOS 114 99 122  PROT 6.5 6.4 7.6  ALBUMIN 4.3 4.2 4.6    TUMOR MARKERS: Recent Labs    10/04/16 1049 12/16/16 1015  AFPTM 12.4* 17.0*    Assessment and Plan:  Mike Ferguson is a 77 y.o. male with past medical history significant for hypertension, esophageal  varices and alcoholic cirrhosis (patient has not consumed alcohol since 2011) who returns today following bland hepatic embolization performed 07/25/2017 for progression of residual disease surrounding the prior hepatic ablation site as well as development of multifocal HCC.  On physical examination, there is a minimal amount of residual erythema and bruising at the level of the right groin compatible with a resolved access site hematoma. There is no palpable hematoma on today's examination. The right common femoral artery pulse is easily palpable.  I explained that the patient could utilize alternating ice and heat pads to help further reduce residual bruising.  The patient has otherwise recovered completely from the procedure and is without complaint.  Selected images from hepatic arteriogram and bland embolization performed 07/25/2017 were reviewed in detail with the patient and the patient's wife.  I again explained to the patient that bland embolization is performed for palliative purposes with the hopes of achieving disease stability to improvement for several months.  The patient and the patient's wife demonstrated excellent understanding of this conversation.  The patient will be seen in consultation found the acquisition of post procedural MRI 3 months from the time of the embolization (early April 2019). Repeat CMP and AFP levels will be obtained at the time of the MRI.  Patient was encouraged to call the interventional radiology clinic with any interval questions or concerns.  Thank you for this interesting consult.  I greatly enjoyed meeting Mike Ferguson and look forward to participating in their care.  A copy of this report was sent to the requesting provider on this date.  Electronically Signed: Sandi Mariscal 08/18/2017, 9:38 AM   I spent a total of 25 Minutes in face to face in clinical consultation, greater than 50% of which was counseling/coordinating care for post hepatic bland  embolization.

## 2017-08-24 DIAGNOSIS — H25813 Combined forms of age-related cataract, bilateral: Secondary | ICD-10-CM | POA: Diagnosis not present

## 2017-08-24 DIAGNOSIS — H527 Unspecified disorder of refraction: Secondary | ICD-10-CM | POA: Diagnosis not present

## 2017-08-24 DIAGNOSIS — H52213 Irregular astigmatism, bilateral: Secondary | ICD-10-CM | POA: Diagnosis not present

## 2017-09-16 IMAGING — CT CT GUIDANCE TISSUE ABLATION
1 of 19 series · 3 of 32 positions shown, 7 images · non-contrast
Comparison: Abdominal MRI - 06/17/2016;

INDICATION: History of cirrhosis, now with an enhancing liver lesion compatible
with hepatocellular carcinoma. Patient presents today for ultrasound
and CT-guided percutaneous microwave liver lesion ablation.

Please refer to formal consultation within the [REDACTED] EMR performed on
06/17/2016 for additional details.
EXAM:
CT-GUIDED PERCUTANEOUS THERMAL ABLATION OF LIVER LESION

[Series 2: i-spiral 5.0 b31f · axial · 0.68mm/px · z∈[-173,-65]mm · 3 of 63 slices shown, 7 images]
[im 16/63  soft-tissue]
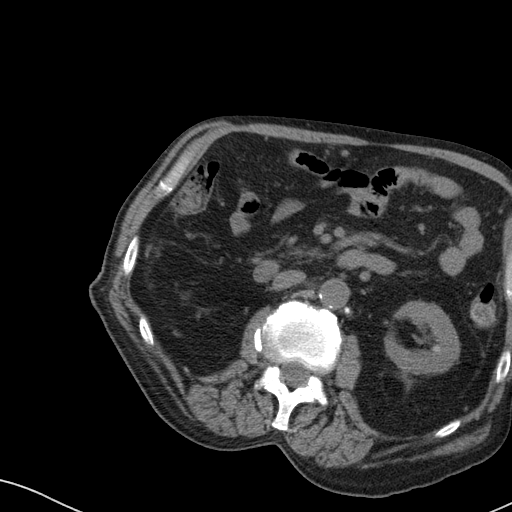
[im 16/63  lung]
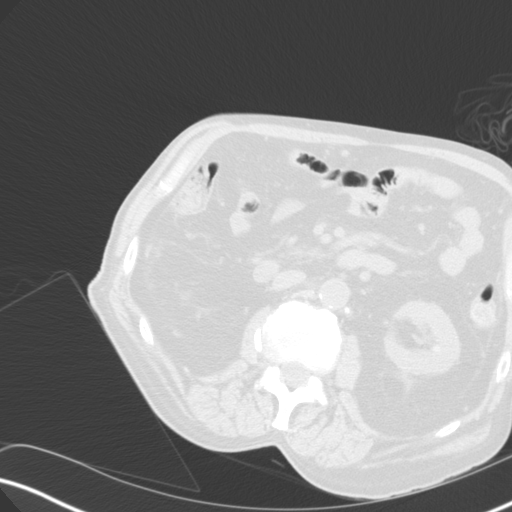
[im 16/63  bone]
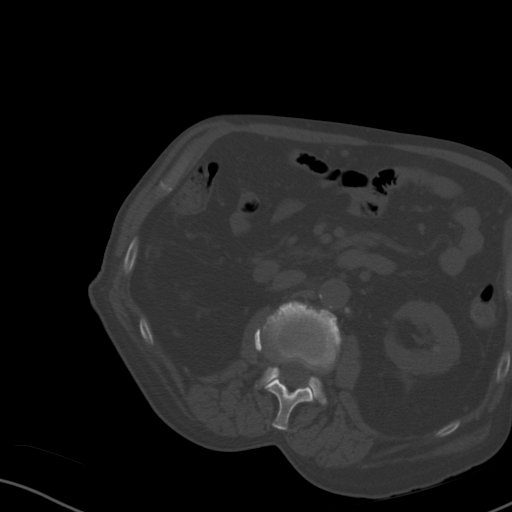
[im 32/63  soft-tissue]
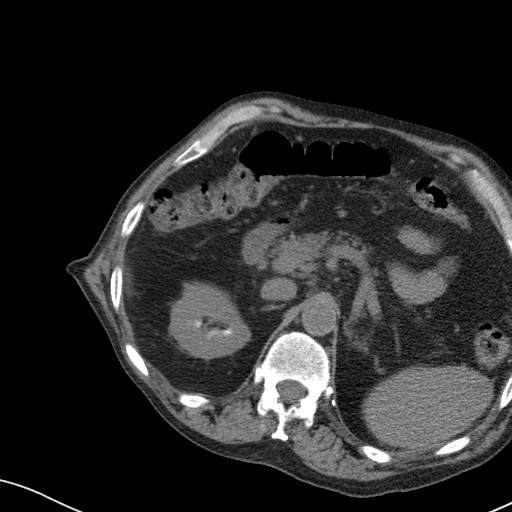
[im 32/63  lung]
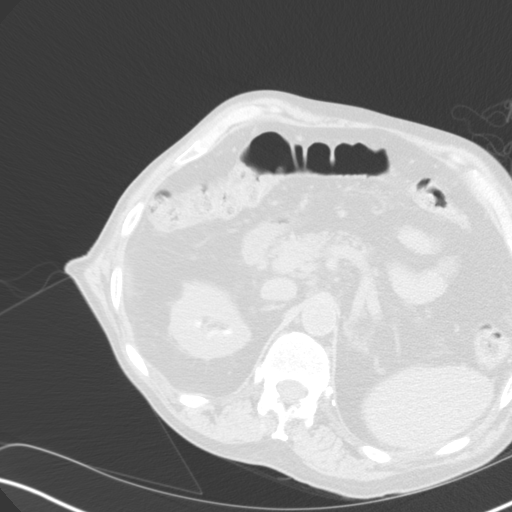
[im 47/63  soft-tissue]
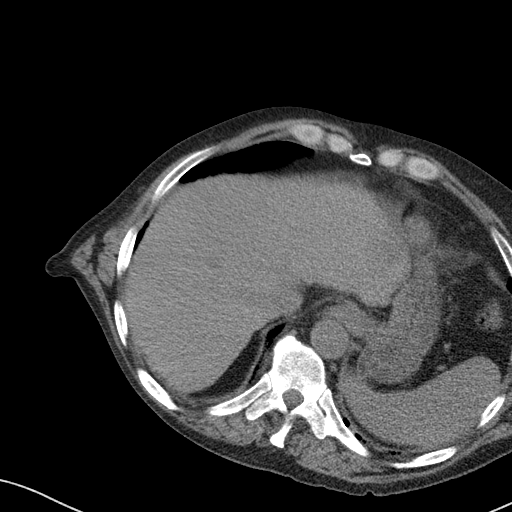
[im 47/63  lung]
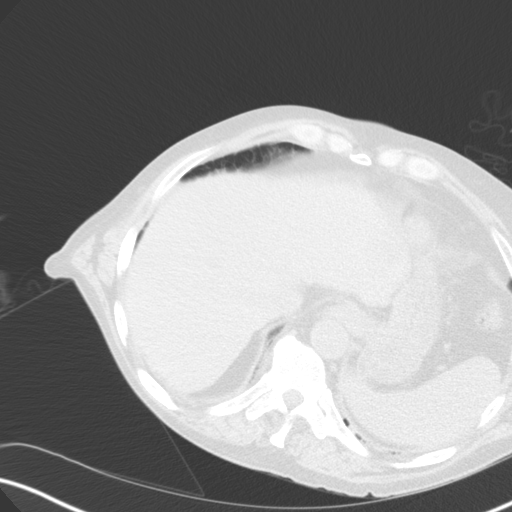

[3 of 32 positions shown; findings below may reference images not displayed]

cirrhotic protocol
abdominal CT - 05/17/2016

ANESTHESIA/SEDATION:
General

MEDICATIONS:
Rocephin 1 gm IV. The antibiotic was administered in an appropriate
time interval prior to needle puncture of the skin.

CONTRAST:  100mL [id number] IOPAMIDOL ([id number]) INJECTION 61%

PROCEDURE:
The procedure, risks, benefits, and alternatives were explained to
the patient. Questions regarding the procedure were encouraged and
answered. The patient understands and consents to the procedure.

The patient was placed under general anesthesia. Initial unenhanced
CT was performed in a supine position to localize ill-defined lesion
within the peripheral/subcapsular aspect of the dome of the right
lobe of the liver with dominant ill-defined component measured
approximately 1.2 cm in diameter (image 8, series 3). The procedure
was planned.

The patient was prepped with Betadine in a sterile fashion, and a
sterile drape was applied covering the operative field. A sterile
gown and sterile gloves were used for the procedure.

Under direct ultrasound guidance, a 22 gauge spinal needle was
advanced in expected trajectory of the microwave ablation probe for
procedural planning. A contrast-enhanced CT scan was performed to
ensure adequate lesion location.

Next, a XT probe was advanced directed towards the ill-defined
lesion utilizing a combination of ultrasound and CT guidance. Once
appropriate positioning was obtained, the probe was stuck in place.

A 22 gauge spinal needle was placed about the liver edge for the
purposes of hydrodissection. Note, initial attempts proved
unsuccessful including the administration of a small amount of air
in the portal venous system, however ultimately, an adequate
hydrodissection was achieved about the liver edge.

A post ablation contrast-enhanced CT scan was performed and the
decision was made to perform an additional 3 minutes ablation.

Following a total of a 10 minute ablation, tract ablation was
performed as the microwave ablation probe was removed.

Superficial hemostasis was achieved with manual compression.

Noncontrast postprocedural imaging was obtained in the procedure was
terminated. A dressing was placed. The patient tolerated the
procedure well without immediate postprocedural complication.

COMPLICATIONS:
None immediate.
FINDINGS: Preprocedural imaging demonstrates a ill-defined approximately
cm lesion within the peripheral/subcapsular aspect of the dome of
the right lobe of the liver (image 8, series 3).

Under direct ultrasound guidance, a 22 gauge spinal needle was
utilized for the purposes of procedural planning. Contrast-enhanced
CT scan demonstrates the known ill-defined approximately 1.1 x
cm lesion about the cranial tip of the spinal needle (image 7,
series 6).

Under a combination of ultrasound and CT guidance, a XT probe was
advanced into the ill-defined hepatic lesion. Following
administration of hydrodissection, a 7 minute ablation cycle was
performed. Contrast imaging suggested the ablation zone encompassed
the location of the ill-defined hepatic lesion however as the lesion
is very ill-defined, the decision was made to precede with an 3
additional minutes of ablation.

Postprocedural noncontrast imaging demonstrates a technical success
without evidence of complication. Specifically, no evidence of
pneumothorax or significant perihepatic hemorrhage.
IMPRESSION: Technically successful ultrasound an CT guided percutaneous thermal
ablation of ill-defined lesion within the subcapsular aspect of the
dome of the right lobe of the liver.

PLAN:
- The patient will be observed overnight.

- The patient be seen in [REDACTED] in
approximately 3-4 weeks. Pre appointment CMP level be obtained. No
imaging is necessary for this initial postprocedural evaluation.

- Initial surveillance imaging (likely with MRI) will be performed
in 3 months (September 2016).

## 2017-09-22 DIAGNOSIS — H52213 Irregular astigmatism, bilateral: Secondary | ICD-10-CM | POA: Diagnosis not present

## 2017-09-22 DIAGNOSIS — H5703 Miosis: Secondary | ICD-10-CM | POA: Diagnosis not present

## 2017-09-22 DIAGNOSIS — H2513 Age-related nuclear cataract, bilateral: Secondary | ICD-10-CM | POA: Diagnosis not present

## 2017-09-22 DIAGNOSIS — H25812 Combined forms of age-related cataract, left eye: Secondary | ICD-10-CM | POA: Diagnosis not present

## 2017-09-22 DIAGNOSIS — H527 Unspecified disorder of refraction: Secondary | ICD-10-CM | POA: Diagnosis not present

## 2017-09-22 DIAGNOSIS — K7460 Unspecified cirrhosis of liver: Secondary | ICD-10-CM | POA: Diagnosis not present

## 2017-09-29 DIAGNOSIS — H25811 Combined forms of age-related cataract, right eye: Secondary | ICD-10-CM | POA: Diagnosis not present

## 2017-09-29 DIAGNOSIS — H2181 Floppy iris syndrome: Secondary | ICD-10-CM | POA: Diagnosis not present

## 2017-09-29 DIAGNOSIS — H52213 Irregular astigmatism, bilateral: Secondary | ICD-10-CM | POA: Diagnosis not present

## 2017-09-29 DIAGNOSIS — H527 Unspecified disorder of refraction: Secondary | ICD-10-CM | POA: Diagnosis not present

## 2017-09-29 DIAGNOSIS — H2513 Age-related nuclear cataract, bilateral: Secondary | ICD-10-CM | POA: Diagnosis not present

## 2017-09-29 DIAGNOSIS — H25813 Combined forms of age-related cataract, bilateral: Secondary | ICD-10-CM | POA: Diagnosis not present

## 2017-10-20 ENCOUNTER — Other Ambulatory Visit (HOSPITAL_COMMUNITY): Payer: Self-pay | Admitting: Interventional Radiology

## 2017-10-20 ENCOUNTER — Other Ambulatory Visit: Payer: Self-pay | Admitting: *Deleted

## 2017-10-20 ENCOUNTER — Other Ambulatory Visit: Payer: Self-pay | Admitting: Radiology

## 2017-10-20 DIAGNOSIS — C220 Liver cell carcinoma: Secondary | ICD-10-CM

## 2017-10-24 ENCOUNTER — Other Ambulatory Visit: Payer: Self-pay | Admitting: Family Medicine

## 2017-10-24 DIAGNOSIS — C220 Liver cell carcinoma: Secondary | ICD-10-CM

## 2017-11-01 ENCOUNTER — Ambulatory Visit
Admission: RE | Admit: 2017-11-01 | Discharge: 2017-11-01 | Disposition: A | Discharge status: Home / Self Care | Payer: Non-veteran care | Source: Ambulatory Visit | Attending: Family Medicine | Admitting: Family Medicine

## 2017-11-01 DIAGNOSIS — C220 Liver cell carcinoma: Secondary | ICD-10-CM

## 2017-11-01 MED ORDER — GADOBENATE DIMEGLUMINE 529 MG/ML IV SOLN
13.0000 mL | Freq: Once | INTRAVENOUS | Status: AC | PRN
  Administered 2017-11-01: 13 mL via INTRAVENOUS

## 2017-11-17 ENCOUNTER — Ambulatory Visit
Admission: RE | Admit: 2017-11-17 | Discharge: 2017-11-17 | Disposition: A | Discharge status: Home / Self Care | Payer: Non-veteran care | Source: Ambulatory Visit | Attending: Interventional Radiology | Admitting: Interventional Radiology

## 2017-11-17 ENCOUNTER — Encounter: Payer: Self-pay | Admitting: Radiology

## 2017-11-17 DIAGNOSIS — C220 Liver cell carcinoma: Secondary | ICD-10-CM

## 2017-11-17 HISTORY — PX: IR RADIOLOGIST EVAL & MGMT: IMG5224

## 2017-11-17 NOTE — Progress Notes (Signed)
Patient ID: Mike Ferguson, male   DOB: 1941-01-19, 77 y.o.   MRN: [id number]         Chief Complaint: Progressive [place]  Referring Physician(s): [name] (GI)  History of Present Illness: Mike Ferguson is a 77 y.o. male with past male history significant for hypertension, esophageal varices and alcoholic cirrhosis (has not consumed alcohol since 2011), who has previously undergone hepatic microwave ablation on 06/25/2016 and most recently a bland hepatic embolization on 07/25/2017 and returns today following the acquisition of surveillance abdominal MRI performed 11/01/2017.  Patient is again accompanied by his wife though serves as his own historian.  Patient continues to admit to unchanged baseline level of mild fatigue.  Patient also reports decreased appetite with subjective weight loss of approximately 2 to 3 pounds.  Patient is otherwise without complaint.  Specifically, no increased abdominal girth.  No yellowing of the skin or eyes.  No change in mental status.  No chest pain or shortness of breath.  No fever or chills.  Past Medical History:  Diagnosis Date  . Arthritis    generalized-back  . Cirrhosis ([place])   . Cirrhosis, alcoholic ([place])   . Esophageal varices ([place])   . Hearing impaired person, bilateral    hearing aids bilaterally  . Hepatocellular carcinoma ([place])   . Hypertension     Past Surgical History:  Procedure Laterality Date  . HERNIA REPAIR     RIH  . IR ANGIOGRAM SELECTIVE EACH ADDITIONAL VESSEL  07/25/2017  . IR ANGIOGRAM SELECTIVE EACH ADDITIONAL VESSEL  07/25/2017  . IR ANGIOGRAM SELECTIVE EACH ADDITIONAL VESSEL  07/25/2017  . IR ANGIOGRAM SELECTIVE EACH ADDITIONAL VESSEL  07/25/2017  . IR ANGIOGRAM VISCERAL SELECTIVE  07/25/2017  . IR ANGIOGRAM VISCERAL SELECTIVE  07/25/2017  . IR EMBO TUMOR ORGAN ISCHEMIA INFARCT INC GUIDE ROADMAPPING  07/25/2017  . IR GENERIC HISTORICAL  06/17/2016   IR RADIOLOGIST EVAL & MGMT 06/17/2016 Sandi Mariscal, MD [facility] INTERV RAD  . IR GENERIC  HISTORICAL  07/21/2016   IR RADIOLOGIST EVAL & MGMT 07/21/2016 Sandi Mariscal, MD [facility] INTERV RAD  . IR RADIOLOGIST EVAL & MGMT  10/26/2016  . IR RADIOLOGIST EVAL & MGMT  04/05/2017  . IR RADIOLOGIST EVAL & MGMT  01/11/2017  . IR RADIOLOGIST EVAL & MGMT  06/28/2017  . IR RADIOLOGIST EVAL & MGMT  08/18/2017  . IR US GUIDE VASC ACCESS RIGHT  07/25/2017  . Left rotator cuff      Allergies: Erythromycin  Medications: Prior to Admission medications   Medication Sig Start Date End Date Taking? Authorizing Provider  cholecalciferol (VITAMIN D) 1000 units tablet Take 2,000 Units by mouth daily.   Yes [provider]  loratadine-pseudoephedrine (CLARITIN-D 24-HOUR) 10-240 MG 24 hr tablet Take 1 tablet by mouth daily as needed for allergies.    Yes [provider]  mirtazapine (REMERON) 15 MG tablet Take 15 mg by mouth at bedtime.   Yes [provider]  propranolol (INDERAL) 20 MG tablet Take 20 mg by mouth 2 (two) times daily. 05/31/16  Yes [provider]  rifaximin (XIFAXAN) 550 MG TABS tablet Take 550 mg by mouth 2 (two) times daily.   Yes [provider]  sodium chloride (OCEAN) 0.65 % SOLN nasal spray Place 1 spray into both nostrils as needed for congestion.   Yes [provider]  tamsulosin (FLOMAX) 0.4 MG CAPS capsule TAKE 1 capsule Once in the evening Orally 30 day(s) 05/20/17  Yes [provider]  traMADol (  ULTRAM) 50 MG tablet Take 50 mg by mouth every 6 (six) hours as needed for moderate pain or severe pain.   Yes [provider]  vitamin B-12 (CYANOCOBALAMIN) 100 MCG tablet Take 100 mcg by mouth daily.   Yes [provider]  vitamin C (ASCORBIC ACID) 500 MG tablet Take 500 mg by mouth daily.   Yes [provider]     No family history on file.  Social History   Socioeconomic History  . Marital status: Married    Spouse name: Not on file  . Number of children: Not on file  . Years of education: Not on  file  . Highest education level: Not on file  Occupational History  . Not on file  Social Needs  . Financial resource strain: Not on file  . Food insecurity:    Worry: Not on file    Inability: Not on file  . Transportation needs:    Medical: Not on file    Non-medical: Not on file  Tobacco Use  . Smoking status: Former Smoker    Last attempt to quit: 06/24/1986    Years since quitting: 31.4  . Smokeless tobacco: Never Used  Substance and Sexual Activity  . Alcohol use: No    Comment: 2011 Quit- ETOH abuse  . Drug use: No  . Sexual activity: Not Currently  Lifestyle  . Physical activity:    Days per week: Not on file    Minutes per session: Not on file  . Stress: Not on file  Relationships  . Social connections:    Talks on phone: Not on file    Gets together: Not on file    Attends religious service: Not on file    Active member of club or organization: Not on file    Attends meetings of clubs or organizations: Not on file    Relationship status: Not on file  Other Topics Concern  . Not on file  Social History Narrative  . Not on file    ECOG Status: 1 - Symptomatic but completely ambulatory  Review of Systems: A 12 point ROS discussed and pertinent positives are indicated in the HPI above.  All other systems are negative.  Review of Systems  Constitutional: Positive for appetite change and fatigue.       Patient admits to unchanged baseline decreased appetite and mild fatigue.  Respiratory: Negative.   Cardiovascular: Negative.   Gastrointestinal: Negative.  Negative for abdominal distention and abdominal pain.  Skin: Negative.   Psychiatric/Behavioral: Negative.     Vital Signs: BP 133/69   Pulse (!) 49   Temp 97.7 F (36.5 C) (Oral)   Resp 14   Ht 5\' 5"  (1.651 m)   Wt 142 lb (64.4 kg)   SpO2 100%   BMI 23.63 kg/m   Physical Exam  Constitutional: He appears well-developed and well-nourished.  HENT:  Head: Normocephalic and atraumatic.  Eyes:  Conjunctivae are normal.  Psychiatric: He has a normal mood and affect. His behavior is normal. Judgment and thought content normal.     Imaging:  Selected images from the following examinations were reviewed in detail: Abdominal MRI - 11/01/2017; 06/28/2017; CT abdomen and pelvis - 07/09/2017; bland hepatic embolization - 07/25/2017  [name] Contrast  Result Date: 11/01/2017 CLINICAL DATA:  Followup hepatocellular carcinoma status post bland embolization. EXAM: MRI ABDOMEN WITHOUT AND WITH CONTRAST TECHNIQUE: Multiplanar multisequence MR imaging of the abdomen was performed both before and after the administration  of intravenous contrast. CONTRAST:  27mL MULTIHANCE GADOBENATE DIMEGLUMINE 529 MG/ML IV SOLN Creatinine was obtained on site at [facility] at [address] Results: Creatinine 1.0 mg/dL. COMPARISON:  06/28/2017 FINDINGS: Lower chest: No acute findings. Hepatobiliary: The liver appears cirrhotic. The treated lesion within segment 4a is again noted. Centrally this is T1 hyperintense reflecting residual blood product. Peripheral areas of arterial phase enhancement associated with this lesion are again noted. Along the superior margin of this lesion there is progressive arterial phase enhancement measuring 1.4 x 1.3 cm, image 14/11. Previously this measured 1.0 by 0.6 cm. Along the lateral margin of this lesion there is arterial phase enhancement measuring 1.7 by 1.4 cm, image 18/11. Previously 1.9 x 1.1 cm. Along the posterior and inferior margin of this lesion there is subtle arterial phase enhancement measuring 1.6 by 1.3 cm, image 25/11. Previously 1.4 x 1.3 cm. The previously described focal area of arterial phase enhancement within the posterior aspect of segment 4a is unchanged measuring 1 cm, image 19/11. Previously described lesion within segment 6 measures 1.3 x 1.0 cm, image 31/11. Stable from previous exam. Several new areas of arterial phase enhancement are  identified within both lobes. The largest is in segment 5 measuring 1.6 cm, image 39/12. Within the posterior aspect of segment 8 there is a lesion measures 9 mm, image 16/11. In the left lobe, there are several tiny foci of arterial phase enhancement measuring up to 5 mm, including on image 23/11, image 29/11 and image 20/11. Pancreas: No mass, inflammatory changes, or other parenchymal abnormality identified. Spleen:  Within normal limits in size and appearance. Adrenals/Urinary Tract: No masses identified. No evidence of hydronephrosis. Stomach/Bowel: Visualized portions within the abdomen are unremarkable. Vascular/Lymphatic: No pathologically enlarged lymph nodes identified. No abdominal aortic aneurysm demonstrated. The portal vein and hepatic veins appear patent. Other:  No ascites. Musculoskeletal: No suspicious bone lesions identified. IMPRESSION: 1. Progressive peripheral nodular arterial phase enhancement adjacent to ablated lesion in segment 4A compatible with locally recurrent disease. 2. Increase in number of additional smaller arterial phase enhancing lesions compared with 06/28/2017, concerning for progressive multifocal disease. 3. Cirrhosis. Electronically Signed   By: Kerby Moors M.D.   On: 11/01/2017 14:58    Labs:  CBC: Recent Labs    07/25/17 1146  WBC 6.4  HGB 13.4  HCT 37.8*  PLT 78*    COAGS: Recent Labs    07/25/17 1146  INR 1.14    BMP: Recent Labs    12/16/16 1015 07/25/17 1146  NA 136 139  K 3.9 4.1  CL 99 103  CO2 29 27  GLUCOSE 77 83  BUN 11 12  CALCIUM 9.0 9.5  CREATININE 1.12 1.08  GFRNONAA  --  >60  GFRAA  --  >60    LIVER FUNCTION TESTS: Recent Labs    12/16/16 1015 07/25/17 1146  BILITOT 1.3* 1.5*  AST 35 50*  ALT 22 34  ALKPHOS 99 122  PROT 6.4 7.6  ALBUMIN 4.2 4.6    TUMOR MARKERS: Recent Labs    12/16/16 1015  AFPTM 17.0*    Assessment and Plan:  Mike Ferguson is a 77 y.o. male with past male history significant  for hypertension, esophageal varices and alcoholic cirrhosis (patient has not consumed alcohol since 2011), who has previously undergone hepatic microwave ablation on 06/25/2016 and most recently a bland hepatic embolization on 07/25/2017 and returns today following the acquisition of surveillance abdominal MRI performed 11/01/2017.    Patient  continues to admit to unchanged baseline level of mild fatigue.  Patient also reports decreased appetite with subjective weight loss of approximately 2 to 3 pounds.  Patient is otherwise without complaint.    Selected images from the following examinations were reviewed in detail: Abdominal MRI - 11/01/2017; 06/28/2017; CT abdomen and pelvis - 07/09/2017; bland hepatic embolization - 07/25/2017  Unfortunately, abdominal MRI performed 11/01/2017 and demonstrates no significant response to bland embolization performed 07/25/2017.    There is persistent nodular enhancement about the right hepatic lobe ablation site worrisome for residual/locally recurrent disease.  Additionally, there has been interval development of an approximately 0.9 cm nodule within the dome of the right lobe of the liver (image 16, series 11) as well as an ill-defined lesion involving the caudal aspect of the right lobe of the liver or is 37, series 11).    Increased conspicuity of punctate subcentimeter lesions within the medial aspect of the dome of the right lobe of the liver (images 19 and 20) as well as the caudal aspect of the right lobe of the liver (image 31, series 11).    While the majority of disease appears isolated to the right lobe of the liver, there is a small punctate enhancing nodule involving the posterior subcapsular aspects of the lateral segment of the left lobe of the liver (image 29, series 11) as well as ill-defined area of potential abnormal enhancement involving the subcapsular posterior medial aspect of the lateral segment of the left lobe of the liver (image 34, series  11).  Above findings were discussed in detail with the patient and the patient's wife.  I have advised the patient to maintain follow-up appointment with oncology at the [facility] scheduled for later this week for him to be evaluated for potential systemic therapy.  Patient had laboratories performed at the [facility] last week, however they are unavailable at the time of this appointment.    Given above and assuming the patient's bilirubin remains below 2, he would remain a candidate for potential Y 90 radioembolization.  As the patient and the patient's wife are interested in pursuing all available treatment options, prolonged conversations were held regarding benefits and risks (including but not limited to nontarget embolization, bleeding or vessel injury, worsening liver function) of Y 90 radioembolization.  I explained that similar to the bland embolization, Y 90 rembolization will be performed with palliative purposes with the hope of maintaining disease stability on the order of 6 to 9 months.  Following this prolonged and detailed conversation, the patient wishes to begin the insurance approval process for potential Y 90 radioembolization.  If patient is ultimately deemed a candidate for Y 90 radioembolization, all procedures were performed on outpatient basis at [facility].    Review of bland embolization arteriogram performed 07/25/2017 confirms an accessory right hepatic artery. I do not feel empiric embolization of the GDA is necessary given its remote location from the origin of either the right or left hepatic arteries as well as the potential hemodynamically significant narrowing of the SMA demonstrated on CTA performed 07/07/2017.  Note, there is a potential small pancreaticoduodenal artery which arises just distal to the takeoff of the left hepatic artery which may require embolization.  I do not definitively see the right gastric artery on the CTA performed  07/09/2017.  While the patient does have bilobar disease, he has very minimal disease in his left lobe with a predominant arterial supply via the dominant right and accessory right  hepatic arteries.  If his bilirubin level remains borderline, I would likely proceed with separate Y 90 radioembolization of the dominant right followed by the accessory right hepatic arteries in lieu of complete right lobar embolization to ensure preservation of hepatic function.  PLAN: - Follow-up with [facility] oncologist regarding candidacy for potential systemic treatment options - Acquire recently obtained laboratories from the [facility] to ensure patient's bilirubin is less than 2. - Begin insurance approval for Y 90 radial embolization.  Note, preprocedural imaging will not be necessary as patient underwent CTA on 07/07/2017.   Thank you for this interesting consult.  I greatly enjoyed meeting Mike Ferguson and look forward to participating in their care.  A copy of this report was sent to the requesting provider on this date.  Electronically Signed: Sandi Mariscal 11/17/2017, 10:12 AM   I spent a total of 25 Minutes in face to face in clinical consultation, greater than 50% of which was counseling/coordinating care for progressive [facility]

## 2017-11-23 ENCOUNTER — Other Ambulatory Visit (HOSPITAL_COMMUNITY): Payer: Self-pay | Admitting: Interventional Radiology

## 2017-11-23 DIAGNOSIS — C220 Liver cell carcinoma: Secondary | ICD-10-CM

## 2017-12-07 ENCOUNTER — Other Ambulatory Visit: Payer: Self-pay | Admitting: Radiology

## 2017-12-08 ENCOUNTER — Encounter (HOSPITAL_COMMUNITY)
Admission: RE | Admit: 2017-12-08 | Discharge: 2017-12-08 | Disposition: A | Discharge status: Home / Self Care | Payer: No Typology Code available for payment source | Source: Ambulatory Visit | Attending: Interventional Radiology | Admitting: Interventional Radiology

## 2017-12-08 ENCOUNTER — Other Ambulatory Visit: Payer: Self-pay

## 2017-12-08 ENCOUNTER — Ambulatory Visit (HOSPITAL_COMMUNITY)
Admission: RE | Admit: 2017-12-08 | Discharge: 2017-12-08 | Disposition: A | Discharge status: Home / Self Care | Payer: No Typology Code available for payment source | Source: Ambulatory Visit | Attending: Interventional Radiology | Admitting: Interventional Radiology

## 2017-12-08 ENCOUNTER — Other Ambulatory Visit (HOSPITAL_COMMUNITY): Payer: Self-pay | Admitting: Interventional Radiology

## 2017-12-08 ENCOUNTER — Inpatient Hospital Stay (HOSPITAL_COMMUNITY)
Admission: AD | Admit: 2017-12-08 | Discharge: 2017-12-10 | DRG: 988 | Disposition: A | Discharge status: Home / Self Care | Payer: No Typology Code available for payment source | Source: Ambulatory Visit | Attending: Internal Medicine | Admitting: Internal Medicine

## 2017-12-08 ENCOUNTER — Encounter (HOSPITAL_COMMUNITY): Payer: Self-pay

## 2017-12-08 ENCOUNTER — Other Ambulatory Visit (HOSPITAL_COMMUNITY): Payer: Non-veteran care

## 2017-12-08 DIAGNOSIS — M5136 Other intervertebral disc degeneration, lumbar region: Secondary | ICD-10-CM | POA: Diagnosis present

## 2017-12-08 DIAGNOSIS — I2510 Atherosclerotic heart disease of native coronary artery without angina pectoris: Secondary | ICD-10-CM | POA: Diagnosis present

## 2017-12-08 DIAGNOSIS — T148XXA Other injury of unspecified body region, initial encounter: Secondary | ICD-10-CM

## 2017-12-08 DIAGNOSIS — H9193 Unspecified hearing loss, bilateral: Secondary | ICD-10-CM | POA: Diagnosis present

## 2017-12-08 DIAGNOSIS — I700 Atherosclerosis of aorta: Secondary | ICD-10-CM | POA: Diagnosis present

## 2017-12-08 DIAGNOSIS — I8510 Secondary esophageal varices without bleeding: Secondary | ICD-10-CM | POA: Diagnosis present

## 2017-12-08 DIAGNOSIS — C220 Liver cell carcinoma: Secondary | ICD-10-CM

## 2017-12-08 DIAGNOSIS — H9190 Unspecified hearing loss, unspecified ear: Secondary | ICD-10-CM | POA: Diagnosis present

## 2017-12-08 DIAGNOSIS — R55 Syncope and collapse: Secondary | ICD-10-CM | POA: Diagnosis present

## 2017-12-08 DIAGNOSIS — R58 Hemorrhage, not elsewhere classified: Secondary | ICD-10-CM

## 2017-12-08 DIAGNOSIS — Z881 Allergy status to other antibiotic agents status: Secondary | ICD-10-CM

## 2017-12-08 DIAGNOSIS — K91870 Postprocedural hematoma of a digestive system organ or structure following a digestive system procedure: Secondary | ICD-10-CM | POA: Diagnosis present

## 2017-12-08 DIAGNOSIS — M1990 Unspecified osteoarthritis, unspecified site: Secondary | ICD-10-CM | POA: Diagnosis present

## 2017-12-08 DIAGNOSIS — Y838 Other surgical procedures as the cause of abnormal reaction of the patient, or of later complication, without mention of misadventure at the time of the procedure: Secondary | ICD-10-CM | POA: Diagnosis present

## 2017-12-08 DIAGNOSIS — K7030 Alcoholic cirrhosis of liver without ascites: Secondary | ICD-10-CM | POA: Diagnosis not present

## 2017-12-08 DIAGNOSIS — I10 Essential (primary) hypertension: Secondary | ICD-10-CM | POA: Diagnosis present

## 2017-12-08 DIAGNOSIS — D62 Acute posthemorrhagic anemia: Secondary | ICD-10-CM | POA: Diagnosis present

## 2017-12-08 DIAGNOSIS — I340 Nonrheumatic mitral (valve) insufficiency: Secondary | ICD-10-CM | POA: Diagnosis not present

## 2017-12-08 DIAGNOSIS — Z79899 Other long term (current) drug therapy: Secondary | ICD-10-CM

## 2017-12-08 DIAGNOSIS — D696 Thrombocytopenia, unspecified: Secondary | ICD-10-CM | POA: Diagnosis present

## 2017-12-08 DIAGNOSIS — Z87891 Personal history of nicotine dependence: Secondary | ICD-10-CM

## 2017-12-08 HISTORY — PX: IR EMBO ARTERIAL NOT HEMORR HEMANG INC GUIDE ROADMAPPING: IMG5448

## 2017-12-08 HISTORY — PX: IR ANGIOGRAM VISCERAL SELECTIVE: IMG657

## 2017-12-08 HISTORY — PX: IR FLUORO RM 30-60 MIN: IMG2384

## 2017-12-08 HISTORY — PX: IR ANGIOGRAM SELECTIVE EACH ADDITIONAL VESSEL: IMG667

## 2017-12-08 HISTORY — PX: IR US GUIDE VASC ACCESS RIGHT: IMG2390

## 2017-12-08 LAB — CBC WITH DIFFERENTIAL/PLATELET
Basophils Absolute: 0 10*3/uL (ref 0.0–0.1)
Basophils Relative: 0 %
Eosinophils Absolute: 0.7 10*3/uL (ref 0.0–0.7)
Eosinophils Relative: 11 %
HCT: 41.1 % (ref 39.0–52.0)
Hemoglobin: 14.4 g/dL (ref 13.0–17.0)
LYMPHS ABS: 1.7 10*3/uL (ref 0.7–4.0)
LYMPHS PCT: 27 %
MCH: 33.7 pg (ref 26.0–34.0)
MCHC: 35 g/dL (ref 30.0–36.0)
MCV: 96.3 fL (ref 78.0–100.0)
MONO ABS: 1.6 10*3/uL — AB (ref 0.1–1.0)
Monocytes Relative: 25 %
Neutro Abs: 2.3 10*3/uL (ref 1.7–7.7)
Neutrophils Relative %: 37 %
PLATELETS: 83 10*3/uL — AB (ref 150–400)
RBC: 4.27 MIL/uL (ref 4.22–5.81)
RDW: 13.9 % (ref 11.5–15.5)
WBC: 6.5 10*3/uL (ref 4.0–10.5)

## 2017-12-08 LAB — CBC
HCT: 35.4 % — ABNORMAL LOW (ref 39.0–52.0)
Hemoglobin: 12.2 g/dL — ABNORMAL LOW (ref 13.0–17.0)
MCH: 33.2 pg (ref 26.0–34.0)
MCHC: 34.5 g/dL (ref 30.0–36.0)
MCV: 96.2 fL (ref 78.0–100.0)
PLATELETS: 74 10*3/uL — AB (ref 150–400)
RBC: 3.68 MIL/uL — AB (ref 4.22–5.81)
RDW: 13.9 % (ref 11.5–15.5)
WBC: 8.7 10*3/uL (ref 4.0–10.5)

## 2017-12-08 LAB — COMPREHENSIVE METABOLIC PANEL
ALT: 45 U/L (ref 17–63)
ANION GAP: 13 (ref 5–15)
AST: 75 U/L — ABNORMAL HIGH (ref 15–41)
Albumin: 5.1 g/dL — ABNORMAL HIGH (ref 3.5–5.0)
Alkaline Phosphatase: 124 U/L (ref 38–126)
BUN: 11 mg/dL (ref 6–20)
CALCIUM: 9.5 mg/dL (ref 8.9–10.3)
CHLORIDE: 100 mmol/L — AB (ref 101–111)
CO2: 25 mmol/L (ref 22–32)
Creatinine, Ser: 1.07 mg/dL (ref 0.61–1.24)
GFR calc non Af Amer: >60 mL/min (ref >60)
Glucose, Bld: 83 mg/dL (ref 65–99)
POTASSIUM: 5.1 mmol/L (ref 3.5–5.1)
Sodium: 138 mmol/L (ref 135–145)
TOTAL PROTEIN: 8.5 g/dL — AB (ref 6.5–8.1)
Total Bilirubin: 1.6 mg/dL — ABNORMAL HIGH (ref 0.3–1.2)

## 2017-12-08 LAB — HEMOGLOBIN AND HEMATOCRIT, BLOOD
HCT: 31.9 % — ABNORMAL LOW (ref 39.0–52.0)
HEMOGLOBIN: 10.8 g/dL — AB (ref 13.0–17.0)

## 2017-12-08 LAB — PROTIME-INR
INR: 1.12
PROTHROMBIN TIME: 14.3 s (ref 11.4–15.2)

## 2017-12-08 MED ORDER — IOPAMIDOL (ISOVUE-300) INJECTION 61%
100.0000 mL | Freq: Once | INTRAVENOUS | Status: AC | PRN
  Administered 2017-12-08: 16 mL via INTRA_ARTERIAL

## 2017-12-08 MED ORDER — IOPAMIDOL (ISOVUE-300) INJECTION 61%
INTRAVENOUS | Status: AC
  Administered 2017-12-08: 55 mL via INTRA_ARTERIAL
  Filled 2017-12-08: qty 200

## 2017-12-08 MED ORDER — LORATADINE-PSEUDOEPHEDRINE ER 10-240 MG PO TB24: 1.0000 | ORAL_TABLET | Freq: Every day | ORAL | Status: DC

## 2017-12-08 MED ORDER — FENTANYL CITRATE (PF) 100 MCG/2ML IJ SOLN
INTRAMUSCULAR | Status: AC
  Filled 2017-12-08: qty 4

## 2017-12-08 MED ORDER — MIDAZOLAM HCL 2 MG/2ML IJ SOLN
INTRAMUSCULAR | Status: AC
  Filled 2017-12-08: qty 4

## 2017-12-08 MED ORDER — IOPAMIDOL (ISOVUE-370) INJECTION 76%
INTRAVENOUS | Status: AC
  Filled 2017-12-08: qty 100

## 2017-12-08 MED ORDER — LIDOCAINE HCL 1 % IJ SOLN
INTRAMUSCULAR | Status: AC | PRN
  Administered 2017-12-08: 5 mL

## 2017-12-08 MED ORDER — IOPAMIDOL (ISOVUE-300) INJECTION 61%
100.0000 mL | Freq: Once | INTRAVENOUS | Status: AC | PRN
  Administered 2017-12-08: 12 mL via INTRA_ARTERIAL

## 2017-12-08 MED ORDER — TAMSULOSIN HCL 0.4 MG PO CAPS
0.4000 mg | ORAL_CAPSULE | Freq: Every day | ORAL | Status: DC
  Administered 2017-12-08: 0.4 mg via ORAL
  Filled 2017-12-08: qty 1

## 2017-12-08 MED ORDER — FENTANYL CITRATE (PF) 100 MCG/2ML IJ SOLN
INTRAMUSCULAR | Status: AC
  Filled 2017-12-08: qty 2

## 2017-12-08 MED ORDER — RIFAXIMIN 550 MG PO TABS
550.0000 mg | ORAL_TABLET | Freq: Two times a day (BID) | ORAL | Status: DC
  Administered 2017-12-08 – 2017-12-10 (×4): 550 mg via ORAL
  Filled 2017-12-08 (×5): qty 1

## 2017-12-08 MED ORDER — VITAMIN C 500 MG PO TABS
500.0000 mg | ORAL_TABLET | Freq: Every day | ORAL | Status: DC
  Administered 2017-12-09 – 2017-12-10 (×2): 500 mg via ORAL
  Filled 2017-12-08 (×2): qty 1

## 2017-12-08 MED ORDER — TRAMADOL HCL 50 MG PO TABS
50.0000 mg | ORAL_TABLET | Freq: Four times a day (QID) | ORAL | Status: DC | PRN
  Administered 2017-12-08 – 2017-12-09 (×2): 50 mg via ORAL
  Filled 2017-12-08 (×2): qty 1

## 2017-12-08 MED ORDER — VITAMIN D3 25 MCG (1000 UNIT) PO TABS
2000.0000 [IU] | ORAL_TABLET | Freq: Every day | ORAL | Status: DC
  Administered 2017-12-09 – 2017-12-10 (×2): 2000 [IU] via ORAL
  Filled 2017-12-08 (×2): qty 2

## 2017-12-08 MED ORDER — PROPRANOLOL HCL 20 MG PO TABS
20.0000 mg | ORAL_TABLET | Freq: Two times a day (BID) | ORAL | Status: DC
  Administered 2017-12-09 – 2017-12-10 (×2): 20 mg via ORAL
  Filled 2017-12-08 (×2): qty 1

## 2017-12-08 MED ORDER — LIDOCAINE HCL 1 % IJ SOLN
INTRAMUSCULAR | Status: AC
  Filled 2017-12-08: qty 20

## 2017-12-08 MED ORDER — VITAMIN B-12 100 MCG PO TABS
100.0000 ug | ORAL_TABLET | Freq: Every day | ORAL | Status: DC
  Administered 2017-12-09 – 2017-12-10 (×2): 100 ug via ORAL
  Filled 2017-12-08 (×2): qty 1

## 2017-12-08 MED ORDER — HYDROMORPHONE HCL 1 MG/ML IJ SOLN
0.5000 mg | INTRAMUSCULAR | Status: DC | PRN
  Administered 2017-12-08: 0.5 mg via INTRAVENOUS
  Filled 2017-12-08: qty 1

## 2017-12-08 MED ORDER — LIDOCAINE-EPINEPHRINE 2 %-1:200000 IJ SOLN
INTRAMUSCULAR | Status: AC
Start: 2017-12-08 — End: 2017-12-09
  Filled 2017-12-08: qty 20

## 2017-12-08 MED ORDER — MIRTAZAPINE 15 MG PO TABS
15.0000 mg | ORAL_TABLET | Freq: Every day | ORAL | Status: DC
  Administered 2017-12-08 – 2017-12-09 (×2): 15 mg via ORAL
  Filled 2017-12-08 (×2): qty 1

## 2017-12-08 MED ORDER — MIDAZOLAM HCL 2 MG/2ML IJ SOLN
INTRAMUSCULAR | Status: AC | PRN
  Administered 2017-12-08 (×3): 1 mg via INTRAVENOUS

## 2017-12-08 MED ORDER — IOPAMIDOL (ISOVUE-300) INJECTION 61%
100.0000 mL | Freq: Once | INTRAVENOUS | Status: AC | PRN
  Administered 2017-12-08: 55 mL via INTRA_ARTERIAL

## 2017-12-08 MED ORDER — IOPAMIDOL (ISOVUE-370) INJECTION 76%
75.0000 mL | Freq: Once | INTRAVENOUS | Status: AC | PRN
  Administered 2017-12-08: 75 mL via INTRAVENOUS

## 2017-12-08 MED ORDER — IOPAMIDOL (ISOVUE-300) INJECTION 61%
INTRAVENOUS | Status: AC
  Administered 2017-12-08: 12 mL via INTRA_ARTERIAL
  Filled 2017-12-08: qty 100

## 2017-12-08 MED ORDER — TECHNETIUM TO 99M ALBUMIN AGGREGATED
4.5000 | Freq: Once | INTRAVENOUS | Status: AC | PRN
  Administered 2017-12-08: 4.5 via INTRAVENOUS

## 2017-12-08 MED ORDER — SALINE SPRAY 0.65 % NA SOLN: 1.0000 | NASAL | Status: DC | PRN

## 2017-12-08 MED ORDER — FENTANYL CITRATE (PF) 100 MCG/2ML IJ SOLN
INTRAMUSCULAR | Status: AC | PRN
  Administered 2017-12-08: 50 ug via INTRAVENOUS

## 2017-12-08 MED ORDER — SODIUM CHLORIDE 0.9 % IV SOLN
INTRAVENOUS | Status: DC
  Administered 2017-12-08 – 2017-12-10 (×2): via INTRAVENOUS

## 2017-12-08 MED ORDER — FENTANYL CITRATE (PF) 100 MCG/2ML IJ SOLN
INTRAMUSCULAR | Status: AC | PRN
  Administered 2017-12-08 (×3): 50 ug via INTRAVENOUS

## 2017-12-08 NOTE — Progress Notes (Signed)
Interventional radiology requested admission to inpatient [facility] hospitalist [facility], 77 year old male with a history of esophageal varices, alcoholic cirrhosis, hepatocellular carcinoma, scheduled to undergo hepatic mapping procedure with potential embolization . During this procedure, there was a concern that the patient developed internal bleeding. Is in the process of getting a CT angiogram of the abdomen. Dr. Hulen Skains did decided the patient would need embolization following review of the CT. Requested IR to call for admission, once the patient's procedures are completed. Please call [phone]

## 2017-12-08 NOTE — Significant Event (Signed)
Rapid Response Event Note  Overview: Time Called: 2100 Arrival Time: 2105 Event Type: Hypotension  Initial Focused Assessment: 77 yo male pt lying on the bed.  Staff reports he was attempting to have a bowel movement and became unresponsive noting that his HR was "low".  Pt clammy, alert but not speaking, VS as listed in flowsheet.  Blood sugar obtained WNL, O2 applied, cold rag applied, pt began to speak.  At end of RR visit, pt alert and oriented and apologizing.  VS WNL, pt without further complaints. Discussed for pt to avoid bearing down as this was the second episode for today.     Interventions: above  Plan of Care (if not transferred): supportive  Event Summary:   at      at          [facility]

## 2017-12-08 NOTE — Progress Notes (Signed)
PA paged to bedside for patient with severe acute pain.  Patient had recovered well s/p Y90 roadmapping procedure today.  He had been able to tolerate a few sips of beverage and was attempting to urinate when felt sudden sharp pain in his right lower abdomen.  He successfully urinated clear, yellow urine, however continued to have severe abdominal pain.  PA to bedside with Dr. Pascal Lux.   Abdomen:  RUQ, LUQ, LLQ soft and non-tender to palpation.  RLQ exquisitely tender to touch with areas of palpable swelling. Groin soft.  Puncture site intact without area of erythema, oozing, or swelling.  Distal pulses intact, 2+ VSS stable.  HR maintaining per his usual in the 50-60s.  BP 105/64.   Dr. Pascal Lux planning to obtain additional imaging due to concern for acute bleeding vs. Hematoma.   Ordered IV pain medication for patient.   Will call [facility] for assistance with admission for observation overnight.  Patient with past medical history of cirrhosis, [place].  Will continue to work towards identifying source of pain and control of bleeding/hematoma as determined by IR MD and imaging.   Brynda Greathouse, MS RD PA-C 3:40 PM

## 2017-12-08 NOTE — H&P (Signed)
Chief Complaint: Patient was seen in consultation today for [facility]  Referring Physician(s): Watts,John  Supervising Physician: Sandi Mariscal  Patient Status: [facility] - Out-pt  History of Present Illness: Mike Ferguson is a 77 y.o. male  with past male history significant for hypertension, esophageal varices and alcoholic cirrhosis (has not consumed alcohol since 2011), who has previously undergone hepatic microwave ablation on 06/25/2016 and bland hepatic embolization on 07/25/2017. He recently met with Dr. Pascal Lux in [place] clinic for follow-up where new imaging showed no significant response to bland embolization.  After extensive discussion, patient and family elected to proceed with Y90 radioembolization for continued palliation of his Lorenzo.  He presents for pre-Y90 roadmapping procedure today.  He is in his usual state of health; denies new complaints.  Appears fatigued which is a chronic complaint for him.  He has been NPO.  He does not take blood thinners.     Past Medical History:  Diagnosis Date  . Arthritis    generalized-back  . Cirrhosis ([place])   . Cirrhosis, alcoholic ([place])   . Esophageal varices ([place])   . Hearing impaired person, bilateral    hearing aids bilaterally  . Hepatocellular carcinoma ([place])   . Hypertension     Past Surgical History:  Procedure Laterality Date  . HERNIA REPAIR     RIH  . IR ANGIOGRAM SELECTIVE EACH ADDITIONAL VESSEL  07/25/2017  . IR ANGIOGRAM SELECTIVE EACH ADDITIONAL VESSEL  07/25/2017  . IR ANGIOGRAM SELECTIVE EACH ADDITIONAL VESSEL  07/25/2017  . IR ANGIOGRAM SELECTIVE EACH ADDITIONAL VESSEL  07/25/2017  . IR ANGIOGRAM VISCERAL SELECTIVE  07/25/2017  . IR ANGIOGRAM VISCERAL SELECTIVE  07/25/2017  . IR EMBO TUMOR ORGAN ISCHEMIA INFARCT INC GUIDE ROADMAPPING  07/25/2017  . IR GENERIC HISTORICAL  06/17/2016   IR RADIOLOGIST EVAL & MGMT 06/17/2016 Sandi Mariscal, MD [facility] INTERV RAD  . IR GENERIC HISTORICAL  07/21/2016   IR RADIOLOGIST EVAL & MGMT 07/21/2016 Sandi Mariscal, MD [facility] INTERV RAD  . IR RADIOLOGIST EVAL & MGMT  10/26/2016  . IR RADIOLOGIST EVAL & MGMT  04/05/2017  . IR RADIOLOGIST EVAL & MGMT  01/11/2017  . IR RADIOLOGIST EVAL & MGMT  06/28/2017  . IR RADIOLOGIST EVAL & MGMT  08/18/2017  . IR RADIOLOGIST EVAL & MGMT  11/17/2017  . IR US GUIDE VASC ACCESS RIGHT  07/25/2017  . Left rotator cuff      Allergies: Erythromycin  Medications: Prior to Admission medications   Medication Sig Start Date End Date Taking? Authorizing Provider  propranolol (INDERAL) 20 MG tablet Take 20 mg by mouth 2 (two) times daily. 05/31/16  Yes [provider]  rifaximin (XIFAXAN) 550 MG TABS tablet Take 550 mg by mouth 2 (two) times daily.   Yes [provider]  sodium chloride (OCEAN) 0.65 % SOLN nasal spray Place 1 spray into both nostrils as needed for congestion.   Yes [provider]  traMADol (ULTRAM) 50 MG tablet Take 50 mg by mouth every 6 (six) hours as needed for moderate pain or severe pain.   Yes [provider]  vitamin B-12 (CYANOCOBALAMIN) 100 MCG tablet Take 100 mcg by mouth daily.   Yes [provider]  vitamin C (ASCORBIC ACID) 500 MG tablet Take 500 mg by mouth daily.   Yes [provider]  cholecalciferol (VITAMIN D) 1000 units tablet Take 2,000 Units by mouth daily.    [provider]  loratadine-pseudoephedrine (CLARITIN-D 24-HOUR) 10-240 MG 24 hr tablet Take  1 tablet by mouth daily as needed for allergies.     [provider]  mirtazapine (REMERON) 15 MG tablet Take 15 mg by mouth at bedtime.    [provider]  tamsulosin (FLOMAX) 0.4 MG CAPS capsule TAKE 1 capsule Once in the evening Orally 30 day(s) 05/20/17   [provider]     History reviewed. No pertinent family history.  Social History   Socioeconomic History  . Marital status: Married    Spouse name: Not on file  . Number of children: Not on file  . Years of education: Not on file  .  Highest education level: Not on file  Occupational History  . Not on file  Social Needs  . Financial resource strain: Not on file  . Food insecurity:    Worry: Not on file    Inability: Not on file  . Transportation needs:    Medical: Not on file    Non-medical: Not on file  Tobacco Use  . Smoking status: Former Smoker    Last attempt to quit: 06/24/1986    Years since quitting: 31.4  . Smokeless tobacco: Never Used  Substance and Sexual Activity  . Alcohol use: No    Comment: 2011 Quit- ETOH abuse  . Drug use: No  . Sexual activity: Not Currently  Lifestyle  . Physical activity:    Days per week: Not on file    Minutes per session: Not on file  . Stress: Not on file  Relationships  . Social connections:    Talks on phone: Not on file    Gets together: Not on file    Attends religious service: Not on file    Active member of club or organization: Not on file    Attends meetings of clubs or organizations: Not on file    Relationship status: Not on file  Other Topics Concern  . Not on file  Social History Narrative  . Not on file     Review of Systems: A 12 point ROS discussed and pertinent positives are indicated in the HPI above.  All other systems are negative.  Review of Systems  Constitutional: Positive for fatigue. Negative for fever.  Respiratory: Negative for cough and shortness of breath.   Cardiovascular: Negative for chest pain.  Gastrointestinal: Negative for abdominal pain, nausea and vomiting.  Psychiatric/Behavioral: Negative for behavioral problems and confusion.    Vital Signs: There were no vitals taken for this visit.  Physical Exam  Constitutional: He is oriented to person, place, and time. He appears well-developed.  Severe muscle and subcutaneous fat wasting  Cardiovascular: Regular rhythm and normal heart sounds. Exam reveals no gallop and no friction rub.  No murmur heard. bradycardia  Pulmonary/Chest: Effort normal and breath sounds  normal. No respiratory distress.  Abdominal: Soft. He exhibits no distension. There is no tenderness.  Neurological: He is alert and oriented to person, place, and time.  Skin: Skin is warm and dry.  Psychiatric: He has a normal mood and affect. His behavior is normal. Judgment and thought content normal.  Nursing note and vitals reviewed.    MD Evaluation Airway: WNL Heart: WNL Abdomen: WNL Chest/ Lungs: WNL ASA  Classification: 3 Mallampati/Airway Score: One   Imaging: Ir Radiologist Eval & Mgmt  Result Date: 11/17/2017 Please refer to notes tab for details about interventional procedure. (Op Note)   Labs:  CBC: Recent Labs    07/25/17 1146 12/08/17 0803  WBC 6.4 6.5  HGB 13.4  14.4  HCT 37.8* 41.1  PLT 78* 83*    COAGS: Recent Labs    07/25/17 1146 12/08/17 0803  INR 1.14 1.12    BMP: Recent Labs    12/16/16 1015 07/25/17 1146 12/08/17 0803  NA 136 139 138  K 3.9 4.1 5.1  CL 99 103 100*  CO2 _0 GLUCOSE 77 83 83  BUN _1 CALCIUM 9.0 9.5 9.5  CREATININE 1.12 1.08 1.07  GFRNONAA  --  >60 >60  GFRAA  --  >60 >60    LIVER FUNCTION TESTS: Recent Labs    12/16/16 1015 07/25/17 1146 12/08/17 0803  BILITOT 1.3* 1.5* 1.6*  AST 35 50* 75*  ALT 22 34 45  ALKPHOS 99 122 124  PROT 6.4 7.6 8.5*  ALBUMIN 4.2 4.6 5.1*    TUMOR MARKERS: Recent Labs    12/16/16 1015  AFPTM 17.0*    Assessment and Plan: Patient with past medical history of hepatocellcular carcinoma presents with complaint of no change in disease course after recent bland embolization procedure.  Patient and wife met with Dr. Pascal Lux in IR clinic in April 2019 to discuss alternate treatment strategies.  After discussion, patient has elected to proceed with radioembolization and presents for pre-Y90 roadmapping today.  His bilirubin in 1.6 today; an increase from 1.2 over the past few months is noted.  Patient presents today without new complaints. He continues with  chronic fatigue.  He has been NPO and is not currently on blood thinners.   Risks and benefits discussed with the patient including, but not limited to bleeding, infection, vascular injury, post procedural pain, nausea, vomiting and fatigue, contrast induced renal failure, liver failure, radiation injury to the bowel, radiation induced cholecystitis, neutropenia and possible need for additional procedures.  All of the patient's questions were answered, patient is agreeable to proceed. Consent signed and in chart.  Thank you for this interesting consult.  I greatly enjoyed meeting Mike Ferguson and look forward to participating in their care.  A copy of this report was sent to the requesting provider on this date.  Electronically Signed: Docia Barrier, PA 12/08/2017, 9:25 AM   I spent a total of    15 Minutes in face to face in clinical consultation, greater than 50% of which was counseling/coordinating care for hepatocellular carcinoma.

## 2017-12-08 NOTE — Sedation Documentation (Signed)
Groin site soft. No s/s of bleeding/hematoma. In NM at this time

## 2017-12-08 NOTE — Discharge Instructions (Signed)
Moderate Conscious Sedation, Adult, Care After These instructions provide you with information about caring for yourself after your procedure. Your health care provider may also give you more specific instructions. Your treatment has been planned according to current medical practices, but problems sometimes occur. Call your health care provider if you have any problems or questions after your procedure. What can I expect after the procedure? After your procedure, it is common:  To feel sleepy for several hours.  To feel clumsy and have poor balance for several hours.  To have poor judgment for several hours.  To vomit if you eat too soon.  Follow these instructions at home: For at least 24 hours after the procedure:   Do not: ? Participate in activities where you could fall or become injured. ? Drive. ? Use heavy machinery. ? Drink alcohol. ? Take sleeping pills or medicines that cause drowsiness. ? Make important decisions or sign legal documents. ? Take care of children on your own.  Rest. Eating and drinking  Follow the diet recommended by your health care provider.  If you vomit: ? Drink water, juice, or soup when you can drink without vomiting. ? Make sure you have little or no nausea before eating solid foods. General instructions  Have a responsible adult stay with you until you are awake and alert.  Take over-the-counter and prescription medicines only as told by your health care provider.  If you smoke, do not smoke without supervision.  Keep all follow-up visits as told by your health care provider. This is important. Contact a health care provider if:  You keep feeling nauseous or you keep vomiting.  You feel light-headed.  You develop a rash.  You have a fever. Get help right away if:  You have trouble breathing. This information is not intended to replace advice given to you by your health care provider. Make sure you discuss any questions you have  with your health care provider. Document Released: 04/25/2013 Document Revised: 12/08/2015 Document Reviewed: 10/25/2015 Elsevier Interactive Patient Education  2018 [employer].   Hepatic Artery Radioembolization Hepatic artery radioembolization is a combination of radiation therapy and a procedure to block blood supply to a tumor in the liver (embolization). It delivers tiny glass or resin beads (microspheres) into the blood vessel (hepatic artery) that supplies blood to the tumor. The microspheres lodge at the tumor site and deliver a high dose of radiation to the tumor but not to healthy tissue. The goal of this procedure is to slow or stop the growth of a cancerous tumor. You may need radioembolization if you have liver cancer or another type of cancer that has spread to the liver. After this procedure, the microspheres will release radiation into the tumor for about 2 weeks. Radiation will be completely gone after 30 days. If your tumor continues to grow or it returns after treatment, the procedure can be repeated. Tell a health care provider about:  Any allergies you have.  All medicines you are taking, including vitamins, herbs, eye drops, creams, and over-the-counter medicines.  Any problems you or family members have had with anesthetic medicines.  Any blood disorders you have.  Any surgeries you have had.  Any medical conditions you have.  Whether you are pregnant or may be pregnant. What are the risks? Generally, this is a safe procedure. However, problems may occur, including:  Infection.  Bleeding.  Allergic reactions to medicines or dyes.  Blood clots.  Stomach ulcer (if some microspheres travel to  the stomach).  There is also a risk of damage to other structures or organs, such as:  The affected blood vessel.  Healthy cells that are close to the radioembolization site.  The liver.  What happens before the procedure? Medicines Ask your health care  provider about:  Changing or stopping your regular medicines. This is especially important if you are taking diabetes medicines or blood thinners.  Taking medicines such as aspirin and ibuprofen. These medicines can thin your blood. Do not take these medicines before your procedure if your health care provider instructs you not to.  Staying hydrated Follow instructions from your health care provider about hydration, which may include:  Up to 2 hours before the procedure - you may continue to drink clear liquids, such as water, clear fruit juice, black coffee, and plain tea.  Eating and drinking restrictions Follow instructions from your health care provider about eating and drinking, which may include:  8 hours before the procedure - stop eating heavy meals or foods such as meat, fried foods, or fatty foods.  6 hours before the procedure - stop eating light meals or foods, such as toast or cereal.  6 hours before the procedure - stop drinking milk or drinks that contain milk.  2 hours before the procedure - stop drinking clear liquids.  General instructions  You may have blood tests.  You may have a urine sample taken.  You will have an X-ray of the blood vessels in your liver (angiogram).  Ask your health care provider how your surgical site will be marked or identified.  To reduce your risk of infection: ? You may be given antibiotic medicine to help prevent infection. ? You may be asked to shower with a germ-killing soap.  Plan to have someone take you home from the hospital or clinic.  If you will be going home right after the procedure, plan to have someone with you for 24 hours. What happens during the procedure?  To reduce your risk of infection: ? Your health care team will wash or sanitize their hands. ? Your skin will be washed with soap.  An IV tube will be inserted into one of your veins.  You will be given one or more of the following: ? A medicine to help  you relax (sedative). ? A medicine to numb your groin area (local anesthetic). ? A medicine to make you fall asleep (general anesthetic).  A needle will be inserted into a large blood vessel in your groin (femoral artery).  A thin tube (catheter) will be passed through the needle and into your femoral artery. The catheter will be guided to the hepatic artery.  Dye will be injected through the IV tube, and X-rays will be taken (fluoroscopy). This helps to show the exact location of the blood vessels that supply your tumor. Your surgeon will use the X-rays as a guide during the procedure.  Microspheres will be injected through the catheter into the parts of the hepatic artery that supply the tumor.  More X-rays will be taken to make sure the blood supply to the tumor has been blocked as planned.  The catheter will be removed, and pressure will be applied to the area to stop any bleeding.  A bandage (dressing) will be applied. The procedure may vary among health care providers and hospitals. What happens after the procedure?  Your blood pressure, heart rate, breathing rate, and blood oxygen level will be monitored until the medicines you were given  have worn off.  You may have to wear compression stockings. These stockings help to prevent blood clots and reduce swelling in your legs.  You may continue to receive fluids and medicines through an IV tube.  You may have pain, nausea, vomiting, or a fever. These symptoms are called post-embolization syndrome. You may be given medicine to help relieve these symptoms.  You will need to stay lying down for 6-8 hours.  You may need to avoid contact with people during your hospital stay. You may also need to avoid contact for a certain amount of time after you leave the hospital. Ask your health care provider if this applies to you.  Do not drive for 24 hours if you were given a sedative. This information is not intended to replace advice given  to you by your health care provider. Make sure you discuss any questions you have with your health care provider. Document Released: 07/10/2013 Document Revised: 04/03/2016 Document Reviewed: 04/03/2016 Elsevier Interactive Patient Education  2018 [place] Radioembolization Discharge Instructions  You have been given a radioactive material during your procedure.  While it is safe for you to be discharged home from the hospital, you need to proceed directly home.    Do not use public transportation, including air travel, lasting more than 2 hours for 1 week.  Avoid crowded public places for 1 week.  Adult visitors should try to avoid close contact with you for 1 week.    Children and pregnant females should not visit or have close contact with you for 1 week.  Items that you touch are not radioactive.  Do not sleep in the same bed as your partner for 1 week, and a condom should be used for sexual activity during the first 24 hours.  Your blood may be radioactive and caution should be used if any bleeding occurs during the recovery period.  Body fluids may be radioactive for 24 hours.  Wash your hands after voiding.  Men should sit to urinate.  Dispose of any soiled materials (flush down toilet or place in trash at home) during the first day.  Drink 6 to 8 glasses of fluids per day for 5 days to hydrate yourself.  If you need to see a doctor during the first week, you must let them know that you were treated with yttrium-90 microspheres, and will be slightly radioactive.  They can call Interventional Radiology [phone] with any questions.     Hepatic Artery Radioembolization, Care After This sheet gives you information about how to care for yourself after your procedure. Your health care provider may also give you more specific instructions. If you have problems or questions, contact your health care provider. What can I expect after the  procedure? After the procedure, it is common to have:  A slight fever for 1-2 weeks. If your fever gets worse, tell your health care provider.  Fatigue.  Loss of appetite. This should gradually improve after about 1 week.  Abdominal pain on your right side.  Soreness and tenderness in your groin area where the needle and catheter were placed (puncture site).  Follow these instructions at home: Puncture site care  Follow instructions from your health care provider about how to take care of the puncture site. Make sure you: ? Wash your hands with soap and water before you change your bandage (dressing). If soap and water are not available, use  hand sanitizer. ? Change your dressing as told by your health care provider. ? Leave stitches (sutures), skin glue, or adhesive strips in place. These skin closures may need to stay in place for 2 weeks or longer. If adhesive strip edges start to loosen and curl up, you may trim the loose edges. Do not remove adhesive strips completely unless your health care provider tells you to do that.  Check your puncture site every day for signs of infection. Check for: ? More redness, swelling, or pain. ? More fluid or blood. ? Warmth. ? Pus or a bad smell. Activity  Rest and return to your normal activities as told by your health care provider. Ask your health care provider what activities are safe for you.  Do not drive for 24 hours after the procedure if you were given a medicine to help you relax (sedative).  Do not lift anything that is heavier than 10 lb (4.5 kg) until your health care provider says that it is safe. Medicines  Take over-the-counter and prescription medicines only as told by your health care provider.  Do not drive or use heavy machinery while taking prescription pain medicine. Radiation precautions  For up to a week after your procedure, there will be a small amount of radioactivity near your liver. This is not especially  dangerous to other people. However, you should follow these precautions for 7 days: ? Do not come in close contact with people. ? Do not sleep in the same bed as someone else. ? Do not hold children or babies. ? Do not have contact with pregnant women. General instructions   To prevent or treat constipation while you are taking prescription pain medicine, your health care provider may recommend that you: ? Drink enough fluid to keep your urine clear or pale yellow. ? Take over-the-counter or prescription medicines. ? Eat foods that are high in fiber, such as fresh fruits and vegetables, whole grains, and beans. ? Limit foods that are high in fat and processed sugars, such as fried and sweet foods.  Eat frequent small meals until your appetite returns. Follow instructions from your health care provider about eating or drinking restrictions.  Do not take baths, swim, or use a hot tub until your health care provider approves. You may take showers. Wash your puncture site with mild soap and water and pat the area dry.  Wear compression stockings as told by your health care provider. These stockings help to prevent blood clots and reduce swelling in your legs.  Keep all follow-up visits as told by your health care provider. This is important. You may need to have blood tests and imaging tests done. Contact a health care provider if:  You have more redness, swelling, or pain around your puncture site.  You have more fluid or blood coming from your puncture site.  Your puncture site feels warm to the touch.  You have pus or a bad smell coming from your puncture site.  You have pain that: ? Gets worse. ? Does not get better with medicine. ? Feels like very bad heartburn. ? Is in the middle of your abdomen, above your belly button.  Your skin and the white parts of your eyes turn yellow (jaundice).  The color of your urine changes to dark brown.  The color of your stool changes to  light yellow.  Your abdominal measurement (girth) increases in a short period of time.  You gain more than 5 lb (2.3 kg) in  a short period of time. Get help right away if:  You have a fever that lasts longer than 2 weeks or is higher than what your health care provider told you to expect.  You develop any of the following in your legs: ? Pain. ? Swelling. ? Skin that is cold or pale or turns blue.  You have chest pain.  You have blood in your vomit, saliva, or stool.  You have trouble breathing. This information is not intended to replace advice given to you by your health care provider. Make sure you discuss any questions you have with your health care provider. Document Released: 07/10/2013 Document Revised: 04/03/2016 Document Reviewed: 04/03/2016 Elsevier Interactive Patient Education  [employer].

## 2017-12-08 NOTE — Sedation Documentation (Signed)
Groin site soft. No s/s of bleeding/hematoma. Pulses unchanged.

## 2017-12-08 NOTE — Progress Notes (Signed)
Patient transported to CT 

## 2017-12-08 NOTE — Progress Notes (Signed)
RN paged PA to bedside as patient complained of 10/10 right lower abdominal pain.

## 2017-12-08 NOTE — Sedation Documentation (Signed)
Patient is resting comfortably with eyes closed in NAD. 

## 2017-12-08 NOTE — H&P (Addendum)
History and Physical    REFAEL FULOP ENI:[id number] DOB: [birth year] DOA: 12/08/2017  PCP: Lawerance Cruel, MD   Patient coming from: IR Suite  Chief Complaint: RLQ Pain; Hematoma  HPI: Mike Ferguson is a 77 y.o. male with medical history significant for alcoholic liver cirrhosis with esophageal varices and complicated by hepatocellular carcinoma, as well as history of hypertension, history of arthritis and history of bilateral hearing deficits and hearing impairment who presented to the [facility] suite for a Y-90 with coil embolization of the pancreaticoduodenal artery procedure electively for his hepatocellular carcinoma.  Patient underwent the procedure today and post procedurally was doing well till patient tried to micturate and developed a sharp sudden stabbing pain in his right lower abdomen which was persistent.  Patient denied any nausea, vomiting, shortness breath, chest pain but complained of sharp 10 out of 10 pain in severity and described as "the worst pain of my life".  Interventional radiology obtained a stat CTA of the abdomen pelvis which showed a tiny area of extravasation from the right common femoral artery access site with associated hematoma extending from the right pelvis in the right lower abdomen.  Pressure was held for about 30 minutes and bleeding stopped.  Because of this episode and hematoma [facility] was called to admit this patient for observation for his hematoma.  ED Course: None; Patient was a direct Admission  Review of Systems: As per HPI otherwise 10 point review of systems negative.   Past Medical History:  Diagnosis Date  . Arthritis    generalized-back  . Cirrhosis ([place])   . Cirrhosis, alcoholic ([place])   . Esophageal varices ([place])   . Hearing impaired person, bilateral    hearing aids bilaterally  . Hepatocellular carcinoma ([place])   . Hypertension    Past Surgical History:  Procedure Laterality Date  . HERNIA REPAIR     RIH  . IR ANGIOGRAM  SELECTIVE EACH ADDITIONAL VESSEL  07/25/2017  . IR ANGIOGRAM SELECTIVE EACH ADDITIONAL VESSEL  07/25/2017  . IR ANGIOGRAM SELECTIVE EACH ADDITIONAL VESSEL  07/25/2017  . IR ANGIOGRAM SELECTIVE EACH ADDITIONAL VESSEL  07/25/2017  . IR ANGIOGRAM SELECTIVE EACH ADDITIONAL VESSEL  12/08/2017  . IR ANGIOGRAM SELECTIVE EACH ADDITIONAL VESSEL  12/08/2017  . IR ANGIOGRAM SELECTIVE EACH ADDITIONAL VESSEL  12/08/2017  . IR ANGIOGRAM SELECTIVE EACH ADDITIONAL VESSEL  12/08/2017  . IR ANGIOGRAM SELECTIVE EACH ADDITIONAL VESSEL  12/08/2017  . IR ANGIOGRAM VISCERAL SELECTIVE  07/25/2017  . IR ANGIOGRAM VISCERAL SELECTIVE  07/25/2017  . IR ANGIOGRAM VISCERAL SELECTIVE  12/08/2017  . IR ANGIOGRAM VISCERAL SELECTIVE  12/08/2017  . IR EMBO ARTERIAL NOT HEMORR HEMANG INC GUIDE ROADMAPPING  12/08/2017  . IR EMBO TUMOR ORGAN ISCHEMIA INFARCT INC GUIDE ROADMAPPING  07/25/2017  . IR GENERIC HISTORICAL  06/17/2016   IR RADIOLOGIST EVAL & MGMT 06/17/2016 Sandi Mariscal, MD [facility] INTERV RAD  . IR GENERIC HISTORICAL  07/21/2016   IR RADIOLOGIST EVAL & MGMT 07/21/2016 Sandi Mariscal, MD [facility] INTERV RAD  . IR RADIOLOGIST EVAL & MGMT  10/26/2016  . IR RADIOLOGIST EVAL & MGMT  04/05/2017  . IR RADIOLOGIST EVAL & MGMT  01/11/2017  . IR RADIOLOGIST EVAL & MGMT  06/28/2017  . IR RADIOLOGIST EVAL & MGMT  08/18/2017  . IR RADIOLOGIST EVAL & MGMT  11/17/2017  . IR US GUIDE VASC ACCESS RIGHT  07/25/2017  . IR US GUIDE VASC ACCESS RIGHT  12/08/2017  . Left rotator cuff  SOCIAL HISTORY  reports that he quit smoking about 31 years ago. He has never used smokeless tobacco. He reports that he does not drink alcohol or use drugs.  Allergies  Allergen Reactions  . Erythromycin Other (See Comments)    Abd cramps   FAMILY HISTORY History reviewed. No pertinent family history associated with patient's presenting complaint.  Prior to Admission medications   Medication Sig Start Date End Date Taking? Authorizing Provider  propranolol (INDERAL) 20 MG tablet  Take 20 mg by mouth 2 (two) times daily. 05/31/16  Yes [provider]  rifaximin (XIFAXAN) 550 MG TABS tablet Take 550 mg by mouth 2 (two) times daily.   Yes [provider]  sodium chloride (OCEAN) 0.65 % SOLN nasal spray Place 1 spray into both nostrils as needed for congestion.   Yes [provider]  traMADol (ULTRAM) 50 MG tablet Take 50 mg by mouth every 6 (six) hours as needed for moderate pain or severe pain.   Yes [provider]  vitamin B-12 (CYANOCOBALAMIN) 100 MCG tablet Take 100 mcg by mouth daily.   Yes [provider]  vitamin C (ASCORBIC ACID) 500 MG tablet Take 500 mg by mouth daily.   Yes [provider]  cholecalciferol (VITAMIN D) 1000 units tablet Take 2,000 Units by mouth daily.    [provider]  loratadine-pseudoephedrine (CLARITIN-D 24-HOUR) 10-240 MG 24 hr tablet Take 1 tablet by mouth daily as needed for allergies.     [provider]  mirtazapine (REMERON) 15 MG tablet Take 15 mg by mouth at bedtime.    [provider]  tamsulosin (FLOMAX) 0.4 MG CAPS capsule TAKE 1 capsule Once in the evening Orally 30 day(s) 05/20/17   [provider]   Physical Exam: Vitals:   12/08/17 1612 12/08/17 1625 12/08/17 1651 12/08/17 1736  BP: (!) 156/71 (!) 141/74 132/78 124/86  Pulse: (!) 50 (!) 52 (!) 50 (!) 55  Resp: '18 16 16 16  ' Temp:    97.8 F (36.6 C)  TempSrc:    Oral  SpO2: 95% 95% 97% 100%   Constitutional: WN/WD [place] male in NAD and appears calm and comfortable Eyes:  Lids and conjunctivae normal, sclerae anicteric  ENMT: External Ears, Nose appear normal. Hard of hearing.  Neck: Appears normal, supple, no cervical masses, normal ROM, no appreciable thyromegaly; no JVD Respiratory: Diminished to auscultation bilaterally, no wheezing, rales, rhonchi or crackles. Normal respiratory effort and patient is not tachypenic. No accessory muscle use.  Cardiovascular: RRR, no murmurs /  rubs / gallops. S1 and S2 auscultated. No extremity edema Abdomen: Soft, Tender to palpate in Right Lower Quadrant, non-distended. No masses palpated. No appreciable hepatosplenomegaly. Bowel sounds positive x4.  GU: Deferred. Has indwelling foley catheter in place Musculoskeletal: No clubbing / cyanosis of digits/nails. No joint deformity upper and lower extremities.  Skin: No rashes, lesions, ulcers on a limited skin evaluation. No induration; Warm and dry.  Neurologic: CN 2-12 grossly intact with no focal deficits. Psychiatric: Normal judgment and insight. Alert and oriented x 3. Normal mood and appropriate affect.   Labs on Admission: I have personally reviewed following labs and imaging studies  CBC: Recent Labs  Lab 12/08/17 0803  WBC 6.5  NEUTROABS 2.3  HGB 14.4  HCT 41.1  MCV 96.3  PLT 83*   Basic Metabolic Panel: Recent Labs  Lab 12/08/17 0803  NA 138  K 5.1  CL 100*  CO2 25  GLUCOSE 83  BUN 11  CREATININE 1.07  CALCIUM 9.5   GFR: Estimated Creatinine Clearance: 51.1 mL/min (by C-G formula based on SCr of 1.07 mg/dL). Liver Function Tests: Recent Labs  Lab 12/08/17 0803  AST 75*  ALT 45  ALKPHOS 124  BILITOT 1.6*  PROT 8.5*  ALBUMIN 5.1*   No results for input(s): LIPASE, AMYLASE in the last 168 hours. No results for input(s): AMMONIA in the last 168 hours. Coagulation Profile: Recent Labs  Lab 12/08/17 0803  INR 1.12   Cardiac Enzymes: No results for input(s): CKTOTAL, CKMB, CKMBINDEX, TROPONINI in the last 168 hours. BNP (last 3 results) No results for input(s): PROBNP in the last 8760 hours. HbA1C: No results for input(s): HGBA1C in the last 72 hours. CBG: No results for input(s): GLUCAP in the last 168 hours. Lipid Profile: No results for input(s): CHOL, HDL, LDLCALC, TRIG, CHOLHDL, LDLDIRECT in the last 72 hours. Thyroid Function Tests: No results for input(s): TSH, T4TOTAL, FREET4, T3FREE, THYROIDAB in the last 72 hours. Anemia  Panel: No results for input(s): VITAMINB12, FOLATE, FERRITIN, TIBC, IRON, RETICCTPCT in the last 72 hours. Urine analysis: No results found for: COLORURINE, APPEARANCEUR, LABSPEC, PHURINE, GLUCOSEU, HGBUR, BILIRUBINUR, KETONESUR, PROTEINUR, UROBILINOGEN, NITRITE, LEUKOCYTESUR Sepsis Labs: !!!!!!!!!!!!!!!!!!!!!!!!!!!!!!!!!!!!!!!!!!!! '@[email or web address]' (procalcitonin:4,lacticidven:4) )No results found for this or any previous visit (from the past 240 hour(s)).   Radiological Exams on Admission: Nm Liver Img Spect  Result Date: 12/08/2017 CLINICAL DATA:  Pre yttrium 90 radioembolization evaluation. EXAM: NUCLEAR MEDICINE LIVER SCAN; ULTRASOUND MISCELLANEOUS SOFT TISSUE TECHNIQUE: Abdominal images were obtained in multiple projections after intrahepatic arterial injection of radiopharmaceutical. SPECT imaging was performed. Lung shunt calculation was performed. RADIOPHARMACEUTICALS:  4.18mllicurie MAA TECHNETIUM TO 90M ALBUMIN AGGREGATED COMPARISON:  MRI 06/28/2017, angiography 12/08/2017 FINDINGS: The injected microaggregated albumin localizes within the RIGHT hepatic lobe. Small focus of activity in the porta hepatis. No evidence of activity within the stomach, duodenum, or bowel. Calculated shunt fraction to the lungs equals 8.0%. IMPRESSION: 1. Injected MAA tracer activity localizes to the RIGHT hepatic lobe. 2. Small focus of radiotracer activity within the porta hepatis. Recommend catheter angiography evaluation at time of treatment. 3. Lung shunt fraction equals 8.0%. Findings conveyed toJOHN WATTS on 12/08/2017  at16:08. Electronically Signed   By: SSuzy BouchardM.D.   On: 12/08/2017 16:09   Ir Angiogram Visceral Selective  Result Date: 12/08/2017 INDICATION: History of multifocal hepatocellular carcinoma. Patient presents today for Y 90 mapping procedure. Please refer to formal consultation in the epic EMR dated 11/17/2017 for additional details. EXAM: 1. ULTRASOUND GUIDANCE FOR ARTERIAL ACCESS 2.  CELIAC AND SUPERIOR MESENTERIC ARTERIOGRAM (1st ORDER) 3. SELECTIVE COMMON AND PROPER HEPATIC ARTERIOGRAMS 4. SELECTIVE ARTERIOGRAM OF ACCESSORY PANCREATICODUODENAL ARTERY AND PERCUTANEOUS COIL EMBOLIZATION 5. SELECTIVE ARTERIOGRAM OF ACCESSORY RIGHT HEPATIC ARTERY AND ADMINISTRATION OF Tc96mAA 6. SELECTIVE ARTERIOGRAM OF THE RIGHT HEPATIC ARTERY AND ADMINISTRATION OF Tc9929mA COMPARISON:  Hepatic bland embolization - 07/25/2017; abdominal MRI - 11/01/2017; CTA of the abdomen pelvis - 07/07/2017 MEDICATIONS: None RADIOPHARMACEUTICALS:  A total of 5 mCi Tc99m26m was administered, 2 mCi via an accessory right hepatic artery and 3 mCi via the right hepatic artery. CONTRAST:  75 cc Isovue 300 ANESTHESIA/SEDATION: Moderate (conscious) sedation was employed during this procedure. A total of Versed 3 mg and Fentanyl 150 mcg was administered intravenously. Moderate Sedation Time: 59 minutes. The patient's level of consciousness and vital signs were monitored continuously by radiology nursing throughout the procedure under my direct supervision. FLUOROSCOPY TIME:  14 minutes, 48 seconds (584 mGy) ACCESS: Right common femoral  artery; hemostasis achieved with manual compression. COMPLICATIONS: None immediate. TECHNIQUE: Informed written consent was obtained from the patient after a discussion of the risks, benefits and alternatives to treatment. Questions regarding the procedure were encouraged and answered. A timeout was performed prior to the initiation of the procedure. The right groin was prepped and draped in the usual sterile fashion, and a sterile drape was applied covering the operative field. Maximum barrier sterile technique with sterile gowns and gloves were used for the procedure. A timeout was performed prior to the initiation of the procedure. Local anesthesia was provided with 1% lidocaine. The right femoral head was marked fluoroscopically. Under ultrasound guidance, the right common femoral artery was  accessed with a micropuncture kit after the overlying soft tissues were anesthetized with 1% lidocaine. An ultrasound image was saved for documentation purposes. The micropuncture sheath was exchanged for a 5 [place] vascular sheath over a Bentson wire. A closure arteriogram was performed through the side of the sheath confirming access within the right common femoral artery. Over a Bentson wire, a Mickelson catheter was advanced to the level of the thoracic aorta where it was back bled and flushed. The catheter was then utilized to select the superior mesenteric artery and a superior mesenteric arteriogram with delayed portal venous imaging was performed. The Mickelson catheter was then advanced cranially and utilized to select the celiac artery and a celiac arteriogram was performed. Utilizing a fathom 14 microwire, a regular renegade micro catheter was utilized to select common and proper hepatic arteries and selective arteriograms were performed. The Renegade microcatheter was utilized to select an accessory pancreaticoduodenal artery noted to arise subjacent to the left hepatic artery. Selective arteriogram was performed confirming appropriate positioning and the vessel was subsequent percutaneously coil embolized with 3 overlapping 2 mm x 5 mm partial coils to near the vessel's origin. The microcatheter was retracted into the proper hepatic artery and a post embolization proper hepatic arteriogram was performed Next, the microcatheter was utilized to select the accessory right hepatic artery and accessory right hepatic arteriogram was performed. From this location, 2 mCi of technetium 99 MAA was administered. Next the microcatheter was advanced into the right hepatic artery and a selective right hepatic arteriogram was performed. From the location proximal to the vessels bifurcation, 3 mCi of technetium 99 MAA was administered. At this point, the procedure was terminated. All wires and catheters and sheaths  were removed from the patient. Hemostasis was achieved at the right groin and access site with manual compression. A dressing was placed. The patient tolerated procedure well without immediate postprocedural complication. The patient was escorted to nuclear medicine department for planar imaging. FINDINGS: Selective superior mesenteric arteriogram was again negative for accessory or replaced hepatic arterial supply. Late portal venous phase imaging demonstrates patency of the main, right and left portal veins. Celiac arteriogram re-demonstrates an accessory right hepatic artery supplying the medial aspect of the right lobe of the liver. 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 Successful split dose administration of a total of 5 mCi of technetium 20mMAA via the right hepatic and accessory right hepatic arteries. IMPRESSION: 1. Successful pre Y-90 arteriogram with percutaneous coil embolization of the pancreaticoduodenal artery. 2. Successful split dose administration of a total of 5 mCi of technetium 967mAA via the right hepatic and accessory right hepatic arteries. Awaiting results of nuclear medicine liver  scan. PLAN: Pending the results of the nuclear medicine liver scan, the patient will return Y90 radioembolization the right hepatic artery. Pending the patient's recovery from this initial treatment session, the patient will return for radioembolization of the accessory right hepatic artery. Electronically Signed   By: Sandi Mariscal M.D.   On: 12/08/2017 13:45   Ir Angiogram Visceral Selective  Result Date: 12/08/2017 INDICATION: History of multifocal hepatocellular carcinoma. Patient presents today for Y 90 mapping procedure. Please refer to formal consultation in the epic EMR dated 11/17/2017 for additional details. EXAM: 1. ULTRASOUND GUIDANCE FOR  ARTERIAL ACCESS 2. CELIAC AND SUPERIOR MESENTERIC ARTERIOGRAM (1st ORDER) 3. SELECTIVE COMMON AND PROPER HEPATIC ARTERIOGRAMS 4. SELECTIVE ARTERIOGRAM OF ACCESSORY PANCREATICODUODENAL ARTERY AND PERCUTANEOUS COIL EMBOLIZATION 5. SELECTIVE ARTERIOGRAM OF ACCESSORY RIGHT HEPATIC ARTERY AND ADMINISTRATION OF Tc25mMAA 6. SELECTIVE ARTERIOGRAM OF THE RIGHT HEPATIC ARTERY AND ADMINISTRATION OF Tc965mAA COMPARISON:  Hepatic bland embolization - 07/25/2017; abdominal MRI - 11/01/2017; CTA of the abdomen pelvis - 07/07/2017 MEDICATIONS: None RADIOPHARMACEUTICALS:  A total of 5 mCi Tc9965mA was administered, 2 mCi via an accessory right hepatic artery and 3 mCi via the right hepatic artery. CONTRAST:  75 cc Isovue 300 ANESTHESIA/SEDATION: Moderate (conscious) sedation was employed during this procedure. A total of Versed 3 mg and Fentanyl 150 mcg was administered intravenously. Moderate Sedation Time: 59 minutes. The patient's level of consciousness and vital signs were monitored continuously by radiology nursing throughout the procedure under my direct supervision. FLUOROSCOPY TIME:  14 minutes, 48 seconds (584 mGy) ACCESS: Right common femoral artery; hemostasis achieved with manual compression. COMPLICATIONS: None immediate. TECHNIQUE: Informed written consent was obtained from the patient after a discussion of the risks, benefits and alternatives to treatment. Questions regarding the procedure were encouraged and answered. A timeout was performed prior to the initiation of the procedure. The right groin was prepped and draped in the usual sterile fashion, and a sterile drape was applied covering the operative field. Maximum barrier sterile technique with sterile gowns and gloves were used for the procedure. A timeout was performed prior to the initiation of the procedure. Local anesthesia was provided with 1% lidocaine. The right femoral head was marked fluoroscopically. Under ultrasound guidance, the right common  femoral artery was accessed with a micropuncture kit after the overlying soft tissues were anesthetized with 1% lidocaine. An ultrasound image was saved for documentation purposes. The micropuncture sheath was exchanged for a 5 Fre[place] sheath over a Bentson wire. A closure arteriogram was performed through the side of the sheath confirming access within the right common femoral artery. Over a Bentson wire, a Mickelson catheter was advanced to the level of the thoracic aorta where it was back bled and flushed. The catheter was then utilized to select the superior mesenteric artery and a superior mesenteric arteriogram with delayed portal venous imaging was performed. The Mickelson catheter was then advanced cranially and utilized to select the celiac artery and a celiac arteriogram was performed. Utilizing a fathom 14 microwire, a regular renegade micro catheter was utilized to select common and proper hepatic arteries and selective arteriograms were performed. The Renegade microcatheter was utilized to select an accessory pancreaticoduodenal artery noted to arise subjacent to the left hepatic artery. Selective arteriogram was performed confirming appropriate positioning and the vessel was subsequent percutaneously coil embolized with 3 overlapping 2 mm x 5 mm partial coils to near the vessel's origin. The microcatheter was retracted into the proper hepatic artery and a post embolization proper hepatic arteriogram  was performed Next, the microcatheter was utilized to select the accessory right hepatic artery and accessory right hepatic arteriogram was performed. From this location, 2 mCi of technetium 99 MAA was administered. Next the microcatheter was advanced into the right hepatic artery and a selective right hepatic arteriogram was performed. From the location proximal to the vessels bifurcation, 3 mCi of technetium 99 MAA was administered. At this point, the procedure was terminated. All wires and  catheters and sheaths were removed from the patient. Hemostasis was achieved at the right groin and access site with manual compression. A dressing was placed. The patient tolerated procedure well without immediate postprocedural complication. The patient was escorted to nuclear medicine department for planar imaging. FINDINGS: Selective superior mesenteric arteriogram was again negative for accessory or replaced hepatic arterial supply. Late portal venous phase imaging demonstrates patency of the main, right and left portal veins. Celiac arteriogram re-demonstrates an accessory right hepatic artery supplying the medial aspect of the right lobe of the liver. 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 Successful split dose administration of a total of 5 mCi of technetium 55mMAA via the right hepatic and accessory right hepatic arteries. IMPRESSION: 1. Successful pre Y-90 arteriogram with percutaneous coil embolization of the pancreaticoduodenal artery. 2. Successful split dose administration of a total of 5 mCi of technetium 963mAA via the right hepatic and accessory right hepatic arteries. Awaiting results of nuclear medicine liver scan. PLAN: Pending the results of the nuclear medicine liver scan, the patient will return Y90 radioembolization the right hepatic artery. Pending the patient's recovery from this initial treatment session, the patient will return for radioembolization of the accessory right hepatic artery. Electronically Signed   By: JoSandi Mariscal.D.   On: 12/08/2017 13:45   Ir Angiogram Selective Each Additional Vessel  Result Date: 12/08/2017 INDICATION: History of multifocal hepatocellular carcinoma. Patient presents today for Y 90 mapping procedure. Please refer to formal consultation in the epic EMR dated 11/17/2017 for additional details.  EXAM: 1. ULTRASOUND GUIDANCE FOR ARTERIAL ACCESS 2. CELIAC AND SUPERIOR MESENTERIC ARTERIOGRAM (1st ORDER) 3. SELECTIVE COMMON AND PROPER HEPATIC ARTERIOGRAMS 4. SELECTIVE ARTERIOGRAM OF ACCESSORY PANCREATICODUODENAL ARTERY AND PERCUTANEOUS COIL EMBOLIZATION 5. SELECTIVE ARTERIOGRAM OF ACCESSORY RIGHT HEPATIC ARTERY AND ADMINISTRATION OF Tc9942mA 6. SELECTIVE ARTERIOGRAM OF THE RIGHT HEPATIC ARTERY AND ADMINISTRATION OF Tc99m82m COMPARISON:  Hepatic bland embolization - 07/25/2017; abdominal MRI - 11/01/2017; CTA of the abdomen pelvis - 07/07/2017 MEDICATIONS: None RADIOPHARMACEUTICALS:  A total of 5 mCi Tc99m 70mwas administered, 2 mCi via an accessory right hepatic artery and 3 mCi via the right hepatic artery. CONTRAST:  75 cc Isovue 300 ANESTHESIA/SEDATION: Moderate (conscious) sedation was employed during this procedure. A total of Versed 3 mg and Fentanyl 150 mcg was administered intravenously. Moderate Sedation Time: 59 minutes. The patient's level of consciousness and vital signs were monitored continuously by radiology nursing throughout the procedure under my direct supervision. FLUOROSCOPY TIME:  14 minutes, 48 seconds (584 mGy) ACCESS: Right common femoral artery; hemostasis achieved with manual compression. COMPLICATIONS: None immediate. TECHNIQUE: Informed written consent was obtained from the patient after a discussion of the risks, benefits and alternatives to treatment. Questions regarding the procedure were encouraged and answered. A timeout was performed prior to the initiation of the procedure. The right groin was prepped and draped in the usual sterile fashion, and a  sterile drape was applied covering the operative field. Maximum barrier sterile technique with sterile gowns and gloves were used for the procedure. A timeout was performed prior to the initiation of the procedure. Local anesthesia was provided with 1% lidocaine. The right femoral head was marked fluoroscopically. Under  ultrasound guidance, the right common femoral artery was accessed with a micropuncture kit after the overlying soft tissues were anesthetized with 1% lidocaine. An ultrasound image was saved for documentation purposes. The micropuncture sheath was exchanged for a 5 [place] vascular sheath over a Bentson wire. A closure arteriogram was performed through the side of the sheath confirming access within the right common femoral artery. Over a Bentson wire, a Mickelson catheter was advanced to the level of the thoracic aorta where it was back bled and flushed. The catheter was then utilized to select the superior mesenteric artery and a superior mesenteric arteriogram with delayed portal venous imaging was performed. The Mickelson catheter was then advanced cranially and utilized to select the celiac artery and a celiac arteriogram was performed. Utilizing a fathom 14 microwire, a regular renegade micro catheter was utilized to select common and proper hepatic arteries and selective arteriograms were performed. The Renegade microcatheter was utilized to select an accessory pancreaticoduodenal artery noted to arise subjacent to the left hepatic artery. Selective arteriogram was performed confirming appropriate positioning and the vessel was subsequent percutaneously coil embolized with 3 overlapping 2 mm x 5 mm partial coils to near the vessel's origin. The microcatheter was retracted into the proper hepatic artery and a post embolization proper hepatic arteriogram was performed Next, the microcatheter was utilized to select the accessory right hepatic artery and accessory right hepatic arteriogram was performed. From this location, 2 mCi of technetium 99 MAA was administered. Next the microcatheter was advanced into the right hepatic artery and a selective right hepatic arteriogram was performed. From the location proximal to the vessels bifurcation, 3 mCi of technetium 99 MAA was administered. At this point, the  procedure was terminated. All wires and catheters and sheaths were removed from the patient. Hemostasis was achieved at the right groin and access site with manual compression. A dressing was placed. The patient tolerated procedure well without immediate postprocedural complication. The patient was escorted to nuclear medicine department for planar imaging. FINDINGS: Selective superior mesenteric arteriogram was again negative for accessory or replaced hepatic arterial supply. Late portal venous phase imaging demonstrates patency of the main, right and left portal veins. Celiac arteriogram re-demonstrates an accessory right hepatic artery supplying the medial aspect of the right lobe of the liver. 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 Successful split dose administration of a total of 5 mCi of technetium 51mMAA via the right hepatic and accessory right hepatic arteries. IMPRESSION: 1. Successful pre Y-90 arteriogram with percutaneous coil embolization of the pancreaticoduodenal artery. 2. Successful split dose administration of a total of 5 mCi of technetium 981mAA via the right hepatic and accessory right hepatic arteries. Awaiting results of nuclear medicine liver scan. PLAN: Pending the results of the nuclear medicine liver scan, the patient will return Y90 radioembolization the right hepatic artery. Pending the patient's recovery from this initial treatment session, the patient will return for radioembolization of the accessory right hepatic artery. Electronically Signed   By: JoSandi Mariscal.D.   On: 12/08/2017 13:45   Ir Angiogram Selective Each  Additional Vessel  Result Date: 12/08/2017 INDICATION: History of multifocal hepatocellular carcinoma. Patient presents today for Y 90 mapping procedure. Please refer to formal consultation in the epic EMR  dated 11/17/2017 for additional details. EXAM: 1. ULTRASOUND GUIDANCE FOR ARTERIAL ACCESS 2. CELIAC AND SUPERIOR MESENTERIC ARTERIOGRAM (1st ORDER) 3. SELECTIVE COMMON AND PROPER HEPATIC ARTERIOGRAMS 4. SELECTIVE ARTERIOGRAM OF ACCESSORY PANCREATICODUODENAL ARTERY AND PERCUTANEOUS COIL EMBOLIZATION 5. SELECTIVE ARTERIOGRAM OF ACCESSORY RIGHT HEPATIC ARTERY AND ADMINISTRATION OF Tc64mMAA 6. SELECTIVE ARTERIOGRAM OF THE RIGHT HEPATIC ARTERY AND ADMINISTRATION OF Tc935mAA COMPARISON:  Hepatic bland embolization - 07/25/2017; abdominal MRI - 11/01/2017; CTA of the abdomen pelvis - 07/07/2017 MEDICATIONS: None RADIOPHARMACEUTICALS:  A total of 5 mCi Tc9912mA was administered, 2 mCi via an accessory right hepatic artery and 3 mCi via the right hepatic artery. CONTRAST:  75 cc Isovue 300 ANESTHESIA/SEDATION: Moderate (conscious) sedation was employed during this procedure. A total of Versed 3 mg and Fentanyl 150 mcg was administered intravenously. Moderate Sedation Time: 59 minutes. The patient's level of consciousness and vital signs were monitored continuously by radiology nursing throughout the procedure under my direct supervision. FLUOROSCOPY TIME:  14 minutes, 48 seconds (584 mGy) ACCESS: Right common femoral artery; hemostasis achieved with manual compression. COMPLICATIONS: None immediate. TECHNIQUE: Informed written consent was obtained from the patient after a discussion of the risks, benefits and alternatives to treatment. Questions regarding the procedure were encouraged and answered. A timeout was performed prior to the initiation of the procedure. The right groin was prepped and draped in the usual sterile fashion, and a sterile drape was applied covering the operative field. Maximum barrier sterile technique with sterile gowns and gloves were used for the procedure. A timeout was performed prior to the initiation of the procedure. Local anesthesia was provided with 1% lidocaine. The right femoral head was  marked fluoroscopically. Under ultrasound guidance, the right common femoral artery was accessed with a micropuncture kit after the overlying soft tissues were anesthetized with 1% lidocaine. An ultrasound image was saved for documentation purposes. The micropuncture sheath was exchanged for a 5 FrePakistanscular sheath over a Bentson wire. A closure arteriogram was performed through the side of the sheath confirming access within the right common femoral artery. Over a Bentson wire, a Mickelson catheter was advanced to the level of the thoracic aorta where it was back bled and flushed. The catheter was then utilized to select the superior mesenteric artery and a superior mesenteric arteriogram with delayed portal venous imaging was performed. The Mickelson catheter was then advanced cranially and utilized to select the celiac artery and a celiac arteriogram was performed. Utilizing a fathom 14 microwire, a regular renegade micro catheter was utilized to select common and proper hepatic arteries and selective arteriograms were performed. The Renegade microcatheter was utilized to select an accessory pancreaticoduodenal artery noted to arise subjacent to the left hepatic artery. Selective arteriogram was performed confirming appropriate positioning and the vessel was subsequent percutaneously coil embolized with 3 overlapping 2 mm x 5 mm partial coils to near the vessel's origin. The microcatheter was retracted into the proper hepatic artery and a post embolization proper hepatic arteriogram was performed Next, the microcatheter was utilized to select the accessory right hepatic artery and accessory right hepatic arteriogram was performed. From this location, 2 mCi of technetium 99 MAA was administered. Next the microcatheter was advanced into the right hepatic artery and a selective right hepatic arteriogram was performed. From the location proximal to the vessels bifurcation, 3 mCi  of technetium 99 MAA was  administered. At this point, the procedure was terminated. All wires and catheters and sheaths were removed from the patient. Hemostasis was achieved at the right groin and access site with manual compression. A dressing was placed. The patient tolerated procedure well without immediate postprocedural complication. The patient was escorted to nuclear medicine department for planar imaging. FINDINGS: Selective superior mesenteric arteriogram was again negative for accessory or replaced hepatic arterial supply. Late portal venous phase imaging demonstrates patency of the main, right and left portal veins. Celiac arteriogram re-demonstrates an accessory right hepatic artery supplying the medial aspect of the right lobe of the liver. 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 Successful split dose administration of a total of 5 mCi of technetium 47mMAA via the right hepatic and accessory right hepatic arteries. IMPRESSION: 1. Successful pre Y-90 arteriogram with percutaneous coil embolization of the pancreaticoduodenal artery. 2. Successful split dose administration of a total of 5 mCi of technetium 99mAA via the right hepatic and accessory right hepatic arteries. Awaiting results of nuclear medicine liver scan. PLAN: Pending the results of the nuclear medicine liver scan, the patient will return Y90 radioembolization the right hepatic artery. Pending the patient's recovery from this initial treatment session, the patient will return for radioembolization of the accessory right hepatic artery. Electronically Signed   By: JoSandi Mariscal.D.   On: 12/08/2017 13:45   Ir Angiogram Selective Each Additional Vessel  Result Date: 12/08/2017 INDICATION: History of multifocal hepatocellular carcinoma. Patient presents today for Y 90 mapping procedure. Please refer to formal  consultation in the epic EMR dated 11/17/2017 for additional details. EXAM: 1. ULTRASOUND GUIDANCE FOR ARTERIAL ACCESS 2. CELIAC AND SUPERIOR MESENTERIC ARTERIOGRAM (1st ORDER) 3. SELECTIVE COMMON AND PROPER HEPATIC ARTERIOGRAMS 4. SELECTIVE ARTERIOGRAM OF ACCESSORY PANCREATICODUODENAL ARTERY AND PERCUTANEOUS COIL EMBOLIZATION 5. SELECTIVE ARTERIOGRAM OF ACCESSORY RIGHT HEPATIC ARTERY AND ADMINISTRATION OF Tc9957mA 6. SELECTIVE ARTERIOGRAM OF THE RIGHT HEPATIC ARTERY AND ADMINISTRATION OF Tc99m39m COMPARISON:  Hepatic bland embolization - 07/25/2017; abdominal MRI - 11/01/2017; CTA of the abdomen pelvis - 07/07/2017 MEDICATIONS: None RADIOPHARMACEUTICALS:  A total of 5 mCi Tc99m 55mwas administered, 2 mCi via an accessory right hepatic artery and 3 mCi via the right hepatic artery. CONTRAST:  75 cc Isovue 300 ANESTHESIA/SEDATION: Moderate (conscious) sedation was employed during this procedure. A total of Versed 3 mg and Fentanyl 150 mcg was administered intravenously. Moderate Sedation Time: 59 minutes. The patient's level of consciousness and vital signs were monitored continuously by radiology nursing throughout the procedure under my direct supervision. FLUOROSCOPY TIME:  14 minutes, 48 seconds (584 mGy) ACCESS: Right common femoral artery; hemostasis achieved with manual compression. COMPLICATIONS: None immediate. TECHNIQUE: Informed written consent was obtained from the patient after a discussion of the risks, benefits and alternatives to treatment. Questions regarding the procedure were encouraged and answered. A timeout was performed prior to the initiation of the procedure. The right groin was prepped and draped in the usual sterile fashion, and a sterile drape was applied covering the operative field. Maximum barrier sterile technique with sterile gowns and gloves were used for the procedure. A timeout was performed prior to the initiation of the procedure. Local anesthesia was provided with 1%  lidocaine. The right femoral head was marked fluoroscopically. Under ultrasound guidance, the right common femoral artery was accessed with a  micropuncture kit after the overlying soft tissues were anesthetized with 1% lidocaine. An ultrasound image was saved for documentation purposes. The micropuncture sheath was exchanged for a 5 [place] vascular sheath over a Bentson wire. A closure arteriogram was performed through the side of the sheath confirming access within the right common femoral artery. Over a Bentson wire, a Mickelson catheter was advanced to the level of the thoracic aorta where it was back bled and flushed. The catheter was then utilized to select the superior mesenteric artery and a superior mesenteric arteriogram with delayed portal venous imaging was performed. The Mickelson catheter was then advanced cranially and utilized to select the celiac artery and a celiac arteriogram was performed. Utilizing a fathom 14 microwire, a regular renegade micro catheter was utilized to select common and proper hepatic arteries and selective arteriograms were performed. The Renegade microcatheter was utilized to select an accessory pancreaticoduodenal artery noted to arise subjacent to the left hepatic artery. Selective arteriogram was performed confirming appropriate positioning and the vessel was subsequent percutaneously coil embolized with 3 overlapping 2 mm x 5 mm partial coils to near the vessel's origin. The microcatheter was retracted into the proper hepatic artery and a post embolization proper hepatic arteriogram was performed Next, the microcatheter was utilized to select the accessory right hepatic artery and accessory right hepatic arteriogram was performed. From this location, 2 mCi of technetium 99 MAA was administered. Next the microcatheter was advanced into the right hepatic artery and a selective right hepatic arteriogram was performed. From the location proximal to the vessels bifurcation, 3  mCi of technetium 99 MAA was administered. At this point, the procedure was terminated. All wires and catheters and sheaths were removed from the patient. Hemostasis was achieved at the right groin and access site with manual compression. A dressing was placed. The patient tolerated procedure well without immediate postprocedural complication. The patient was escorted to nuclear medicine department for planar imaging. FINDINGS: Selective superior mesenteric arteriogram was again negative for accessory or replaced hepatic arterial supply. Late portal venous phase imaging demonstrates patency of the main, right and left portal veins. Celiac arteriogram re-demonstrates an accessory right hepatic artery supplying the medial aspect of the right lobe of the liver. 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 Successful split dose administration of a total of 5 mCi of technetium 31mMAA via the right hepatic and accessory right hepatic arteries. IMPRESSION: 1. Successful pre Y-90 arteriogram with percutaneous coil embolization of the pancreaticoduodenal artery. 2. Successful split dose administration of a total of 5 mCi of technetium 963mAA via the right hepatic and accessory right hepatic arteries. Awaiting results of nuclear medicine liver scan. PLAN: Pending the results of the nuclear medicine liver scan, the patient will return Y90 radioembolization the right hepatic artery. Pending the patient's recovery from this initial treatment session, the patient will return for radioembolization of the accessory right hepatic artery. Electronically Signed   By: JoSandi Mariscal.D.   On: 12/08/2017 13:45   Ir Angiogram Selective Each Additional Vessel  Result Date: 12/08/2017 INDICATION: History of multifocal hepatocellular carcinoma. Patient presents today for Y 90 mapping  procedure. Please refer to formal consultation in the epic EMR dated 11/17/2017 for additional details. EXAM: 1. ULTRASOUND GUIDANCE FOR ARTERIAL ACCESS 2. CELIAC AND SUPERIOR MESENTERIC ARTERIOGRAM (1st ORDER) 3. SELECTIVE COMMON AND PROPER HEPATIC ARTERIOGRAMS 4. SELECTIVE ARTERIOGRAM  OF ACCESSORY PANCREATICODUODENAL ARTERY AND PERCUTANEOUS COIL EMBOLIZATION 5. SELECTIVE ARTERIOGRAM OF ACCESSORY RIGHT HEPATIC ARTERY AND ADMINISTRATION OF Tc69mMAA 6. SELECTIVE ARTERIOGRAM OF THE RIGHT HEPATIC ARTERY AND ADMINISTRATION OF Tc963mAA COMPARISON:  Hepatic bland embolization - 07/25/2017; abdominal MRI - 11/01/2017; CTA of the abdomen pelvis - 07/07/2017 MEDICATIONS: None RADIOPHARMACEUTICALS:  A total of 5 mCi Tc9974mA was administered, 2 mCi via an accessory right hepatic artery and 3 mCi via the right hepatic artery. CONTRAST:  75 cc Isovue 300 ANESTHESIA/SEDATION: Moderate (conscious) sedation was employed during this procedure. A total of Versed 3 mg and Fentanyl 150 mcg was administered intravenously. Moderate Sedation Time: 59 minutes. The patient's level of consciousness and vital signs were monitored continuously by radiology nursing throughout the procedure under my direct supervision. FLUOROSCOPY TIME:  14 minutes, 48 seconds (584 mGy) ACCESS: Right common femoral artery; hemostasis achieved with manual compression. COMPLICATIONS: None immediate. TECHNIQUE: Informed written consent was obtained from the patient after a discussion of the risks, benefits and alternatives to treatment. Questions regarding the procedure were encouraged and answered. A timeout was performed prior to the initiation of the procedure. The right groin was prepped and draped in the usual sterile fashion, and a sterile drape was applied covering the operative field. Maximum barrier sterile technique with sterile gowns and gloves were used for the procedure. A timeout was performed prior to the initiation of the procedure. Local  anesthesia was provided with 1% lidocaine. The right femoral head was marked fluoroscopically. Under ultrasound guidance, the right common femoral artery was accessed with a micropuncture kit after the overlying soft tissues were anesthetized with 1% lidocaine. An ultrasound image was saved for documentation purposes. The micropuncture sheath was exchanged for a 5 FrePakistanscular sheath over a Bentson wire. A closure arteriogram was performed through the side of the sheath confirming access within the right common femoral artery. Over a Bentson wire, a Mickelson catheter was advanced to the level of the thoracic aorta where it was back bled and flushed. The catheter was then utilized to select the superior mesenteric artery and a superior mesenteric arteriogram with delayed portal venous imaging was performed. The Mickelson catheter was then advanced cranially and utilized to select the celiac artery and a celiac arteriogram was performed. Utilizing a fathom 14 microwire, a regular renegade micro catheter was utilized to select common and proper hepatic arteries and selective arteriograms were performed. The Renegade microcatheter was utilized to select an accessory pancreaticoduodenal artery noted to arise subjacent to the left hepatic artery. Selective arteriogram was performed confirming appropriate positioning and the vessel was subsequent percutaneously coil embolized with 3 overlapping 2 mm x 5 mm partial coils to near the vessel's origin. The microcatheter was retracted into the proper hepatic artery and a post embolization proper hepatic arteriogram was performed Next, the microcatheter was utilized to select the accessory right hepatic artery and accessory right hepatic arteriogram was performed. From this location, 2 mCi of technetium 99 MAA was administered. Next the microcatheter was advanced into the right hepatic artery and a selective right hepatic arteriogram was performed. From the location proximal  to the vessels bifurcation, 3 mCi of technetium 99 MAA was administered. At this point, the procedure was terminated. All wires and catheters and sheaths were removed from the patient. Hemostasis was achieved at the right groin and access site with manual compression. A dressing was placed. The patient tolerated procedure well without immediate postprocedural complication. The patient was escorted to nuclear medicine department for  planar imaging. FINDINGS: Selective superior mesenteric arteriogram was again negative for accessory or replaced hepatic arterial supply. Late portal venous phase imaging demonstrates patency of the main, right and left portal veins. Celiac arteriogram re-demonstrates an accessory right hepatic artery supplying the medial aspect of the right lobe of the liver. 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 Successful split dose administration of a total of 5 mCi of technetium 77mMAA via the right hepatic and accessory right hepatic arteries. IMPRESSION: 1. Successful pre Y-90 arteriogram with percutaneous coil embolization of the pancreaticoduodenal artery. 2. Successful split dose administration of a total of 5 mCi of technetium 983mAA via the right hepatic and accessory right hepatic arteries. Awaiting results of nuclear medicine liver scan. PLAN: Pending the results of the nuclear medicine liver scan, the patient will return Y90 radioembolization the right hepatic artery. Pending the patient's recovery from this initial treatment session, the patient will return for radioembolization of the accessory right hepatic artery. Electronically Signed   By: JoSandi Mariscal.D.   On: 12/08/2017 13:45   Ir Angiogram Selective Each Additional Vessel  Result Date: 12/08/2017 INDICATION: History of multifocal hepatocellular carcinoma. Patient  presents today for Y 90 mapping procedure. Please refer to formal consultation in the epic EMR dated 11/17/2017 for additional details. EXAM: 1. ULTRASOUND GUIDANCE FOR ARTERIAL ACCESS 2. CELIAC AND SUPERIOR MESENTERIC ARTERIOGRAM (1st ORDER) 3. SELECTIVE COMMON AND PROPER HEPATIC ARTERIOGRAMS 4. SELECTIVE ARTERIOGRAM OF ACCESSORY PANCREATICODUODENAL ARTERY AND PERCUTANEOUS COIL EMBOLIZATION 5. SELECTIVE ARTERIOGRAM OF ACCESSORY RIGHT HEPATIC ARTERY AND ADMINISTRATION OF Tc9990mA 6. SELECTIVE ARTERIOGRAM OF THE RIGHT HEPATIC ARTERY AND ADMINISTRATION OF Tc99m66m COMPARISON:  Hepatic bland embolization - 07/25/2017; abdominal MRI - 11/01/2017; CTA of the abdomen pelvis - 07/07/2017 MEDICATIONS: None RADIOPHARMACEUTICALS:  A total of 5 mCi Tc99m 34mwas administered, 2 mCi via an accessory right hepatic artery and 3 mCi via the right hepatic artery. CONTRAST:  75 cc Isovue 300 ANESTHESIA/SEDATION: Moderate (conscious) sedation was employed during this procedure. A total of Versed 3 mg and Fentanyl 150 mcg was administered intravenously. Moderate Sedation Time: 59 minutes. The patient's level of consciousness and vital signs were monitored continuously by radiology nursing throughout the procedure under my direct supervision. FLUOROSCOPY TIME:  14 minutes, 48 seconds (584 mGy) ACCESS: Right common femoral artery; hemostasis achieved with manual compression. COMPLICATIONS: None immediate. TECHNIQUE: Informed written consent was obtained from the patient after a discussion of the risks, benefits and alternatives to treatment. Questions regarding the procedure were encouraged and answered. A timeout was performed prior to the initiation of the procedure. The right groin was prepped and draped in the usual sterile fashion, and a sterile drape was applied covering the operative field. Maximum barrier sterile technique with sterile gowns and gloves were used for the procedure. A timeout was performed prior to the  initiation of the procedure. Local anesthesia was provided with 1% lidocaine. The right femoral head was marked fluoroscopically. Under ultrasound guidance, the right common femoral artery was accessed with a micropuncture kit after the overlying soft tissues were anesthetized with 1% lidocaine. An ultrasound image was saved for documentation purposes. The micropuncture sheath was exchanged for a 5 [place] sheath over a Bentson wire. A closure arteriogram was performed through the side of the sheath confirming access within the right common femoral artery. Over a Bentson wire, a [employer]  catheter was advanced to the level of the thoracic aorta where it was back bled and flushed. The catheter was then utilized to select the superior mesenteric artery and a superior mesenteric arteriogram with delayed portal venous imaging was performed. The Mickelson catheter was then advanced cranially and utilized to select the celiac artery and a celiac arteriogram was performed. Utilizing a fathom 14 microwire, a regular renegade micro catheter was utilized to select common and proper hepatic arteries and selective arteriograms were performed. The Renegade microcatheter was utilized to select an accessory pancreaticoduodenal artery noted to arise subjacent to the left hepatic artery. Selective arteriogram was performed confirming appropriate positioning and the vessel was subsequent percutaneously coil embolized with 3 overlapping 2 mm x 5 mm partial coils to near the vessel's origin. The microcatheter was retracted into the proper hepatic artery and a post embolization proper hepatic arteriogram was performed Next, the microcatheter was utilized to select the accessory right hepatic artery and accessory right hepatic arteriogram was performed. From this location, 2 mCi of technetium 99 MAA was administered. Next the microcatheter was advanced into the right hepatic artery and a selective right hepatic arteriogram was  performed. From the location proximal to the vessels bifurcation, 3 mCi of technetium 99 MAA was administered. At this point, the procedure was terminated. All wires and catheters and sheaths were removed from the patient. Hemostasis was achieved at the right groin and access site with manual compression. A dressing was placed. The patient tolerated procedure well without immediate postprocedural complication. The patient was escorted to nuclear medicine department for planar imaging. FINDINGS: Selective superior mesenteric arteriogram was again negative for accessory or replaced hepatic arterial supply. Late portal venous phase imaging demonstrates patency of the main, right and left portal veins. Celiac arteriogram re-demonstrates an accessory right hepatic artery supplying the medial aspect of the right lobe of the liver. 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 Successful split dose administration of a total of 5 mCi of technetium 46mMAA via the right hepatic and accessory right hepatic arteries. IMPRESSION: 1. Successful pre Y-90 arteriogram with percutaneous coil embolization of the pancreaticoduodenal artery. 2. Successful split dose administration of a total of 5 mCi of technetium 944mAA via the right hepatic and accessory right hepatic arteries. Awaiting results of nuclear medicine liver scan. PLAN: Pending the results of the nuclear medicine liver scan, the patient will return Y90 radioembolization the right hepatic artery. Pending the patient's recovery from this initial treatment session, the patient will return for radioembolization of the accessory right hepatic artery. Electronically Signed   By: JoSandi Mariscal.D.   On: 12/08/2017 13:45   Ir [place] Vasc Access Right  Result Date: 12/08/2017 INDICATION: History of multifocal hepatocellular  carcinoma. Patient presents today for Y 90 mapping procedure. Please refer to formal consultation in the epic EMR dated 11/17/2017 for additional details. EXAM: 1. ULTRASOUND GUIDANCE FOR ARTERIAL ACCESS 2. CELIAC AND SUPERIOR MESENTERIC ARTERIOGRAM (1st ORDER) 3. SELECTIVE COMMON AND PROPER HEPATIC ARTERIOGRAMS 4. SELECTIVE ARTERIOGRAM OF ACCESSORY PANCREATICODUODENAL ARTERY AND PERCUTANEOUS COIL EMBOLIZATION 5. SELECTIVE ARTERIOGRAM OF ACCESSORY RIGHT HEPATIC ARTERY AND ADMINISTRATION OF Tc9970mA 6. SELECTIVE ARTERIOGRAM OF THE RIGHT HEPATIC ARTERY AND ADMINISTRATION OF Tc99m82m COMPARISON:  Hepatic bland embolization - 07/25/2017; abdominal MRI - 11/01/2017; CTA of the abdomen pelvis - 07/07/2017 MEDICATIONS: None RADIOPHARMACEUTICALS:  A total of 5  mCi Tc39mMAA was administered, 2 mCi via an accessory right hepatic artery and 3 mCi via the right hepatic artery. CONTRAST:  75 cc Isovue 300 ANESTHESIA/SEDATION: Moderate (conscious) sedation was employed during this procedure. A total of Versed 3 mg and Fentanyl 150 mcg was administered intravenously. Moderate Sedation Time: 59 minutes. The patient's level of consciousness and vital signs were monitored continuously by radiology nursing throughout the procedure under my direct supervision. FLUOROSCOPY TIME:  14 minutes, 48 seconds (584 mGy) ACCESS: Right common femoral artery; hemostasis achieved with manual compression. COMPLICATIONS: None immediate. TECHNIQUE: Informed written consent was obtained from the patient after a discussion of the risks, benefits and alternatives to treatment. Questions regarding the procedure were encouraged and answered. A timeout was performed prior to the initiation of the procedure. The right groin was prepped and draped in the usual sterile fashion, and a sterile drape was applied covering the operative field. Maximum barrier sterile technique with sterile gowns and gloves were used for the procedure. A timeout was performed  prior to the initiation of the procedure. Local anesthesia was provided with 1% lidocaine. The right femoral head was marked fluoroscopically. Under ultrasound guidance, the right common femoral artery was accessed with a micropuncture kit after the overlying soft tissues were anesthetized with 1% lidocaine. An ultrasound image was saved for documentation purposes. The micropuncture sheath was exchanged for a 5 [place] sheath over a Bentson wire. A closure arteriogram was performed through the side of the sheath confirming access within the right common femoral artery. Over a Bentson wire, a Mickelson catheter was advanced to the level of the thoracic aorta where it was back bled and flushed. The catheter was then utilized to select the superior mesenteric artery and a superior mesenteric arteriogram with delayed portal venous imaging was performed. The Mickelson catheter was then advanced cranially and utilized to select the celiac artery and a celiac arteriogram was performed. Utilizing a fathom 14 microwire, a regular renegade micro catheter was utilized to select common and proper hepatic arteries and selective arteriograms were performed. The Renegade microcatheter was utilized to select an accessory pancreaticoduodenal artery noted to arise subjacent to the left hepatic artery. Selective arteriogram was performed confirming appropriate positioning and the vessel was subsequent percutaneously coil embolized with 3 overlapping 2 mm x 5 mm partial coils to near the vessel's origin. The microcatheter was retracted into the proper hepatic artery and a post embolization proper hepatic arteriogram was performed Next, the microcatheter was utilized to select the accessory right hepatic artery and accessory right hepatic arteriogram was performed. From this location, 2 mCi of technetium 99 MAA was administered. Next the microcatheter was advanced into the right hepatic artery and a selective right hepatic  arteriogram was performed. From the location proximal to the vessels bifurcation, 3 mCi of technetium 99 MAA was administered. At this point, the procedure was terminated. All wires and catheters and sheaths were removed from the patient. Hemostasis was achieved at the right groin and access site with manual compression. A dressing was placed. The patient tolerated procedure well without immediate postprocedural complication. The patient was escorted to nuclear medicine department for planar imaging. FINDINGS: Selective superior mesenteric arteriogram was again negative for accessory or replaced hepatic arterial supply. Late portal venous phase imaging demonstrates patency of the main, right and left portal veins. Celiac arteriogram re-demonstrates an accessory right hepatic artery supplying the medial aspect of the right lobe of the liver. An accessory pancreaticoduodenal artery was again noted to arise  subjacent to the takeoff of the left hepatic artery and was subsequently selected and coil embolized to near the vessel's origin. Successful split dose administration of a total of 5 mCi of technetium 33mMAA via the right hepatic and accessory right hepatic arteries. IMPRESSION: 1. Successful pre Y-90 arteriogram with percutaneous coil embolization of the pancreaticoduodenal artery. 2. Successful split dose administration of a total of 5 mCi of technetium 932mAA via the right hepatic and accessory right hepatic arteries. Awaiting results of nuclear medicine liver scan. PLAN: Pending the results of the nuclear medicine liver scan, the patient will return Y90 radioembolization the right hepatic artery. Pending the patient's recovery from this initial treatment session, the patient will return for radioembolization of the accessory right hepatic artery. Electronically Signed   By: JoSandi Mariscal.D.   On: 12/08/2017 13:45   Ir Embo Arterial Not [place] Roadmapping  Result Date:  12/08/2017 INDICATION: History of multifocal hepatocellular carcinoma. Patient presents today for Y 90 mapping procedure. Please refer to formal consultation in the epic EMR dated 11/17/2017 for additional details. EXAM: 1. ULTRASOUND GUIDANCE FOR ARTERIAL ACCESS 2. CELIAC AND SUPERIOR MESENTERIC ARTERIOGRAM (1st ORDER) 3. SELECTIVE COMMON AND PROPER HEPATIC ARTERIOGRAMS 4. SELECTIVE ARTERIOGRAM OF ACCESSORY PANCREATICODUODENAL ARTERY AND PERCUTANEOUS COIL EMBOLIZATION 5. SELECTIVE ARTERIOGRAM OF ACCESSORY RIGHT HEPATIC ARTERY AND ADMINISTRATION OF Tc9966mA 6. SELECTIVE ARTERIOGRAM OF THE RIGHT HEPATIC ARTERY AND ADMINISTRATION OF Tc99m69m COMPARISON:  Hepatic bland embolization - 07/25/2017; abdominal MRI - 11/01/2017; CTA of the abdomen pelvis - 07/07/2017 MEDICATIONS: None RADIOPHARMACEUTICALS:  A total of 5 mCi Tc99m 76mwas administered, 2 mCi via an accessory right hepatic artery and 3 mCi via the right hepatic artery. CONTRAST:  75 cc Isovue 300 ANESTHESIA/SEDATION: Moderate (conscious) sedation was employed during this procedure. A total of Versed 3 mg and Fentanyl 150 mcg was administered intravenously. Moderate Sedation Time: 59 minutes. The patient's level of consciousness and vital signs were monitored continuously by radiology nursing throughout the procedure under my direct supervision. FLUOROSCOPY TIME:  14 minutes, 48 seconds (584 mGy) ACCESS: Right common femoral artery; hemostasis achieved with manual compression. COMPLICATIONS: None immediate. TECHNIQUE: Informed written consent was obtained from the patient after a discussion of the risks, benefits and alternatives to treatment. Questions regarding the procedure were encouraged and answered. A timeout was performed prior to the initiation of the procedure. The right groin was prepped and draped in the usual sterile fashion, and a sterile drape was applied covering the operative field. Maximum barrier sterile technique with sterile gowns and  gloves were used for the procedure. A timeout was performed prior to the initiation of the procedure. Local anesthesia was provided with 1% lidocaine. The right femoral head was marked fluoroscopically. Under ultrasound guidance, the right common femoral artery was accessed with a micropuncture kit after the overlying soft tissues were anesthetized with 1% lidocaine. An ultrasound image was saved for documentation purposes. The micropuncture sheath was exchanged for a 5 [place] sheath over a Bentson wire. A closure arteriogram was performed through the side of the sheath confirming access within the right common femoral artery. Over a Bentson wire, a Mickelson catheter was advanced to the level of the thoracic aorta where it was back bled and flushed. The catheter was then utilized to select the superior mesenteric artery and a superior mesenteric arteriogram with delayed portal venous imaging was performed. The Mickelson catheter was then advanced cranially and utilized to select the celiac artery and a celiac  arteriogram was performed. Utilizing a fathom 14 microwire, a regular renegade micro catheter was utilized to select common and proper hepatic arteries and selective arteriograms were performed. The Renegade microcatheter was utilized to select an accessory pancreaticoduodenal artery noted to arise subjacent to the left hepatic artery. Selective arteriogram was performed confirming appropriate positioning and the vessel was subsequent percutaneously coil embolized with 3 overlapping 2 mm x 5 mm partial coils to near the vessel's origin. The microcatheter was retracted into the proper hepatic artery and a post embolization proper hepatic arteriogram was performed Next, the microcatheter was utilized to select the accessory right hepatic artery and accessory right hepatic arteriogram was performed. From this location, 2 mCi of technetium 99 MAA was administered. Next the microcatheter was advanced into  the right hepatic artery and a selective right hepatic arteriogram was performed. From the location proximal to the vessels bifurcation, 3 mCi of technetium 99 MAA was administered. At this point, the procedure was terminated. All wires and catheters and sheaths were removed from the patient. Hemostasis was achieved at the right groin and access site with manual compression. A dressing was placed. The patient tolerated procedure well without immediate postprocedural complication. The patient was escorted to nuclear medicine department for planar imaging. FINDINGS: Selective superior mesenteric arteriogram was again negative for accessory or replaced hepatic arterial supply. Late portal venous phase imaging demonstrates patency of the main, right and left portal veins. Celiac arteriogram re-demonstrates an accessory right hepatic artery supplying the medial aspect of the right lobe of the liver. 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 Successful split dose administration of a total of 5 mCi of technetium 34mMAA via the right hepatic and accessory right hepatic arteries. IMPRESSION: 1. Successful pre Y-90 arteriogram with percutaneous coil embolization of the pancreaticoduodenal artery. 2. Successful split dose administration of a total of 5 mCi of technetium 919mAA via the right hepatic and accessory right hepatic arteries. Awaiting results of nuclear medicine liver scan. PLAN: Pending the results of the nuclear medicine liver scan, the patient will return Y90 radioembolization the right hepatic artery. Pending the patient's recovery from this initial treatment session, the patient will return for radioembolization of the accessory right hepatic artery. Electronically Signed   By: JoSandi Mariscal.D.   On: 12/08/2017 13:45   Ct Angio Abd/pel W/ And/or  W/o  Result Date: 12/08/2017 CLINICAL DATA:  Mapping Y 90 radioembolization earlier today, now with right lower abdominal pain. Evaluate for hematoma/access site complication. EXAM: CTA ABDOMEN AND PELVIS WITH CONTRAST TECHNIQUE: Multidetector CT imaging of the abdomen and pelvis was performed using the standard protocol during bolus administration of intravenous contrast. Multiplanar reconstructed images and MIPs were obtained and reviewed to evaluate the vascular anatomy. CONTRAST:  [id number] IOPAMIDOL (ISOVUE-370) INJECTION 76% COMPARISON:  Abdominal MRI-11/01/2017; CT abdomen pelvis - 07/07/2017 FINDINGS: VASCULAR Aorta: Scattered mixed calcified and noncalcified atherosclerotic plaque with a normal caliber abdominal aorta, not resulting in hemodynamically significant stenosis. Celiac: There is a minimal amount of atherosclerotic plaque involving the origin the celiac artery, not resulting in hemodynamically significant stenosis. Sequela of coil embolization the pancreaticoduodenal artery. SMA: There is a moderate amount of mixed calcified and noncalcified atherosclerotic plaque involving the origin of the SMA which approaches of approximately 50% luminal narrowing. Renals: Solitary bilaterally. There is a minimal amount of calcified atherosclerotic plaque involving the origin  of left renal artery, not resulting in a hemodynamically significant stenosis. No vessel irregularity to suggest FMD. IMA: Remains widely patent. Pelvic inflow: There is a minimal amount of calcified atherosclerotic plaque involving the bilateral common iliac arteries, not resulting in a hemodynamically significant stenosis. The bilateral common iliac arteries are diseased though of normal caliber. The bilateral external iliac arteries are of normal caliber and widely patent. Right-sided proximal outflow: There is a minimal amount of mixed calcified and noncalcified atherosclerotic plaque within the right common femoral artery.  There is a tiny area of active extravasation at the location of the right common femoral artery access site (representative images 23 through 25) with associated hematoma tracking into the right lower pelvis and abdomen. No definitive retroperitoneal hematoma. The adjacent inferior epigastric artery appears widely patent as does the right deep iliac circumflex artery. No vessel dissection. The imaged portions of the right superficial and deep femoral artery appear widely patent. Left-sided proximal outflow: The left common femoral artery is widely patent without hemodynamically significant stenosis. There is a minimal amount of calcified atherosclerotic plaque involving the origin of the left superficial femoral artery, not resulting in hemodynamically significant stenosis. The left deep femoral artery is widely patent throughout its imaged course. Veins: The pelvic venous system and IVC appear widely patent. Review of the MIP images confirms the above findings. NON-VASCULAR Lower chest: Limited visualization of the lower thorax is negative for focal airspace opacity or pleural effusion. Coronary artery calcifications.  No pericardial effusion. 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 Re demonstrated ill-defined area of hyperenhancement adjacent to the ablation zone within the anterior segment of the right lobe of the liver (image 23, series 2 as well as nodular areas of enhancement within the right lobe of the liver (images 21, 24 and 34) compatible with known multifocal hepatocellular carcinoma. The portal vein appears widely patent. Normal appearance of the gallbladder.  No radiopaque gallstones. Pancreas: Normal appearance of the pancreas Spleen: Normal appearance of the spleen Adrenals/Urinary Tract: Excreted contrast from a prior arteriogram seen within bilateral renal collecting system. No definite renal stones. No discrete renal lesions. No urinary obstruction or  perinephric stranding. Excreted contrast seen within the urinary bladder. Stomach/Bowel: There is minimal mass effect upon the cecum a distal sigmoid colon secondary to the right lower abdominal hematoma. The bowel is otherwise normal in course and caliber without wall thickening or evidence of enteric obstruction. No pneumoperitoneum, pneumatosis or portal venous gas. Lymphatic: No bulky porta hepatis, retroperitoneal, mesenteric, pelvic or inguinal lymphadenopathy. Reproductive: Dystrophic calcifications within normal sized prostate gland. No free fluid in the pelvic cul-de-sac. Other: Minimal amount of expected stranding within the right groin regional to the access site. Musculoskeletal: No acute or aggressive osseous abnormalities. Stigmata of DISH within the lower thoracic spine. Mild-to-moderate multilevel lumbar spine DDD, worse at a definitive 3 and L3-L4 with disc space height loss, endplate irregularity and sclerosis. Bilateral facet degenerative change within the lower lumbar spine. Bone island is noted involving the posterior aspect the right ilium, unchanged. IMPRESSION: VASCULAR 1. Tiny area of active extravasation from the right common femoral artery access site with associated hematoma extending from the right pelvis to the right lower abdomen. Patient was subsequently brought to the interventional radiology suite and approximately 30 additional minutes of manual compression was applied. 2.  Aortic Atherosclerosis (ICD10-I70.0). NON-VASCULAR 1. Otherwise, no acute findings within the abdomen or pelvis. 2. Similar findings of hepatic cirrhosis and multifocal hepatocellular carcinoma as seen on  recent abdominal MRI. Electronically Signed   By: Sandi Mariscal M.D.   On: 12/08/2017 16:53   Nm Fusion  Result Date: 12/08/2017 CLINICAL DATA:  Pre yttrium 90 radioembolization evaluation. EXAM: NUCLEAR MEDICINE LIVER SCAN; ULTRASOUND MISCELLANEOUS SOFT TISSUE TECHNIQUE: Abdominal images were obtained in  multiple projections after intrahepatic arterial injection of radiopharmaceutical. SPECT imaging was performed. Lung shunt calculation was performed. RADIOPHARMACEUTICALS:  4.68mllicurie MAA TECHNETIUM TO 71M ALBUMIN AGGREGATED COMPARISON:  MRI 06/28/2017, angiography 12/08/2017 FINDINGS: The injected microaggregated albumin localizes within the RIGHT hepatic lobe. Small focus of activity in the porta hepatis. No evidence of activity within the stomach, duodenum, or bowel. Calculated shunt fraction to the lungs equals 8.0%. IMPRESSION: 1. Injected MAA tracer activity localizes to the RIGHT hepatic lobe. 2. Small focus of radiotracer activity within the porta hepatis. Recommend catheter angiography evaluation at time of treatment. 3. Lung shunt fraction equals 8.0%. Findings conveyed toJOHN WATTS on 12/08/2017  at16:08. Electronically Signed   By: SSuzy BouchardM.D.   On: 12/08/2017 16:09   EKG: No EKG done in Radiology Suite  Assessment/Plan Principal Problem:   Hematoma Active Problems:   Hepatocellular carcinoma (HCC)   Alcoholic cirrhosis of liver without ascites (HCC)  Acute Right Lower Quadrant abdominal pain post procedure with resultant hematoma and extravasation of some blood -Placed the patient in telemetry obs -Obtain a stat hemoglobin/hematocrit and then every 6 hours.  Obtain a CBC at 8 PM tonight and then repeat CBC in the a.m. -Pain control with IV Dilaudid 0.5 mg every 4 PRN for moderate pain along with tramadol 50 mg p.o. every 6 as needed for moderate and severe pain -Patient's hemoglobin/hematocrit was 14.4/41.1 and likely expected to drop secondary to his hematoma -Patient is to have bedrest for at least 4 hours post procedurally and keeping his right leg flat -If stable in the a.m. can likely be discharged pending our evaluation in the morning  History of alcoholic liver cirrhosis complicated with esophageal varices, portal venous hypertension, and Multifocal hepatocellular  carcinoma -Continue with home medications including propranolol 20 mg p.o. twice daily for varices -Continue rifaximin 550 mg p.o. twice daily for liver cirrhosis -Pain control as above  Hepatocellular carcinoma -As above patient underwent Y 90 roadmapping with embolization of the pancreaticoduodenal artery -IR will evaluate in the morning  Thrombocytopenia -Chronic in the setting of liver cirrhosis.  Platelet count was 83 -Continue to monitor for signs and symptoms of bleeding and repeat CBC in a.m.  Hyperbilirubinemia -Patient's T bili was 1.6 and likely in the setting of alcoholic liver cirrhosis as well as hepatocellular carcinoma -Repeat CMP in the  DVT prophylaxis: SCDs Code Status: Full Code Family Communication: Discussed with family at bedside Disposition Plan: Anticipate discharge home in a.m. Consults called: IR Dr. WPascal LuxAdmission status: Obs Telemetry  Severity of Illness: The appropriate patient status for this patient is OBSERVATION. Observation status is judged to be reasonable and necessary in order to provide the required intensity of service to ensure the patient's safety. The patient's presenting symptoms, physical exam findings, and initial radiographic and laboratory data in the context of their medical condition is felt to place them at decreased risk for further clinical deterioration. Furthermore, it is anticipated that the patient will be medically stable for discharge from the hospital within 2 midnights of admission. The following factors support the patient status of observation.   " The patient's presenting symptoms include Abdominal Pain. " The physical exam findings include Tender RLQ. " The initial radiographic  and laboratory data show hematoma. Labs are currently reassuring but expect Hb to drop.   Kerney Elbe, D.O. [facility] Pager [phone]  If 7PM-7AM, please contact night-coverage [email or web address] Password [facility]  12/08/2017,  5:45 PM

## 2017-12-08 NOTE — Progress Notes (Signed)
Patient ID: KHASIR WOODROME, male   DOB: 04/16/1941, 77 y.o.   MRN: [id number]  CT of the abdomen and pelvis demonstrated a tiny area of active extravasation from the right common femoral artery access site with associated hematoma extending from the right pelvis to the right lower abdomen.    Patient was subsequently brought to the interventional radiology suite and approximately 30 additional minutes of manual compression was applied.   Dressing changed and compression dressing applied.  Patient has remain hemodynamically stable throughout his prolonged recovery.  Recommend and additional 4 hrs of bed rest (until 2100) with right leg straight. Have placed indwelling catheter for the evening.   Appreciate [facility]'s willingness to admit and assist with management.  Hope for d/c in AM.  Ronny Bacon, MD Pager #: [phone]

## 2017-12-08 NOTE — Procedures (Signed)
Pre-procedure Diagnosis: Multifocal HCC Post-procedure Diagnosis: Same  Post mapping Y-90 with coil embolization of pancreaticoduodenal artery.    Complications: None Immediate  EBL: None  Keep right leg straight for 4 hrs.    SignedSandi Mariscal Pager: [phone] 12/08/2017, 12:03 PM

## 2017-12-08 NOTE — Progress Notes (Signed)
Pt transferred to room 1602; report given to nurse Josph Macho.

## 2017-12-08 NOTE — Progress Notes (Signed)
PA arrived at bedside to examine patient.  Pain medications ordered and PA consulted with Radiologist.  Dr Pascal Lux arrived at bedside.

## 2017-12-08 NOTE — Sedation Documentation (Signed)
Patient is resting comfortably with eyes closed. Responds "doing just fine" when asked if he's okay. NAD noted.

## 2017-12-08 NOTE — Sedation Documentation (Signed)
Transferred back to [facility] at this time

## 2017-12-09 ENCOUNTER — Encounter (HOSPITAL_COMMUNITY): Payer: Self-pay | Admitting: Interventional Radiology

## 2017-12-09 ENCOUNTER — Observation Stay (HOSPITAL_COMMUNITY): Payer: No Typology Code available for payment source

## 2017-12-09 DIAGNOSIS — Z87891 Personal history of nicotine dependence: Secondary | ICD-10-CM | POA: Diagnosis not present

## 2017-12-09 DIAGNOSIS — H9193 Unspecified hearing loss, bilateral: Secondary | ICD-10-CM | POA: Diagnosis present

## 2017-12-09 DIAGNOSIS — K91870 Postprocedural hematoma of a digestive system organ or structure following a digestive system procedure: Secondary | ICD-10-CM | POA: Diagnosis present

## 2017-12-09 DIAGNOSIS — C220 Liver cell carcinoma: Secondary | ICD-10-CM | POA: Diagnosis not present

## 2017-12-09 DIAGNOSIS — T148XXA Other injury of unspecified body region, initial encounter: Secondary | ICD-10-CM | POA: Diagnosis not present

## 2017-12-09 DIAGNOSIS — R55 Syncope and collapse: Secondary | ICD-10-CM

## 2017-12-09 DIAGNOSIS — Z881 Allergy status to other antibiotic agents status: Secondary | ICD-10-CM | POA: Diagnosis not present

## 2017-12-09 DIAGNOSIS — I2510 Atherosclerotic heart disease of native coronary artery without angina pectoris: Secondary | ICD-10-CM | POA: Diagnosis present

## 2017-12-09 DIAGNOSIS — K7030 Alcoholic cirrhosis of liver without ascites: Secondary | ICD-10-CM | POA: Diagnosis not present

## 2017-12-09 DIAGNOSIS — I8510 Secondary esophageal varices without bleeding: Secondary | ICD-10-CM | POA: Diagnosis present

## 2017-12-09 DIAGNOSIS — D62 Acute posthemorrhagic anemia: Secondary | ICD-10-CM | POA: Diagnosis present

## 2017-12-09 DIAGNOSIS — I340 Nonrheumatic mitral (valve) insufficiency: Secondary | ICD-10-CM | POA: Diagnosis not present

## 2017-12-09 DIAGNOSIS — M5136 Other intervertebral disc degeneration, lumbar region: Secondary | ICD-10-CM | POA: Diagnosis present

## 2017-12-09 DIAGNOSIS — I700 Atherosclerosis of aorta: Secondary | ICD-10-CM | POA: Diagnosis present

## 2017-12-09 DIAGNOSIS — Y838 Other surgical procedures as the cause of abnormal reaction of the patient, or of later complication, without mention of misadventure at the time of the procedure: Secondary | ICD-10-CM | POA: Diagnosis present

## 2017-12-09 DIAGNOSIS — M1990 Unspecified osteoarthritis, unspecified site: Secondary | ICD-10-CM | POA: Diagnosis present

## 2017-12-09 DIAGNOSIS — I10 Essential (primary) hypertension: Secondary | ICD-10-CM | POA: Diagnosis present

## 2017-12-09 DIAGNOSIS — H9190 Unspecified hearing loss, unspecified ear: Secondary | ICD-10-CM | POA: Diagnosis present

## 2017-12-09 DIAGNOSIS — D696 Thrombocytopenia, unspecified: Secondary | ICD-10-CM | POA: Diagnosis present

## 2017-12-09 DIAGNOSIS — Z79899 Other long term (current) drug therapy: Secondary | ICD-10-CM | POA: Diagnosis not present

## 2017-12-09 LAB — ECHOCARDIOGRAM COMPLETE
Height: 65 in
WEIGHTICAEL: 2218.71 [oz_av]

## 2017-12-09 LAB — COMPREHENSIVE METABOLIC PANEL
ALK PHOS: 91 U/L (ref 38–126)
ALT: 31 U/L (ref 17–63)
AST: 43 U/L — ABNORMAL HIGH (ref 15–41)
Albumin: 4 g/dL (ref 3.5–5.0)
Anion gap: 14 (ref 5–15)
BILIRUBIN TOTAL: 1.8 mg/dL — AB (ref 0.3–1.2)
BUN: 13 mg/dL (ref 6–20)
CALCIUM: 9 mg/dL (ref 8.9–10.3)
CO2: 21 mmol/L — ABNORMAL LOW (ref 22–32)
CREATININE: 0.88 mg/dL (ref 0.61–1.24)
Chloride: 104 mmol/L (ref 101–111)
GFR calc Af Amer: >60 mL/min (ref >60)
Glucose, Bld: 95 mg/dL (ref 65–99)
Potassium: 4.3 mmol/L (ref 3.5–5.1)
Sodium: 139 mmol/L (ref 135–145)
TOTAL PROTEIN: 6.6 g/dL (ref 6.5–8.1)

## 2017-12-09 LAB — CBC
HEMATOCRIT: 31.7 % — AB (ref 39.0–52.0)
HEMOGLOBIN: 11 g/dL — AB (ref 13.0–17.0)
MCH: 32.9 pg (ref 26.0–34.0)
MCHC: 34.7 g/dL (ref 30.0–36.0)
MCV: 94.9 fL (ref 78.0–100.0)
PLATELETS: 62 10*3/uL — AB (ref 150–400)
RBC: 3.34 MIL/uL — AB (ref 4.22–5.81)
RDW: 13.9 % (ref 11.5–15.5)
WBC: 6.6 10*3/uL (ref 4.0–10.5)

## 2017-12-09 LAB — CBC WITH DIFFERENTIAL/PLATELET
BASOS ABS: 0 10*3/uL (ref 0.0–0.1)
Basophils Absolute: 0 10*3/uL (ref 0.0–0.1)
Basophils Relative: 0 %
Basophils Relative: 0 %
EOS PCT: 5 %
Eosinophils Absolute: 0.3 10*3/uL (ref 0.0–0.7)
Eosinophils Absolute: 0.3 10*3/uL (ref 0.0–0.7)
Eosinophils Relative: 5 %
HCT: 28.9 % — ABNORMAL LOW (ref 39.0–52.0)
HEMATOCRIT: 28.4 % — AB (ref 39.0–52.0)
Hemoglobin: 10 g/dL — ABNORMAL LOW (ref 13.0–17.0)
Hemoglobin: 9.6 g/dL — ABNORMAL LOW (ref 13.0–17.0)
LYMPHS PCT: 20 %
LYMPHS PCT: 17 %
Lymphs Abs: 1.4 10*3/uL (ref 0.7–4.0)
Lymphs Abs: 1 10*3/uL (ref 0.7–4.0)
MCH: 33.1 pg (ref 26.0–34.0)
MCH: 32.3 pg (ref 26.0–34.0)
MCHC: 34.6 g/dL (ref 30.0–36.0)
MCHC: 33.8 g/dL (ref 30.0–36.0)
MCV: 95.7 fL (ref 78.0–100.0)
MCV: 95.6 fL (ref 78.0–100.0)
MONO ABS: 2.2 10*3/uL — AB (ref 0.1–1.0)
MONOS PCT: 37 %
Monocytes Absolute: 2.6 10*3/uL — ABNORMAL HIGH (ref 0.1–1.0)
Monocytes Relative: 37 %
NEUTROS ABS: 2.5 10*3/uL (ref 1.7–7.7)
NEUTROS PCT: 38 %
Neutro Abs: 2.7 10*3/uL (ref 1.7–7.7)
Neutrophils Relative %: 41 %
PLATELETS: 52 10*3/uL — AB (ref 150–400)
Platelets: 59 10*3/uL — ABNORMAL LOW (ref 150–400)
RBC: 3.02 MIL/uL — ABNORMAL LOW (ref 4.22–5.81)
RBC: 2.97 MIL/uL — ABNORMAL LOW (ref 4.22–5.81)
RDW: 13.7 % (ref 11.5–15.5)
RDW: 13.7 % (ref 11.5–15.5)
WBC: 7 10*3/uL (ref 4.0–10.5)
WBC: 6.1 10*3/uL (ref 4.0–10.5)

## 2017-12-09 LAB — TROPONIN I: Troponin I: <0.03 ng/mL (ref <0.03)

## 2017-12-09 LAB — GLUCOSE, CAPILLARY
GLUCOSE-CAPILLARY: 88 mg/dL (ref 65–99)
Glucose-Capillary: 76 mg/dL (ref 65–99)

## 2017-12-09 LAB — TSH: TSH: 0.548 u[IU]/mL (ref 0.350–4.500)

## 2017-12-09 MED ORDER — TAMSULOSIN HCL 0.4 MG PO CAPS
0.4000 mg | ORAL_CAPSULE | Freq: Every day | ORAL | Status: DC
  Administered 2017-12-09: 0.4 mg via ORAL
  Filled 2017-12-09: qty 1

## 2017-12-09 MED ORDER — SODIUM CHLORIDE 0.9 % IV BOLUS
250.0000 mL | Freq: Once | INTRAVENOUS | Status: AC
  Administered 2017-12-09: 250 mL via INTRAVENOUS

## 2017-12-09 MED ORDER — POLYETHYLENE GLYCOL 3350 17 G PO PACK
17.0000 g | PACK | Freq: Two times a day (BID) | ORAL | Status: DC
  Administered 2017-12-09 – 2017-12-10 (×2): 17 g via ORAL
  Filled 2017-12-09 (×3): qty 1

## 2017-12-09 MED ORDER — SENNOSIDES-DOCUSATE SODIUM 8.6-50 MG PO TABS
1.0000 | ORAL_TABLET | Freq: Two times a day (BID) | ORAL | Status: DC
  Administered 2017-12-09 (×2): 1 via ORAL
  Filled 2017-12-09 (×3): qty 1

## 2017-12-09 NOTE — Progress Notes (Signed)
Patient went to the bathroom and seemed to vagal down while having bowel movement. RRT called. Patient was unresponsive and diaphoretic with increased work of breathing while in bathroom on commode. No pulse palpated, code blue was called. Patient became more alert but unable to verablize upon getting back into bed and laying flat. VS obtained. Patient able to respond after a few minutes. Patient educated on the importance of not bearing down when trying to have bowel movement.

## 2017-12-09 NOTE — Evaluation (Signed)
Physical Therapy Evaluation-1x Patient Details Name: Mike Ferguson MRN: [id number] DOB: 11/13/1940 Today's Date: 12/09/2017   History of Present Illness  77 yo male admitted with a hematoma. s/p IR angiogram 12/08/17. Pt experienced increased pain and also had a vasovagal response. He was kept overnight for observation. Hx of ETOH cirrhosis, hepatocellular carcinoma  Clinical Impression  On eval, pt was supervision level assist for mobility. He walked ~400 feet without any difficulty or LOB. Pt denied lightheadedness or weakness. He c/o mild discomfort and procedure site. No acute PT needs. 1x eval. Will sign off.     Follow Up Recommendations No PT follow up    Equipment Recommendations  None recommended by PT    Recommendations for Other Services       Precautions / Restrictions Precautions Precautions: None Restrictions Weight Bearing Restrictions: No      Mobility  Bed Mobility Overal bed mobility: Modified Independent                Transfers Overall transfer level: Modified independent                  Ambulation/Gait Ambulation/Gait assistance: Supervision Ambulation Distance (Feet): 400 Feet Assistive device: None Gait Pattern/deviations: Step-through pattern     General Gait Details: Pt denied dizziness. No LOB  Stairs            Wheelchair Mobility    Modified Rankin (Stroke Patients Only)       Balance Overall balance assessment: No apparent balance deficits (not formally assessed)                                           Pertinent Vitals/Pain Pain Assessment: Faces Faces Pain Scale: Hurts little more Pain Location: R thigh Pain Descriptors / Indicators: Operative site guarding Pain Intervention(s): Monitored during session    Home Living Family/patient expects to be discharged to:: Private residence Living Arrangements: Spouse/significant other Available Help at Discharge: Family Type of Home:  House Home Access: Stairs to enter Entrance Stairs-Rails: None Technical brewer of Steps: 2 Home Layout: One level Home Equipment: Cane - single point      Prior Function Level of Independence: Independent               Hand Dominance        Extremity/Trunk Assessment   Upper Extremity Assessment Upper Extremity Assessment: Overall WFL for tasks assessed    Lower Extremity Assessment Lower Extremity Assessment: Overall WFL for tasks assessed    Cervical / Trunk Assessment Cervical / Trunk Assessment: Normal  Communication   Communication: No difficulties  Cognition Arousal/Alertness: Awake/alert Behavior During Therapy: WFL for tasks assessed/performed Overall Cognitive Status: Within Functional Limits for tasks assessed                                        General Comments      Exercises     Assessment/Plan    PT Assessment Patent does not need any further PT services  PT Problem List         PT Treatment Interventions      PT Goals (Current goals can be found in the Care Plan section)  Acute Rehab PT Goals Patient Stated Goal: home PT Goal Formulation: All assessment and education complete, DC  therapy    Frequency     Barriers to discharge        Co-evaluation               AM-PAC PT "6 Clicks" Daily Activity  Outcome Measure Difficulty turning over in bed (including adjusting bedclothes, sheets and blankets)?: None Difficulty moving from lying on back to sitting on the side of the bed? : None Difficulty sitting down on and standing up from a chair with arms (e.g., wheelchair, bedside commode, etc,.)?: None Help needed moving to and from a bed to chair (including a wheelchair)?: None Help needed walking in hospital room?: None Help needed climbing 3-5 steps with a railing? : A Little 6 Click Score: 23    End of Session Equipment Utilized During Treatment: Gait belt Activity Tolerance: Patient tolerated  treatment well Patient left: in bed;with call bell/phone within reach;with family/visitor present        Time: 1352-1403 PT Time Calculation (min) (ACUTE ONLY): 11 min   Charges:   PT Evaluation $PT Eval Moderate Complexity: 1 Mod     PT G Codes:         Weston Anna, MPT Pager: [phone]

## 2017-12-09 NOTE — Progress Notes (Signed)
Assumed care of this patient from prior RN. Agree with previous assessment. Will continue to monitor patient closely.  

## 2017-12-09 NOTE — Progress Notes (Signed)
Referring Physician(s): [name],V  Supervising Physician: Corrie Mckusick  Patient Status:  [facility] - In-pt  Chief Complaint: Multifocal hepatocellular carcinoma, right pelvic/retroperitoneal hematoma   Subjective: Patient doing okay this a.m.  Overnight events noted.  Patient appeared to have had a vagal response during bowel movement last night.  Currently alert and oriented, denies abdominal pain, nausea, vomiting.  Did have some discomfort with Foley placement which has subsequently been removed.  Small amount of blood noted at meatus but urine yellow with voiding.   Allergies: Erythromycin  Medications: Prior to Admission medications   Medication Sig Start Date End Date Taking? Authorizing Provider  acetaminophen (TYLENOL) 500 MG tablet Take 500 mg by mouth daily as needed for mild pain.   Yes [provider]  mirtazapine (REMERON) 15 MG tablet Take 15 mg by mouth at bedtime.   Yes [provider]  propranolol (INDERAL) 20 MG tablet Take 20 mg by mouth 2 (two) times daily. 05/31/16  Yes [provider]  rifaximin (XIFAXAN) 550 MG TABS tablet Take 550 mg by mouth 2 (two) times daily.   Yes [provider]  tamsulosin (FLOMAX) 0.4 MG CAPS capsule TAKE 1 capsule Once in the evening Orally 30 day(s) 05/20/17  Yes [provider]  traMADol (ULTRAM) 50 MG tablet Take 50 mg by mouth every 6 (six) hours as needed for moderate pain or severe pain.   Yes [provider]  vitamin B-12 (CYANOCOBALAMIN) 100 MCG tablet Take 100 mcg by mouth daily.   Yes [provider]  vitamin C (ASCORBIC ACID) 500 MG tablet Take 500 mg by mouth daily.   Yes [provider]  cholecalciferol (VITAMIN D) 1000 units tablet Take 2,000 Units by mouth daily.    [provider]  loratadine-pseudoephedrine (CLARITIN-D 24-HOUR) 10-240 MG 24 hr tablet Take 1 tablet by mouth daily as needed for allergies.     [provider]  sodium  chloride (OCEAN) 0.65 % SOLN nasal spray Place 1 spray into both nostrils as needed for congestion.    [provider]     Vital Signs: BP 128/64 (BP Location: Left Arm)   Pulse 68   Temp 98.3 F (36.8 C) (Oral)   Resp 16   Ht '5\' 5"'  (1.651 m)   Wt 138 lb 10.7 oz (62.9 kg)   SpO2 100%   BMI 23.08 kg/m   Physical Exam awake, alert.  Abdomen soft, positive bowel sounds, not significantly tender.  Right CFA access site soft, clean, dry, no visible hematoma, no ecchymosis.  Intact distal pulses, no significant lower extremity edema.  Imaging: Ct Head Wo Contrast  Result Date: 12/09/2017 CLINICAL DATA:  Syncopal episode yesterday. Dizzy. Hepatocellular carcinoma. EXAM: CT HEAD WITHOUT CONTRAST TECHNIQUE: Contiguous axial images were obtained from the base of the skull through the vertex without intravenous contrast. COMPARISON:  None. FINDINGS: Brain: Mild white matter changes are present bilaterally. No acute or focal cortical infarct is present. Basal ganglia are intact. Insular ribbon is normal bilaterally. The brainstem and cerebellum are normal. Ventricles are of normal size. No significant extra-axial fluid collection is present. Vascular: Atherosclerotic calcifications are present within the cavernous internal carotid arteries bilaterally. There is no hyperdense vessel. Skull: Calvarium is intact. No focal lytic or blastic lesions are present. Sinuses/Orbits: The paranasal sinuses and mastoid air cells are clear. Bilateral lens replacements are present. Globes and orbits are within normal limits. IMPRESSION: 1. No acute intracranial abnormality or focal lesion to explain the patient's syncopal  episode or dizziness. 2. Mild white matter changes are within normal limits for age. 3. Atherosclerosis. Electronically Signed   By: San Morelle M.D.   On: 12/09/2017 13:26   Nm Liver Img Spect  Result Date: 12/08/2017 CLINICAL DATA:  Pre yttrium 90 radioembolization evaluation.  EXAM: NUCLEAR MEDICINE LIVER SCAN; ULTRASOUND MISCELLANEOUS SOFT TISSUE TECHNIQUE: Abdominal images were obtained in multiple projections after intrahepatic arterial injection of radiopharmaceutical. SPECT imaging was performed. Lung shunt calculation was performed. RADIOPHARMACEUTICALS:  4.70mllicurie MAA TECHNETIUM TO 10M ALBUMIN AGGREGATED COMPARISON:  MRI 06/28/2017, angiography 12/08/2017 FINDINGS: The injected microaggregated albumin localizes within the RIGHT hepatic lobe. Small focus of activity in the porta hepatis. No evidence of activity within the stomach, duodenum, or bowel. Calculated shunt fraction to the lungs equals 8.0%. IMPRESSION: 1. Injected MAA tracer activity localizes to the RIGHT hepatic lobe. 2. Small focus of radiotracer activity within the porta hepatis. Recommend catheter angiography evaluation at time of treatment. 3. Lung shunt fraction equals 8.0%. Findings conveyed toJOHN WATTS on 12/08/2017  at16:08. Electronically Signed   By: SSuzy BouchardM.D.   On: 12/08/2017 16:09   Ir Angiogram Visceral Selective  Result Date: 12/08/2017 INDICATION: History of multifocal hepatocellular carcinoma. Patient presents today for Y 90 mapping procedure. Please refer to formal consultation in the epic EMR dated 11/17/2017 for additional details. EXAM: 1. ULTRASOUND GUIDANCE FOR ARTERIAL ACCESS 2. CELIAC AND SUPERIOR MESENTERIC ARTERIOGRAM (1st ORDER) 3. SELECTIVE COMMON AND PROPER HEPATIC ARTERIOGRAMS 4. SELECTIVE ARTERIOGRAM OF ACCESSORY PANCREATICODUODENAL ARTERY AND PERCUTANEOUS COIL EMBOLIZATION 5. SELECTIVE ARTERIOGRAM OF ACCESSORY RIGHT HEPATIC ARTERY AND ADMINISTRATION OF Tc935mAA 6. SELECTIVE ARTERIOGRAM OF THE RIGHT HEPATIC ARTERY AND ADMINISTRATION OF Tc9998mA COMPARISON:  Hepatic bland embolization - 07/25/2017; abdominal MRI - 11/01/2017; CTA of the abdomen pelvis - 07/07/2017 MEDICATIONS: None RADIOPHARMACEUTICALS:  A total of 5 mCi Tc99m39m was administered, 2 mCi via an  accessory right hepatic artery and 3 mCi via the right hepatic artery. CONTRAST:  75 cc Isovue 300 ANESTHESIA/SEDATION: Moderate (conscious) sedation was employed during this procedure. A total of Versed 3 mg and Fentanyl 150 mcg was administered intravenously. Moderate Sedation Time: 59 minutes. The patient's level of consciousness and vital signs were monitored continuously by radiology nursing throughout the procedure under my direct supervision. FLUOROSCOPY TIME:  14 minutes, 48 seconds (584 mGy) ACCESS: Right common femoral artery; hemostasis achieved with manual compression. COMPLICATIONS: None immediate. TECHNIQUE: Informed written consent was obtained from the patient after a discussion of the risks, benefits and alternatives to treatment. Questions regarding the procedure were encouraged and answered. A timeout was performed prior to the initiation of the procedure. The right groin was prepped and draped in the usual sterile fashion, and a sterile drape was applied covering the operative field. Maximum barrier sterile technique with sterile gowns and gloves were used for the procedure. A timeout was performed prior to the initiation of the procedure. Local anesthesia was provided with 1% lidocaine. The right femoral head was marked fluoroscopically. Under ultrasound guidance, the right common femoral artery was accessed with a micropuncture kit after the overlying soft tissues were anesthetized with 1% lidocaine. An ultrasound image was saved for documentation purposes. The micropuncture sheath was exchanged for a 5 FrenPakistancular sheath over a Bentson wire. A closure arteriogram was performed through the side of the sheath confirming access within the right common femoral artery. Over a Bentson wire, a Mickelson catheter was advanced to the level of the thoracic aorta where it was back bled and  flushed. The catheter was then utilized to select the superior mesenteric artery and a superior mesenteric  arteriogram with delayed portal venous imaging was performed. The Mickelson catheter was then advanced cranially and utilized to select the celiac artery and a celiac arteriogram was performed. Utilizing a fathom 14 microwire, a regular renegade micro catheter was utilized to select common and proper hepatic arteries and selective arteriograms were performed. The Renegade microcatheter was utilized to select an accessory pancreaticoduodenal artery noted to arise subjacent to the left hepatic artery. Selective arteriogram was performed confirming appropriate positioning and the vessel was subsequent percutaneously coil embolized with 3 overlapping 2 mm x 5 mm partial coils to near the vessel's origin. The microcatheter was retracted into the proper hepatic artery and a post embolization proper hepatic arteriogram was performed Next, the microcatheter was utilized to select the accessory right hepatic artery and accessory right hepatic arteriogram was performed. From this location, 2 mCi of technetium 99 MAA was administered. Next the microcatheter was advanced into the right hepatic artery and a selective right hepatic arteriogram was performed. From the location proximal to the vessels bifurcation, 3 mCi of technetium 99 MAA was administered. At this point, the procedure was terminated. All wires and catheters and sheaths were removed from the patient. Hemostasis was achieved at the right groin and access site with manual compression. A dressing was placed. The patient tolerated procedure well without immediate postprocedural complication. The patient was escorted to nuclear medicine department for planar imaging. FINDINGS: Selective superior mesenteric arteriogram was again negative for accessory or replaced hepatic arterial supply. Late portal venous phase imaging demonstrates patency of the main, right and left portal veins. Celiac arteriogram re-demonstrates an accessory right hepatic artery supplying the medial  aspect of the right lobe of the liver. 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 Successful split dose administration of a total of 5 mCi of technetium 82mMAA via the right hepatic and accessory right hepatic arteries. IMPRESSION: 1. Successful pre Y-90 arteriogram with percutaneous coil embolization of the pancreaticoduodenal artery. 2. Successful split dose administration of a total of 5 mCi of technetium 980mAA via the right hepatic and accessory right hepatic arteries. Awaiting results of nuclear medicine liver scan. PLAN: Pending the results of the nuclear medicine liver scan, the patient will return Y90 radioembolization the right hepatic artery. Pending the patient's recovery from this initial treatment session, the patient will return for radioembolization of the accessory right hepatic artery. Electronically Signed   By: JoSandi Mariscal.D.   On: 12/08/2017 13:45   Ir Angiogram Visceral Selective  Result Date: 12/08/2017 INDICATION: History of multifocal hepatocellular carcinoma. Patient presents today for Y 90 mapping procedure. Please refer to formal consultation in the epic EMR dated 11/17/2017 for additional details. EXAM: 1. ULTRASOUND GUIDANCE FOR ARTERIAL ACCESS 2. CELIAC AND SUPERIOR MESENTERIC ARTERIOGRAM (1st ORDER) 3. SELECTIVE COMMON AND PROPER HEPATIC ARTERIOGRAMS 4. SELECTIVE ARTERIOGRAM OF ACCESSORY PANCREATICODUODENAL ARTERY AND PERCUTANEOUS COIL EMBOLIZATION 5. SELECTIVE ARTERIOGRAM OF ACCESSORY RIGHT HEPATIC ARTERY AND ADMINISTRATION OF Tc9941mA 6. SELECTIVE ARTERIOGRAM OF THE RIGHT HEPATIC ARTERY AND ADMINISTRATION OF Tc99m56m COMPARISON:  Hepatic bland embolization - 07/25/2017; abdominal MRI - 11/01/2017; CTA of the abdomen pelvis - 07/07/2017 MEDICATIONS: None RADIOPHARMACEUTICALS:  A total of 5 mCi Tc99m 22mwas administered, 2  mCi via an accessory right hepatic artery and 3 mCi via  the right hepatic artery. CONTRAST:  75 cc Isovue 300 ANESTHESIA/SEDATION: Moderate (conscious) sedation was employed during this procedure. A total of Versed 3 mg and Fentanyl 150 mcg was administered intravenously. Moderate Sedation Time: 59 minutes. The patient's level of consciousness and vital signs were monitored continuously by radiology nursing throughout the procedure under my direct supervision. FLUOROSCOPY TIME:  14 minutes, 48 seconds (584 mGy) ACCESS: Right common femoral artery; hemostasis achieved with manual compression. COMPLICATIONS: None immediate. TECHNIQUE: Informed written consent was obtained from the patient after a discussion of the risks, benefits and alternatives to treatment. Questions regarding the procedure were encouraged and answered. A timeout was performed prior to the initiation of the procedure. The right groin was prepped and draped in the usual sterile fashion, and a sterile drape was applied covering the operative field. Maximum barrier sterile technique with sterile gowns and gloves were used for the procedure. A timeout was performed prior to the initiation of the procedure. Local anesthesia was provided with 1% lidocaine. The right femoral head was marked fluoroscopically. Under ultrasound guidance, the right common femoral artery was accessed with a micropuncture kit after the overlying soft tissues were anesthetized with 1% lidocaine. An ultrasound image was saved for documentation purposes. The micropuncture sheath was exchanged for a 5 [place] vascular sheath over a Bentson wire. A closure arteriogram was performed through the side of the sheath confirming access within the right common femoral artery. Over a Bentson wire, a Mickelson catheter was advanced to the level of the thoracic aorta where it was back bled and flushed. The catheter was then utilized to select the superior mesenteric artery and a superior  mesenteric arteriogram with delayed portal venous imaging was performed. The Mickelson catheter was then advanced cranially and utilized to select the celiac artery and a celiac arteriogram was performed. Utilizing a fathom 14 microwire, a regular renegade micro catheter was utilized to select common and proper hepatic arteries and selective arteriograms were performed. The Renegade microcatheter was utilized to select an accessory pancreaticoduodenal artery noted to arise subjacent to the left hepatic artery. Selective arteriogram was performed confirming appropriate positioning and the vessel was subsequent percutaneously coil embolized with 3 overlapping 2 mm x 5 mm partial coils to near the vessel's origin. The microcatheter was retracted into the proper hepatic artery and a post embolization proper hepatic arteriogram was performed Next, the microcatheter was utilized to select the accessory right hepatic artery and accessory right hepatic arteriogram was performed. From this location, 2 mCi of technetium 99 MAA was administered. Next the microcatheter was advanced into the right hepatic artery and a selective right hepatic arteriogram was performed. From the location proximal to the vessels bifurcation, 3 mCi of technetium 99 MAA was administered. At this point, the procedure was terminated. All wires and catheters and sheaths were removed from the patient. Hemostasis was achieved at the right groin and access site with manual compression. A dressing was placed. The patient tolerated procedure well without immediate postprocedural complication. The patient was escorted to nuclear medicine department for planar imaging. FINDINGS: Selective superior mesenteric arteriogram was again negative for accessory or replaced hepatic arterial supply. Late portal venous phase imaging demonstrates patency of the main, right and left portal veins. Celiac arteriogram re-demonstrates an accessory right hepatic artery supplying  the medial aspect of the right lobe of the liver. An accessory pancreaticoduodenal artery was again noted to arise subjacent to the takeoff of the left hepatic artery and was subsequently selected and coil embolized to  near the vessel's origin. Successful split dose administration of a total of 5 mCi of technetium 36mMAA via the right hepatic and accessory right hepatic arteries. IMPRESSION: 1. Successful pre Y-90 arteriogram with percutaneous coil embolization of the pancreaticoduodenal artery. 2. Successful split dose administration of a total of 5 mCi of technetium 978mAA via the right hepatic and accessory right hepatic arteries. Awaiting results of nuclear medicine liver scan. PLAN: Pending the results of the nuclear medicine liver scan, the patient will return Y90 radioembolization the right hepatic artery. Pending the patient's recovery from this initial treatment session, the patient will return for radioembolization of the accessory right hepatic artery. Electronically Signed   By: JoSandi Mariscal.D.   On: 12/08/2017 13:45   Ir Angiogram Selective Each Additional Vessel  Result Date: 12/08/2017 INDICATION: History of multifocal hepatocellular carcinoma. Patient presents today for Y 90 mapping procedure. Please refer to formal consultation in the epic EMR dated 11/17/2017 for additional details. EXAM: 1. ULTRASOUND GUIDANCE FOR ARTERIAL ACCESS 2. CELIAC AND SUPERIOR MESENTERIC ARTERIOGRAM (1st ORDER) 3. SELECTIVE COMMON AND PROPER HEPATIC ARTERIOGRAMS 4. SELECTIVE ARTERIOGRAM OF ACCESSORY PANCREATICODUODENAL ARTERY AND PERCUTANEOUS COIL EMBOLIZATION 5. SELECTIVE ARTERIOGRAM OF ACCESSORY RIGHT HEPATIC ARTERY AND ADMINISTRATION OF Tc9967mA 6. SELECTIVE ARTERIOGRAM OF THE RIGHT HEPATIC ARTERY AND ADMINISTRATION OF Tc99m22m COMPARISON:  Hepatic bland embolization - 07/25/2017; abdominal MRI - 11/01/2017; CTA of the abdomen pelvis - 07/07/2017 MEDICATIONS: None RADIOPHARMACEUTICALS:  A total of 5 mCi Tc99m 87m was administered, 2 mCi via an accessory right hepatic artery and 3 mCi via the right hepatic artery. CONTRAST:  75 cc Isovue 300 ANESTHESIA/SEDATION: Moderate (conscious) sedation was employed during this procedure. A total of Versed 3 mg and Fentanyl 150 mcg was administered intravenously. Moderate Sedation Time: 59 minutes. The patient's level of consciousness and vital signs were monitored continuously by radiology nursing throughout the procedure under my direct supervision. FLUOROSCOPY TIME:  14 minutes, 48 seconds (584 mGy) ACCESS: Right common femoral artery; hemostasis achieved with manual compression. COMPLICATIONS: None immediate. TECHNIQUE: Informed written consent was obtained from the patient after a discussion of the risks, benefits and alternatives to treatment. Questions regarding the procedure were encouraged and answered. A timeout was performed prior to the initiation of the procedure. The right groin was prepped and draped in the usual sterile fashion, and a sterile drape was applied covering the operative field. Maximum barrier sterile technique with sterile gowns and gloves were used for the procedure. A timeout was performed prior to the initiation of the procedure. Local anesthesia was provided with 1% lidocaine. The right femoral head was marked fluoroscopically. Under ultrasound guidance, the right common femoral artery was accessed with a micropuncture kit after the overlying soft tissues were anesthetized with 1% lidocaine. An ultrasound image was saved for documentation purposes. The micropuncture sheath was exchanged for a 5 [place]ren[place] sheath over a Bentson wire. A closure arteriogram was performed through the side of the sheath confirming access within the right common femoral artery. Over a Bentson wire, a Mickelson catheter was advanced to the level of the thoracic aorta where it was back bled and flushed. The catheter was then utilized to select the superior mesenteric  artery and a superior mesenteric arteriogram with delayed portal venous imaging was performed. The Mickelson catheter was then advanced cranially and utilized to select the celiac artery and a celiac arteriogram was performed. Utilizing a fathom 14 microwire, a regular renegade micro catheter was utilized to select common and proper  hepatic arteries and selective arteriograms were performed. The Renegade microcatheter was utilized to select an accessory pancreaticoduodenal artery noted to arise subjacent to the left hepatic artery. Selective arteriogram was performed confirming appropriate positioning and the vessel was subsequent percutaneously coil embolized with 3 overlapping 2 mm x 5 mm partial coils to near the vessel's origin. The microcatheter was retracted into the proper hepatic artery and a post embolization proper hepatic arteriogram was performed Next, the microcatheter was utilized to select the accessory right hepatic artery and accessory right hepatic arteriogram was performed. From this location, 2 mCi of technetium 99 MAA was administered. Next the microcatheter was advanced into the right hepatic artery and a selective right hepatic arteriogram was performed. From the location proximal to the vessels bifurcation, 3 mCi of technetium 99 MAA was administered. At this point, the procedure was terminated. All wires and catheters and sheaths were removed from the patient. Hemostasis was achieved at the right groin and access site with manual compression. A dressing was placed. The patient tolerated procedure well without immediate postprocedural complication. The patient was escorted to nuclear medicine department for planar imaging. FINDINGS: Selective superior mesenteric arteriogram was again negative for accessory or replaced hepatic arterial supply. Late portal venous phase imaging demonstrates patency of the main, right and left portal veins. Celiac arteriogram re-demonstrates an accessory right  hepatic artery supplying the medial aspect of the right lobe of the liver. 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 Successful split dose administration of a total of 5 mCi of technetium 14mMAA via the right hepatic and accessory right hepatic arteries. IMPRESSION: 1. Successful pre Y-90 arteriogram with percutaneous coil embolization of the pancreaticoduodenal artery. 2. Successful split dose administration of a total of 5 mCi of technetium 929mAA via the right hepatic and accessory right hepatic arteries. Awaiting results of nuclear medicine liver scan. PLAN: Pending the results of the nuclear medicine liver scan, the patient will return Y90 radioembolization the right hepatic artery. Pending the patient's recovery from this initial treatment session, the patient will return for radioembolization of the accessory right hepatic artery. Electronically Signed   By: JoSandi Mariscal.D.   On: 12/08/2017 13:45   Ir Angiogram Selective Each Additional Vessel  Result Date: 12/08/2017 INDICATION: History of multifocal hepatocellular carcinoma. Patient presents today for Y 90 mapping procedure. Please refer to formal consultation in the epic EMR dated 11/17/2017 for additional details. EXAM: 1. ULTRASOUND GUIDANCE FOR ARTERIAL ACCESS 2. CELIAC AND SUPERIOR MESENTERIC ARTERIOGRAM (1st ORDER) 3. SELECTIVE COMMON AND PROPER HEPATIC ARTERIOGRAMS 4. SELECTIVE ARTERIOGRAM OF ACCESSORY PANCREATICODUODENAL ARTERY AND PERCUTANEOUS COIL EMBOLIZATION 5. SELECTIVE ARTERIOGRAM OF ACCESSORY RIGHT HEPATIC ARTERY AND ADMINISTRATION OF Tc9946mA 6. SELECTIVE ARTERIOGRAM OF THE RIGHT HEPATIC ARTERY AND ADMINISTRATION OF Tc99m8m COMPARISON:  Hepatic bland embolization - 07/25/2017; abdominal MRI - 11/01/2017; CTA of the abdomen pelvis - 07/07/2017 MEDICATIONS: None RADIOPHARMACEUTICALS:   A total of 5 mCi Tc99m 91mwas administered, 2 mCi via an accessory right hepatic artery and 3 mCi via the right hepatic artery. CONTRAST:  75 cc Isovue 300 ANESTHESIA/SEDATION: Moderate (conscious) sedation was employed during this procedure. A total of Versed 3 mg and Fentanyl 150 mcg was administered intravenously. Moderate Sedation Time: 59 minutes. The patient's level of consciousness and vital signs were monitored continuously by radiology nursing throughout the procedure under my direct supervision. FLUOROSCOPY TIME:  14 minutes, 48 seconds (584 mGy) ACCESS: Right common femoral artery; hemostasis achieved with manual compression. COMPLICATIONS: None immediate. TECHNIQUE: Informed written consent was obtained from the patient after a discussion of the risks, benefits and alternatives to treatment. Questions regarding the procedure were encouraged and answered. A timeout was performed prior to the initiation of the procedure. The right groin was prepped and draped in the usual sterile fashion, and a sterile drape was applied covering the operative field. Maximum barrier sterile technique with sterile gowns and gloves were used for the procedure. A timeout was performed prior to the initiation of the procedure. Local anesthesia was provided with 1% lidocaine. The right femoral head was marked fluoroscopically. Under ultrasound guidance, the right common femoral artery was accessed with a micropuncture kit after the overlying soft tissues were anesthetized with 1% lidocaine. An ultrasound image was saved for documentation purposes. The micropuncture sheath was exchanged for a 5 [place] vascular sheath over a Bentson wire. A closure arteriogram was performed through the side of the sheath confirming access within the right common femoral artery. Over a Bentson wire, a Mickelson catheter was advanced to the level of the thoracic aorta where it was back bled and flushed. The catheter was then utilized to select the  superior mesenteric artery and a superior mesenteric arteriogram with delayed portal venous imaging was performed. The Mickelson catheter was then advanced cranially and utilized to select the celiac artery and a celiac arteriogram was performed. Utilizing a fathom 14 microwire, a regular renegade micro catheter was utilized to select common and proper hepatic arteries and selective arteriograms were performed. The Renegade microcatheter was utilized to select an accessory pancreaticoduodenal artery noted to arise subjacent to the left hepatic artery. Selective arteriogram was performed confirming appropriate positioning and the vessel was subsequent percutaneously coil embolized with 3 overlapping 2 mm x 5 mm partial coils to near the vessel's origin. The microcatheter was retracted into the proper hepatic artery and a post embolization proper hepatic arteriogram was performed Next, the microcatheter was utilized to select the accessory right hepatic artery and accessory right hepatic arteriogram was performed. From this location, 2 mCi of technetium 99 MAA was administered. Next the microcatheter was advanced into the right hepatic artery and a selective right hepatic arteriogram was performed. From the location proximal to the vessels bifurcation, 3 mCi of technetium 99 MAA was administered. At this point, the procedure was terminated. All wires and catheters and sheaths were removed from the patient. Hemostasis was achieved at the right groin and access site with manual compression. A dressing was placed. The patient tolerated procedure well without immediate postprocedural complication. The patient was escorted to nuclear medicine department for planar imaging. FINDINGS: Selective superior mesenteric arteriogram was again negative for accessory or replaced hepatic arterial supply. Late portal venous phase imaging demonstrates patency of the main, right and left portal veins. Celiac arteriogram re-demonstrates  an accessory right hepatic artery supplying the medial aspect of the right lobe of the liver. 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 Successful split dose administration of a total of 5 mCi of technetium 56mMAA via the right hepatic and accessory right hepatic arteries. IMPRESSION: 1. Successful pre Y-90 arteriogram with percutaneous coil embolization of the pancreaticoduodenal artery. 2. Successful split dose administration of a total of 5 mCi of technetium 922mAA via the right hepatic and  accessory right hepatic arteries. Awaiting results of nuclear medicine liver scan. PLAN: Pending the results of the nuclear medicine liver scan, the patient will return Y90 radioembolization the right hepatic artery. Pending the patient's recovery from this initial treatment session, the patient will return for radioembolization of the accessory right hepatic artery. Electronically Signed   By: Sandi Mariscal M.D.   On: 12/08/2017 13:45   Ir Angiogram Selective Each Additional Vessel  Result Date: 12/08/2017 INDICATION: History of multifocal hepatocellular carcinoma. Patient presents today for Y 90 mapping procedure. Please refer to formal consultation in the epic EMR dated 11/17/2017 for additional details. EXAM: 1. ULTRASOUND GUIDANCE FOR ARTERIAL ACCESS 2. CELIAC AND SUPERIOR MESENTERIC ARTERIOGRAM (1st ORDER) 3. SELECTIVE COMMON AND PROPER HEPATIC ARTERIOGRAMS 4. SELECTIVE ARTERIOGRAM OF ACCESSORY PANCREATICODUODENAL ARTERY AND PERCUTANEOUS COIL EMBOLIZATION 5. SELECTIVE ARTERIOGRAM OF ACCESSORY RIGHT HEPATIC ARTERY AND ADMINISTRATION OF Tc27mMAA 6. SELECTIVE ARTERIOGRAM OF THE RIGHT HEPATIC ARTERY AND ADMINISTRATION OF Tc941mAA COMPARISON:  Hepatic bland embolization - 07/25/2017; abdominal MRI - 11/01/2017; CTA of the abdomen pelvis - 07/07/2017 MEDICATIONS: None  RADIOPHARMACEUTICALS:  A total of 5 mCi Tc9965mA was administered, 2 mCi via an accessory right hepatic artery and 3 mCi via the right hepatic artery. CONTRAST:  75 cc Isovue 300 ANESTHESIA/SEDATION: Moderate (conscious) sedation was employed during this procedure. A total of Versed 3 mg and Fentanyl 150 mcg was administered intravenously. Moderate Sedation Time: 59 minutes. The patient's level of consciousness and vital signs were monitored continuously by radiology nursing throughout the procedure under my direct supervision. FLUOROSCOPY TIME:  14 minutes, 48 seconds (584 mGy) ACCESS: Right common femoral artery; hemostasis achieved with manual compression. COMPLICATIONS: None immediate. TECHNIQUE: Informed written consent was obtained from the patient after a discussion of the risks, benefits and alternatives to treatment. Questions regarding the procedure were encouraged and answered. A timeout was performed prior to the initiation of the procedure. The right groin was prepped and draped in the usual sterile fashion, and a sterile drape was applied covering the operative field. Maximum barrier sterile technique with sterile gowns and gloves were used for the procedure. A timeout was performed prior to the initiation of the procedure. Local anesthesia was provided with 1% lidocaine. The right femoral head was marked fluoroscopically. Under ultrasound guidance, the right common femoral artery was accessed with a micropuncture kit after the overlying soft tissues were anesthetized with 1% lidocaine. An ultrasound image was saved for documentation purposes. The micropuncture sheath was exchanged for a 5 FrePakistanscular sheath over a Bentson wire. A closure arteriogram was performed through the side of the sheath confirming access within the right common femoral artery. Over a Bentson wire, a Mickelson catheter was advanced to the level of the thoracic aorta where it was back bled and flushed. The catheter was then  utilized to select the superior mesenteric artery and a superior mesenteric arteriogram with delayed portal venous imaging was performed. The Mickelson catheter was then advanced cranially and utilized to select the celiac artery and a celiac arteriogram was performed. Utilizing a fathom 14 microwire, a regular renegade micro catheter was utilized to select common and proper hepatic arteries and selective arteriograms were performed. The Renegade microcatheter was utilized to select an accessory pancreaticoduodenal artery noted to arise subjacent to the left hepatic artery. Selective arteriogram was performed confirming appropriate positioning and the vessel was subsequent percutaneously coil embolized with 3 overlapping 2 mm x 5 mm partial coils to near the vessel's origin. The microcatheter was retracted  into the proper hepatic artery and a post embolization proper hepatic arteriogram was performed Next, the microcatheter was utilized to select the accessory right hepatic artery and accessory right hepatic arteriogram was performed. From this location, 2 mCi of technetium 99 MAA was administered. Next the microcatheter was advanced into the right hepatic artery and a selective right hepatic arteriogram was performed. From the location proximal to the vessels bifurcation, 3 mCi of technetium 99 MAA was administered. At this point, the procedure was terminated. All wires and catheters and sheaths were removed from the patient. Hemostasis was achieved at the right groin and access site with manual compression. A dressing was placed. The patient tolerated procedure well without immediate postprocedural complication. The patient was escorted to nuclear medicine department for planar imaging. FINDINGS: Selective superior mesenteric arteriogram was again negative for accessory or replaced hepatic arterial supply. Late portal venous phase imaging demonstrates patency of the main, right and left portal veins. Celiac  arteriogram re-demonstrates an accessory right hepatic artery supplying the medial aspect of the right lobe of the liver. 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 Successful split dose administration of a total of 5 mCi of technetium 55mMAA via the right hepatic and accessory right hepatic arteries. IMPRESSION: 1. Successful pre Y-90 arteriogram with percutaneous coil embolization of the pancreaticoduodenal artery. 2. Successful split dose administration of a total of 5 mCi of technetium 974mAA via the right hepatic and accessory right hepatic arteries. Awaiting results of nuclear medicine liver scan. PLAN: Pending the results of the nuclear medicine liver scan, the patient will return Y90 radioembolization the right hepatic artery. Pending the patient's recovery from this initial treatment session, the patient will return for radioembolization of the accessory right hepatic artery. Electronically Signed   By: JoSandi Mariscal.D.   On: 12/08/2017 13:45   Ir Angiogram Selective Each Additional Vessel  Result Date: 12/08/2017 INDICATION: History of multifocal hepatocellular carcinoma. Patient presents today for Y 90 mapping procedure. Please refer to formal consultation in the epic EMR dated 11/17/2017 for additional details. EXAM: 1. ULTRASOUND GUIDANCE FOR ARTERIAL ACCESS 2. CELIAC AND SUPERIOR MESENTERIC ARTERIOGRAM (1st ORDER) 3. SELECTIVE COMMON AND PROPER HEPATIC ARTERIOGRAMS 4. SELECTIVE ARTERIOGRAM OF ACCESSORY PANCREATICODUODENAL ARTERY AND PERCUTANEOUS COIL EMBOLIZATION 5. SELECTIVE ARTERIOGRAM OF ACCESSORY RIGHT HEPATIC ARTERY AND ADMINISTRATION OF Tc9953mA 6. SELECTIVE ARTERIOGRAM OF THE RIGHT HEPATIC ARTERY AND ADMINISTRATION OF Tc99m65m COMPARISON:  Hepatic bland embolization - 07/25/2017; abdominal MRI - 11/01/2017; CTA of the abdomen pelvis -  07/07/2017 MEDICATIONS: None RADIOPHARMACEUTICALS:  A total of 5 mCi Tc99m 73mwas administered, 2 mCi via an accessory right hepatic artery and 3 mCi via the right hepatic artery. CONTRAST:  75 cc Isovue 300 ANESTHESIA/SEDATION: Moderate (conscious) sedation was employed during this procedure. A total of Versed 3 mg and Fentanyl 150 mcg was administered intravenously. Moderate Sedation Time: 59 minutes. The patient's level of consciousness and vital signs were monitored continuously by radiology nursing throughout the procedure under my direct supervision. FLUOROSCOPY TIME:  14 minutes, 48 seconds (584 mGy) ACCESS: Right common femoral artery; hemostasis achieved with manual compression. COMPLICATIONS: None immediate. TECHNIQUE: Informed written consent was obtained from the patient after a discussion of the risks, benefits and alternatives to treatment. Questions regarding the procedure were encouraged and answered. A timeout was performed prior to the initiation of the procedure. The right  groin was prepped and draped in the usual sterile fashion, and a sterile drape was applied covering the operative field. Maximum barrier sterile technique with sterile gowns and gloves were used for the procedure. A timeout was performed prior to the initiation of the procedure. Local anesthesia was provided with 1% lidocaine. The right femoral head was marked fluoroscopically. Under ultrasound guidance, the right common femoral artery was accessed with a micropuncture kit after the overlying soft tissues were anesthetized with 1% lidocaine. An ultrasound image was saved for documentation purposes. The micropuncture sheath was exchanged for a 5 [place] vascular sheath over a Bentson wire. A closure arteriogram was performed through the side of the sheath confirming access within the right common femoral artery. Over a Bentson wire, a Mickelson catheter was advanced to the level of the thoracic aorta where it was back bled and  flushed. The catheter was then utilized to select the superior mesenteric artery and a superior mesenteric arteriogram with delayed portal venous imaging was performed. The Mickelson catheter was then advanced cranially and utilized to select the celiac artery and a celiac arteriogram was performed. Utilizing a fathom 14 microwire, a regular renegade micro catheter was utilized to select common and proper hepatic arteries and selective arteriograms were performed. The Renegade microcatheter was utilized to select an accessory pancreaticoduodenal artery noted to arise subjacent to the left hepatic artery. Selective arteriogram was performed confirming appropriate positioning and the vessel was subsequent percutaneously coil embolized with 3 overlapping 2 mm x 5 mm partial coils to near the vessel's origin. The microcatheter was retracted into the proper hepatic artery and a post embolization proper hepatic arteriogram was performed Next, the microcatheter was utilized to select the accessory right hepatic artery and accessory right hepatic arteriogram was performed. From this location, 2 mCi of technetium 99 MAA was administered. Next the microcatheter was advanced into the right hepatic artery and a selective right hepatic arteriogram was performed. From the location proximal to the vessels bifurcation, 3 mCi of technetium 99 MAA was administered. At this point, the procedure was terminated. All wires and catheters and sheaths were removed from the patient. Hemostasis was achieved at the right groin and access site with manual compression. A dressing was placed. The patient tolerated procedure well without immediate postprocedural complication. The patient was escorted to nuclear medicine department for planar imaging. FINDINGS: Selective superior mesenteric arteriogram was again negative for accessory or replaced hepatic arterial supply. Late portal venous phase imaging demonstrates patency of the main, right and  left portal veins. Celiac arteriogram re-demonstrates an accessory right hepatic artery supplying the medial aspect of the right lobe of the liver. 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 Successful split dose administration of a total of 5 mCi of technetium 60mMAA via the right hepatic and accessory right hepatic arteries. IMPRESSION: 1. Successful pre Y-90 arteriogram with percutaneous coil embolization of the pancreaticoduodenal artery. 2. Successful split dose administration of a total of 5 mCi of technetium 976mAA via the right hepatic and accessory right hepatic arteries. Awaiting results of nuclear medicine liver scan. PLAN: Pending the results of the nuclear medicine liver scan, the patient will return Y90 radioembolization the right hepatic artery. Pending the patient's recovery from this initial treatment session, the patient will return for radioembolization of the accessory right hepatic artery. Electronically Signed   By: JoSandi Mariscal  M.D.   On: 12/08/2017 13:45   Ir Angiogram Selective Each Additional Vessel  Result Date: 12/08/2017 INDICATION: History of multifocal hepatocellular carcinoma. Patient presents today for Y 90 mapping procedure. Please refer to formal consultation in the epic EMR dated 11/17/2017 for additional details. EXAM: 1. ULTRASOUND GUIDANCE FOR ARTERIAL ACCESS 2. CELIAC AND SUPERIOR MESENTERIC ARTERIOGRAM (1st ORDER) 3. SELECTIVE COMMON AND PROPER HEPATIC ARTERIOGRAMS 4. SELECTIVE ARTERIOGRAM OF ACCESSORY PANCREATICODUODENAL ARTERY AND PERCUTANEOUS COIL EMBOLIZATION 5. SELECTIVE ARTERIOGRAM OF ACCESSORY RIGHT HEPATIC ARTERY AND ADMINISTRATION OF Tc37mMAA 6. SELECTIVE ARTERIOGRAM OF THE RIGHT HEPATIC ARTERY AND ADMINISTRATION OF Tc924mAA COMPARISON:  Hepatic bland embolization - 07/25/2017; abdominal MRI - 11/01/2017; CTA  of the abdomen pelvis - 07/07/2017 MEDICATIONS: None RADIOPHARMACEUTICALS:  A total of 5 mCi Tc999mA was administered, 2 mCi via an accessory right hepatic artery and 3 mCi via the right hepatic artery. CONTRAST:  75 cc Isovue 300 ANESTHESIA/SEDATION: Moderate (conscious) sedation was employed during this procedure. A total of Versed 3 mg and Fentanyl 150 mcg was administered intravenously. Moderate Sedation Time: 59 minutes. The patient's level of consciousness and vital signs were monitored continuously by radiology nursing throughout the procedure under my direct supervision. FLUOROSCOPY TIME:  14 minutes, 48 seconds (584 mGy) ACCESS: Right common femoral artery; hemostasis achieved with manual compression. COMPLICATIONS: None immediate. TECHNIQUE: Informed written consent was obtained from the patient after a discussion of the risks, benefits and alternatives to treatment. Questions regarding the procedure were encouraged and answered. A timeout was performed prior to the initiation of the procedure. The right groin was prepped and draped in the usual sterile fashion, and a sterile drape was applied covering the operative field. Maximum barrier sterile technique with sterile gowns and gloves were used for the procedure. A timeout was performed prior to the initiation of the procedure. Local anesthesia was provided with 1% lidocaine. The right femoral head was marked fluoroscopically. Under ultrasound guidance, the right common femoral artery was accessed with a micropuncture kit after the overlying soft tissues were anesthetized with 1% lidocaine. An ultrasound image was saved for documentation purposes. The micropuncture sheath was exchanged for a 5 FrePakistanscular sheath over a Bentson wire. A closure arteriogram was performed through the side of the sheath confirming access within the right common femoral artery. Over a Bentson wire, a Mickelson catheter was advanced to the level of the thoracic aorta where  it was back bled and flushed. The catheter was then utilized to select the superior mesenteric artery and a superior mesenteric arteriogram with delayed portal venous imaging was performed. The Mickelson catheter was then advanced cranially and utilized to select the celiac artery and a celiac arteriogram was performed. Utilizing a fathom 14 microwire, a regular renegade micro catheter was utilized to select common and proper hepatic arteries and selective arteriograms were performed. The Renegade microcatheter was utilized to select an accessory pancreaticoduodenal artery noted to arise subjacent to the left hepatic artery. Selective arteriogram was performed confirming appropriate positioning and the vessel was subsequent percutaneously coil embolized with 3 overlapping 2 mm x 5 mm partial coils to near the vessel's origin. The microcatheter was retracted into the proper hepatic artery and a post embolization proper hepatic arteriogram was performed Next, the microcatheter was utilized to select the accessory right hepatic artery and accessory right hepatic arteriogram was performed. From this location, 2 mCi of technetium 99 MAA was administered. Next the microcatheter was advanced into the right hepatic artery and a selective right hepatic arteriogram  was performed. From the location proximal to the vessels bifurcation, 3 mCi of technetium 99 MAA was administered. At this point, the procedure was terminated. All wires and catheters and sheaths were removed from the patient. Hemostasis was achieved at the right groin and access site with manual compression. A dressing was placed. The patient tolerated procedure well without immediate postprocedural complication. The patient was escorted to nuclear medicine department for planar imaging. FINDINGS: Selective superior mesenteric arteriogram was again negative for accessory or replaced hepatic arterial supply. Late portal venous phase imaging demonstrates patency of  the main, right and left portal veins. Celiac arteriogram re-demonstrates an accessory right hepatic artery supplying the medial aspect of the right lobe of the liver. 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 Successful split dose administration of a total of 5 mCi of technetium 16mMAA via the right hepatic and accessory right hepatic arteries. IMPRESSION: 1. Successful pre Y-90 arteriogram with percutaneous coil embolization of the pancreaticoduodenal artery. 2. Successful split dose administration of a total of 5 mCi of technetium 9101mAA via the right hepatic and accessory right hepatic arteries. Awaiting results of nuclear medicine liver scan. PLAN: Pending the results of the nuclear medicine liver scan, the patient will return Y90 radioembolization the right hepatic artery. Pending the patient's recovery from this initial treatment session, the patient will return for radioembolization of the accessory right hepatic artery. Electronically Signed   By: JoSandi Mariscal.D.   On: 12/08/2017 13:45   Ir Fluoro Rm 30-60 Min  Result Date: 12/09/2017 CLINICAL DATA:  Patient underwent technically successful mapping Y 90 radioembolization earlier today however prior to discharge developed acute onset of right lower abdominal pain. Subsequent CTA demonstrated small area of persistent extravasation at the right common femoral access site. Please perform fluoroscopic assisted localization of the access site of the right common femoral artery prior to continued manual compression in hopes of achieving hemostasis. EXAM: IR FLOURO RM 0-60 MIN TECHNIQUE: Patient was placed supine on the fluoroscopy table and the approximate location of the right common femoral arterial access site was marked fluoroscopically. Next, approximately 30 minutes of additional manual compression  was performed at this location. Patient reports subjective improvement in his right lower abdominal pain and has remained hemodynamically stable throughout this prolonged observation in interventional radiology department. CONTRAST:  None FLUOROSCOPY TIME:  12 seconds COMPARISON:  Mapping Y 90 radioembolization - earlier same day; CT abdomen pelvis - earlier same day FINDINGS: Patient was placed supine on the fluoroscopy table and the approximate location of the right common femoral arterial access site was marked fluoroscopically. Next, approximately 30 minutes of additional manual compression was performed at this location. Patient reports subjective improvement in his right lower abdominal pain and has remained hemodynamically stable throughout this prolonged observation in interventional radiology department. IMPRESSION: Successful fluoroscopic guided marking of the right femoral head followed by additional 30 minutes of manual compression at the right common femoral arterial access site. Electronically Signed   By: JoSandi Mariscal.D.   On: 12/09/2017 13:48   Ir UsKoreauide Vasc Access Right  Result Date: 12/08/2017 INDICATION: History of multifocal hepatocellular carcinoma. Patient presents today for Y 90 mapping procedure. Please refer to formal consultation in the epic EMR dated 11/17/2017 for additional details. EXAM: 1. ULTRASOUND GUIDANCE FOR ARTERIAL ACCESS 2. CELIAC AND SUPERIOR MESENTERIC ARTERIOGRAM (1st ORDER) 3.  SELECTIVE COMMON AND PROPER HEPATIC ARTERIOGRAMS 4. SELECTIVE ARTERIOGRAM OF ACCESSORY PANCREATICODUODENAL ARTERY AND PERCUTANEOUS COIL EMBOLIZATION 5. SELECTIVE ARTERIOGRAM OF ACCESSORY RIGHT HEPATIC ARTERY AND ADMINISTRATION OF Tc54mMAA 6. SELECTIVE ARTERIOGRAM OF THE RIGHT HEPATIC ARTERY AND ADMINISTRATION OF Tc971mAA COMPARISON:  Hepatic bland embolization - 07/25/2017; abdominal MRI - 11/01/2017; CTA of the abdomen pelvis - 07/07/2017 MEDICATIONS: None RADIOPHARMACEUTICALS:  A total of  5 mCi Tc9949mA was administered, 2 mCi via an accessory right hepatic artery and 3 mCi via the right hepatic artery. CONTRAST:  75 cc Isovue 300 ANESTHESIA/SEDATION: Moderate (conscious) sedation was employed during this procedure. A total of Versed 3 mg and Fentanyl 150 mcg was administered intravenously. Moderate Sedation Time: 59 minutes. The patient's level of consciousness and vital signs were monitored continuously by radiology nursing throughout the procedure under my direct supervision. FLUOROSCOPY TIME:  14 minutes, 48 seconds (584 mGy) ACCESS: Right common femoral artery; hemostasis achieved with manual compression. COMPLICATIONS: None immediate. TECHNIQUE: Informed written consent was obtained from the patient after a discussion of the risks, benefits and alternatives to treatment. Questions regarding the procedure were encouraged and answered. A timeout was performed prior to the initiation of the procedure. The right groin was prepped and draped in the usual sterile fashion, and a sterile drape was applied covering the operative field. Maximum barrier sterile technique with sterile gowns and gloves were used for the procedure. A timeout was performed prior to the initiation of the procedure. Local anesthesia was provided with 1% lidocaine. The right femoral head was marked fluoroscopically. Under ultrasound guidance, the right common femoral artery was accessed with a micropuncture kit after the overlying soft tissues were anesthetized with 1% lidocaine. An ultrasound image was saved for documentation purposes. The micropuncture sheath was exchanged for a 5 FrePakistanscular sheath over a Bentson wire. A closure arteriogram was performed through the side of the sheath confirming access within the right common femoral artery. Over a Bentson wire, a Mickelson catheter was advanced to the level of the thoracic aorta where it was back bled and flushed. The catheter was then utilized to select the superior  mesenteric artery and a superior mesenteric arteriogram with delayed portal venous imaging was performed. The Mickelson catheter was then advanced cranially and utilized to select the celiac artery and a celiac arteriogram was performed. Utilizing a fathom 14 microwire, a regular renegade micro catheter was utilized to select common and proper hepatic arteries and selective arteriograms were performed. The Renegade microcatheter was utilized to select an accessory pancreaticoduodenal artery noted to arise subjacent to the left hepatic artery. Selective arteriogram was performed confirming appropriate positioning and the vessel was subsequent percutaneously coil embolized with 3 overlapping 2 mm x 5 mm partial coils to near the vessel's origin. The microcatheter was retracted into the proper hepatic artery and a post embolization proper hepatic arteriogram was performed Next, the microcatheter was utilized to select the accessory right hepatic artery and accessory right hepatic arteriogram was performed. From this location, 2 mCi of technetium 99 MAA was administered. Next the microcatheter was advanced into the right hepatic artery and a selective right hepatic arteriogram was performed. From the location proximal to the vessels bifurcation, 3 mCi of technetium 99 MAA was administered. At this point, the procedure was terminated. All wires and catheters and sheaths were removed from the patient. Hemostasis was achieved at the right groin and access site with manual compression. A dressing was placed. The patient tolerated procedure well without immediate postprocedural complication.  The patient was escorted to nuclear medicine department for planar imaging. FINDINGS: Selective superior mesenteric arteriogram was again negative for accessory or replaced hepatic arterial supply. Late portal venous phase imaging demonstrates patency of the main, right and left portal veins. Celiac arteriogram re-demonstrates an  accessory right hepatic artery supplying the medial aspect of the right lobe of the liver. 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 Successful split dose administration of a total of 5 mCi of technetium 47mMAA via the right hepatic and accessory right hepatic arteries. IMPRESSION: 1. Successful pre Y-90 arteriogram with percutaneous coil embolization of the pancreaticoduodenal artery. 2. Successful split dose administration of a total of 5 mCi of technetium 992mAA via the right hepatic and accessory right hepatic arteries. Awaiting results of nuclear medicine liver scan. PLAN: Pending the results of the nuclear medicine liver scan, the patient will return Y90 radioembolization the right hepatic artery. Pending the patient's recovery from this initial treatment session, the patient will return for radioembolization of the accessory right hepatic artery. Electronically Signed   By: JoSandi Mariscal.D.   On: 12/08/2017 13:45   Ir Embo Arterial Not HeBlack Rockuide Roadmapping  Result Date: 12/08/2017 INDICATION: History of multifocal hepatocellular carcinoma. Patient presents today for Y 90 mapping procedure. Please refer to formal consultation in the epic EMR dated 11/17/2017 for additional details. EXAM: 1. ULTRASOUND GUIDANCE FOR ARTERIAL ACCESS 2. CELIAC AND SUPERIOR MESENTERIC ARTERIOGRAM (1st ORDER) 3. SELECTIVE COMMON AND PROPER HEPATIC ARTERIOGRAMS 4. SELECTIVE ARTERIOGRAM OF ACCESSORY PANCREATICODUODENAL ARTERY AND PERCUTANEOUS COIL EMBOLIZATION 5. SELECTIVE ARTERIOGRAM OF ACCESSORY RIGHT HEPATIC ARTERY AND ADMINISTRATION OF Tc9937mA 6. SELECTIVE ARTERIOGRAM OF THE RIGHT HEPATIC ARTERY AND ADMINISTRATION OF Tc99m80m COMPARISON:  Hepatic bland embolization - 07/25/2017; abdominal MRI - 11/01/2017; CTA of the abdomen pelvis - 07/07/2017 MEDICATIONS:  None RADIOPHARMACEUTICALS:  A total of 5 mCi Tc99m 13mwas administered, 2 mCi via an accessory right hepatic artery and 3 mCi via the right hepatic artery. CONTRAST:  75 cc Isovue 300 ANESTHESIA/SEDATION: Moderate (conscious) sedation was employed during this procedure. A total of Versed 3 mg and Fentanyl 150 mcg was administered intravenously. Moderate Sedation Time: 59 minutes. The patient's level of consciousness and vital signs were monitored continuously by radiology nursing throughout the procedure under my direct supervision. FLUOROSCOPY TIME:  14 minutes, 48 seconds (584 mGy) ACCESS: Right common femoral artery; hemostasis achieved with manual compression. COMPLICATIONS: None immediate. TECHNIQUE: Informed written consent was obtained from the patient after a discussion of the risks, benefits and alternatives to treatment. Questions regarding the procedure were encouraged and answered. A timeout was performed prior to the initiation of the procedure. The right groin was prepped and draped in the usual sterile fashion, and a sterile drape was applied covering the operative field. Maximum barrier sterile technique with sterile gowns and gloves were used for the procedure. A timeout was performed prior to the initiation of the procedure. Local anesthesia was provided with 1% lidocaine. The right femoral head was marked fluoroscopically. Under ultrasound guidance, the right common femoral artery was accessed with a micropuncture kit after the overlying soft tissues were anesthetized with 1% lidocaine. An ultrasound image was saved for documentation purposes. The micropuncture sheath was exchanged for a 5 FrencPakistanular sheath over a Bentson wire. A closure arteriogram was performed through the side of the sheath confirming access  within the right common femoral artery. Over a Bentson wire, a Mickelson catheter was advanced to the level of the thoracic aorta where it was back bled and flushed. The catheter was  then utilized to select the superior mesenteric artery and a superior mesenteric arteriogram with delayed portal venous imaging was performed. The Mickelson catheter was then advanced cranially and utilized to select the celiac artery and a celiac arteriogram was performed. Utilizing a fathom 14 microwire, a regular renegade micro catheter was utilized to select common and proper hepatic arteries and selective arteriograms were performed. The Renegade microcatheter was utilized to select an accessory pancreaticoduodenal artery noted to arise subjacent to the left hepatic artery. Selective arteriogram was performed confirming appropriate positioning and the vessel was subsequent percutaneously coil embolized with 3 overlapping 2 mm x 5 mm partial coils to near the vessel's origin. The microcatheter was retracted into the proper hepatic artery and a post embolization proper hepatic arteriogram was performed Next, the microcatheter was utilized to select the accessory right hepatic artery and accessory right hepatic arteriogram was performed. From this location, 2 mCi of technetium 99 MAA was administered. Next the microcatheter was advanced into the right hepatic artery and a selective right hepatic arteriogram was performed. From the location proximal to the vessels bifurcation, 3 mCi of technetium 99 MAA was administered. At this point, the procedure was terminated. All wires and catheters and sheaths were removed from the patient. Hemostasis was achieved at the right groin and access site with manual compression. A dressing was placed. The patient tolerated procedure well without immediate postprocedural complication. The patient was escorted to nuclear medicine department for planar imaging. FINDINGS: Selective superior mesenteric arteriogram was again negative for accessory or replaced hepatic arterial supply. Late portal venous phase imaging demonstrates patency of the main, right and left portal veins. Celiac  arteriogram re-demonstrates an accessory right hepatic artery supplying the medial aspect of the right lobe of the liver. 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 Successful split dose administration of a total of 5 mCi of technetium 18mMAA via the right hepatic and accessory right hepatic arteries. IMPRESSION: 1. Successful pre Y-90 arteriogram with percutaneous coil embolization of the pancreaticoduodenal artery. 2. Successful split dose administration of a total of 5 mCi of technetium 928mAA via the right hepatic and accessory right hepatic arteries. Awaiting results of nuclear medicine liver scan. PLAN: Pending the results of the nuclear medicine liver scan, the patient will return Y90 radioembolization the right hepatic artery. Pending the patient's recovery from this initial treatment session, the patient will return for radioembolization of the accessory right hepatic artery. Electronically Signed   By: JoSandi Mariscal.D.   On: 12/08/2017 13:45   Ct Angio Abd/pel W/ And/or W/o  Result Date: 12/08/2017 CLINICAL DATA:  Mapping Y 90 radioembolization earlier today, now with right lower abdominal pain. Evaluate for hematoma/access site complication. EXAM: CTA ABDOMEN AND PELVIS WITH CONTRAST TECHNIQUE: Multidetector CT imaging of the abdomen and pelvis was performed using the standard protocol during bolus administration of intravenous contrast. Multiplanar reconstructed images and MIPs were obtained and reviewed to evaluate the vascular anatomy. CONTRAST:  [id number] IOPAMIDOL (ISOVUE-370) INJECTION 76% COMPARISON:  Abdominal MRI-11/01/2017; CT abdomen pelvis - 07/07/2017 FINDINGS: VASCULAR Aorta: Scattered mixed calcified and noncalcified atherosclerotic plaque with a normal caliber abdominal aorta, not resulting in hemodynamically significant  stenosis. Celiac: There is a minimal amount of atherosclerotic plaque involving the origin the celiac artery, not resulting in hemodynamically significant stenosis. Sequela of coil embolization the pancreaticoduodenal artery. SMA: There is a moderate amount of mixed calcified and noncalcified atherosclerotic plaque involving the origin of the SMA which approaches of approximately 50% luminal narrowing. Renals: Solitary bilaterally. There is a minimal amount of calcified atherosclerotic plaque involving the origin of left renal artery, not resulting in a hemodynamically significant stenosis. No vessel irregularity to suggest FMD. IMA: Remains widely patent. Pelvic inflow: There is a minimal amount of calcified atherosclerotic plaque involving the bilateral common iliac arteries, not resulting in a hemodynamically significant stenosis. The bilateral common iliac arteries are diseased though of normal caliber. The bilateral external iliac arteries are of normal caliber and widely patent. Right-sided proximal outflow: There is a minimal amount of mixed calcified and noncalcified atherosclerotic plaque within the right common femoral artery. There is a tiny area of active extravasation at the location of the right common femoral artery access site (representative images 23 through 25) with associated hematoma tracking into the right lower pelvis and abdomen. No definitive retroperitoneal hematoma. The adjacent inferior epigastric artery appears widely patent as does the right deep iliac circumflex artery. No vessel dissection. The imaged portions of the right superficial and deep femoral artery appear widely patent. Left-sided proximal outflow: The left common femoral artery is widely patent without hemodynamically significant stenosis. There is a minimal amount of calcified atherosclerotic plaque involving the origin of the left superficial femoral artery, not resulting in hemodynamically significant stenosis. The left  deep femoral artery is widely patent throughout its imaged course. Veins: The pelvic venous system and IVC appear widely patent. Review of the MIP images confirms the above findings. NON-VASCULAR Lower chest: Limited visualization of the lower thorax is negative for focal airspace opacity or pleural effusion. Coronary artery calcifications.  No pericardial effusion. 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 Re demonstrated ill-defined area of hyperenhancement adjacent to the ablation zone within the anterior segment of the right lobe of the liver (image 23, series 2 as well as nodular areas of enhancement within the right lobe of the liver (images 21, 24 and 34) compatible with known multifocal hepatocellular carcinoma. The portal vein appears widely patent. Normal appearance of the gallbladder.  No radiopaque gallstones. Pancreas: Normal appearance of the pancreas Spleen: Normal appearance of the spleen Adrenals/Urinary Tract: Excreted contrast from a prior arteriogram seen within bilateral renal collecting system. No definite renal stones. No discrete renal lesions. No urinary obstruction or perinephric stranding. Excreted contrast seen within the urinary bladder. Stomach/Bowel: There is minimal mass effect upon the cecum a distal sigmoid colon secondary to the right lower abdominal hematoma. The bowel is otherwise normal in course and caliber without wall thickening or evidence of enteric obstruction. No pneumoperitoneum, pneumatosis or portal venous gas. Lymphatic: No bulky porta hepatis, retroperitoneal, mesenteric, pelvic or inguinal lymphadenopathy. Reproductive: Dystrophic calcifications within normal sized prostate gland. No free fluid in the pelvic cul-de-sac. Other: Minimal amount of expected stranding within the right groin regional to the access site. Musculoskeletal: No acute or aggressive osseous abnormalities. Stigmata of DISH within the lower thoracic spine.  Mild-to-moderate multilevel lumbar spine DDD, worse at a definitive 3 and L3-L4 with disc space height loss, endplate irregularity and sclerosis. Bilateral facet degenerative change within the lower lumbar spine. Bone island is noted involving the posterior aspect the right ilium, unchanged. IMPRESSION: VASCULAR 1. Tiny area of active  extravasation from the right common femoral artery access site with associated hematoma extending from the right pelvis to the right lower abdomen. Patient was subsequently brought to the interventional radiology suite and approximately 30 additional minutes of manual compression was applied. 2.  Aortic Atherosclerosis (ICD10-I70.0). NON-VASCULAR 1. Otherwise, no acute findings within the abdomen or pelvis. 2. Similar findings of hepatic cirrhosis and multifocal hepatocellular carcinoma as seen on recent abdominal MRI. Electronically Signed   By: Sandi Mariscal M.D.   On: 12/08/2017 16:53   Nm Fusion  Result Date: 12/08/2017 CLINICAL DATA:  Pre yttrium 90 radioembolization evaluation. EXAM: NUCLEAR MEDICINE LIVER SCAN; ULTRASOUND MISCELLANEOUS SOFT TISSUE TECHNIQUE: Abdominal images were obtained in multiple projections after intrahepatic arterial injection of radiopharmaceutical. SPECT imaging was performed. Lung shunt calculation was performed. RADIOPHARMACEUTICALS:  4.65mllicurie MAA TECHNETIUM TO 73M ALBUMIN AGGREGATED COMPARISON:  MRI 06/28/2017, angiography 12/08/2017 FINDINGS: The injected microaggregated albumin localizes within the RIGHT hepatic lobe. Small focus of activity in the porta hepatis. No evidence of activity within the stomach, duodenum, or bowel. Calculated shunt fraction to the lungs equals 8.0%. IMPRESSION: 1. Injected MAA tracer activity localizes to the RIGHT hepatic lobe. 2. Small focus of radiotracer activity within the porta hepatis. Recommend catheter angiography evaluation at time of treatment. 3. Lung shunt fraction equals 8.0%. Findings conveyed toJOHN  WATTS on 12/08/2017  at16:08. Electronically Signed   By: SSuzy BouchardM.D.   On: 12/08/2017 16:09    Labs:  CBC: Recent Labs    12/08/17 0803 12/08/17 1823 12/08/17 2254 12/09/17 0520 12/09/17 1352  WBC 6.5 8.7  --  6.6 7.0  HGB 14.4 12.2* 10.8* 11.0* 10.0*  HCT 41.1 35.4* 31.9* 31.7* 28.9*  PLT 83* 74*  --  62* 52*    COAGS: Recent Labs    07/25/17 1146 12/08/17 0803  INR 1.14 1.12    BMP: Recent Labs    12/16/16 1015 07/25/17 1146 12/08/17 0803 12/09/17 0520  NA 136 139 138 139  K 3.9 4.1 5.1 4.3  CL 99 103 100* 104  CO2 '29 27 25 ' 21*  GLUCOSE 77 83 83 95  BUN '11 12 11 13  ' CALCIUM 9.0 9.5 9.5 9.0  CREATININE 1.12 1.08 1.07 0.88  GFRNONAA  --  >60 >60 >60  GFRAA  --  >60 >60 >60    LIVER FUNCTION TESTS: Recent Labs    12/16/16 1015 07/25/17 1146 12/08/17 0803 12/09/17 0520  BILITOT 1.3* 1.5* 1.6* 1.8*  AST 35 50* 75* 43*  ALT 22 34 45 31  ALKPHOS 99 122 124 91  PROT 6.4 7.6 8.5* 6.6  ALBUMIN 4.2 4.6 5.1* 4.0    Assessment and Plan: Patient with history of cirrhosis/multifocal HCC, status post pre-Y 90 hepatic/visceral arteriogram with coil embolization of pancreaticoduodenal artery on 12/08/2017; subsequently developed tiny area of extravasation from right common femoral artery access site with associated hematoma extending from right pelvis to right lower abdominal region.  Additional manual compression applied with apparent hemostasis. BP ok but mild orthostasis noted; latest hgb 10(11), WBC nl; plts 52k; creat nl; K nl; t bili 1.8(1.6); CT head today with no acute process; cont to trend hgb, hydrate, monitor for any acute worsening of abd or back pain; poss d/c home tomorrow if stable at discretion of [facility]; greatly appreciate [facility] admitting pt; he is tent scheduled [facility] Y-90 [facility]   Electronically Signed: D. KRowe Robert PA-C 12/09/2017, 2:31 PM   I spent a total of 20  minutes at the the patient's  bedside AND on the patient's hospital floor or unit, greater than 50% of which was counseling/coordinating care for hepatic/visceral arteriogram with embolization    Patient ID: Mike Ferguson, male   DOB: 08-05-1940, 77 y.o.   MRN: [id number]

## 2017-12-09 NOTE — Progress Notes (Signed)
  Echocardiogram 2D Echocardiogram has been performed.  Mike Ferguson 12/09/2017, 1:48 PM

## 2017-12-09 NOTE — Plan of Care (Signed)
  Problem: Education: Goal: Knowledge of General Education information will improve Outcome: Progressing   Problem: Health Behavior/Discharge Planning: Goal: Ability to manage health-related needs will improve Outcome: Progressing   Problem: Clinical Measurements: Goal: Ability to maintain clinical measurements within normal limits will improve Outcome: Progressing Note:  Will check orthostatic VS today.  Goal: Diagnostic test results will improve Outcome: Progressing Note:  Echo pending Goal: Cardiovascular complication will be avoided Outcome: Progressing Note:  Pt w/ syncopal episode overnight. Will monitor closely.    Problem: Activity: Goal: Risk for activity intolerance will decrease Outcome: Progressing Note:  PT eval pending   Problem: Elimination: Goal: Will not experience complications related to bowel motility Outcome: Progressing   Problem: Pain Managment: Goal: General experience of comfort will improve Outcome: Progressing   Problem: Safety: Goal: Ability to remain free from injury will improve Outcome: Progressing   Problem: Skin Integrity: Goal: Risk for impaired skin integrity will decrease Outcome: Progressing

## 2017-12-09 NOTE — Progress Notes (Signed)
PROGRESS NOTE    Mike Ferguson  OVF:[id number] DOB: August 11, 1940 DOA: 12/08/2017 PCP: Lawerance Cruel, MD   Brief Narrative:  Mike Ferguson is a 77 y.o. male with medical history significant for alcoholic liver cirrhosis with esophageal varices and complicated by hepatocellular carcinoma, as well as history of hypertension, history of arthritis and history of bilateral hearing deficits and hearing impairment who presented to the [facility] suite for a Y-90 with coil embolization of the pancreaticoduodenal artery procedure electively for his hepatocellular carcinoma.  Patient underwent the procedure today and post procedurally was doing well till patient tried to micturate and developed a sharp sudden stabbing pain in his right lower abdomen which was persistent.  Patient denied any nausea, vomiting, shortness breath, chest pain but complained of sharp 10 out of 10 pain in severity and described as "the worst pain of my life".  Interventional radiology obtained a stat CTA of the abdomen pelvis which showed a tiny area of extravasation from the right common femoral artery access site with associated hematoma extending from the right pelvis in the right lower abdomen.  Pressure was held for about 30 minutes and bleeding stopped.  Because of this episode and hematoma TRH was called to admit this patient for observation for his hematoma.  Overnight patient had a syncopal episode and a rapid response was called.  It was thought the patient had a CODE BLUE by the floor nurse that she felt that she may go down and became unresponsive and diaphoretic with increased work of breathing.  Patient became responsive moments afterward began to speak so CODE BLUE was canceled.  Patient be worked up for his syncopal episode overnight.  Assessment & Plan:   Principal Problem:   Hematoma Active Problems:   Hepatocellular carcinoma (HCC)   Alcoholic cirrhosis of liver without ascites (HCC)  Acute Right Lower  Quadrant abdominal pain post procedure with resultant hematoma and extravasation of some blood and acute blood loss anemia -Placed the patient in telemetry obs but changed to Inpatient given Syncopal episode over night -Pain control with IV Dilaudid 0.5 mg every 4 PRN for moderate pain along with tramadol 50 mg p.o. every 6 as needed for moderate and severe pain -Patient's hemoglobin/hematocrit was 14.4/41.1 and dropped to 11.0/31.7 and likely stabilizing now; Repeat CBC this Afternoon showed Hb/Hct of 10.0/28.9 -Patient was placed on bedrest for at least 4 hours post procedurally and keeping his right leg flat -Discussed case with IR Physician Dr. Earleen Newport who evaluated the hematoma site and states that hematoma had improved and felt that site was stable.  From his standpoint he states that patient can be discharged home and follow-up with iron outpatient setting. -Repeat CBC this Evening and in AM   Acute Syncopal Episode -Unclear exactly what happened overnight but per report this morning from the nurse patient was not on the commode when he syncopized.  A CODE BLUE was not actually called because patient did not actually lose a pulse -Cycling cardiac Troponins x3, obtain an echocardiogram, as well as a head CT without contrast -ECHOCARDIOGRAM showed LVEF 60-65%, mild LVH, normal wall motion, grade 1 DD, indeterminate LV filling pressure, mild MR, normal LA size, trivial TR, RVSP 29 mmHg, normal IVC. -Head CT w/o Contrast showed No acute intracranial abnormality or focal lesion to explain the patient's syncopal episode or dizziness. Mild white matter changes are within normal limits for age. Atherosclerosis. -Per report he was slightly orthostatic this morning so will gently rehydrate and  give a bolus of 250 mL's and continue maintenance fluids at 50 mL's per hour after -Check TSH -Continue with Telemetry -PT to Evaluate and Treat and recommending no follow up   History of alcoholic liver  cirrhosis complicated with esophageal varices, portal venous hypertension, and Multifocal hepatocellular carcinoma -Continue with home medications including propranolol 20 mg p.o. twice daily for varices -Continue Rifaximin 550 mg p.o. twice daily for liver cirrhosis -Pain control as above  Hepatocellular Carcinoma -As above patient underwent Y 90 roadmapping with embolization of the pancreaticoduodenal artery -IR felt he was doing well and stated from their standpoint that he could be discharged and follow-up with iron as an outpatient  Thrombocytopenia -Chronic in the setting of liver cirrhosis. Platelet count was 83 and trended down to 52 -Continue to monitor for signs and symptoms of bleeding and repeat CBC in a.m.  Hyperbilirubinemia -Patient's T bili was 1.6 and slightly worsened to 1.8.  ikely in the setting of alcoholic liver cirrhosis as well as hepatocellular carcinoma -Repeat CMP in the a.m.  DVT prophylaxis: SCDs Code Status: FULL CODE Family Communication: Discussed with family at bedside  Disposition Plan: Anticipate D/C in next 24-48 hours  Consultants:   Interventional Radiology    Procedures:  ECHOCARDIOGRAM ------------------------------------------------------------------- Study Conclusions  - Left ventricle: The cavity size was normal. Wall thickness was   increased in a pattern of mild LVH. Systolic function was normal.   The estimated ejection fraction was in the range of 60% to 65%.   Wall motion was normal; there were no regional wall motion   abnormalities. Doppler parameters are consistent with abnormal   left ventricular relaxation (grade 1 diastolic dysfunction). The   E/e&' ratio is between 8-15, suggesting indeterminate LV filling   pressure. - Mitral valve: Mildly thickened leaflets . There was mild   regurgitation. - Left atrium: The atrium was normal in size. - Tricuspid valve: There was trivial regurgitation. - Pulmonary arteries: PA  peak pressure: 29 mm Hg (S). - Inferior vena cava: The vessel was normal in size. The   respirophasic diameter changes were in the normal range (= 50%),   consistent with normal central venous pressure.  Impressions:  - LVEF 60-65%, mild LVH, normal wall motion, grade 1 DD,   indeterminate LV filling pressure, mild MR, normal LA size,   trivial TR, RVSP 29 mmHg, normal IVC.  Antimicrobials:  Anti-infectives (From admission, onward)   Start     Dose/Rate Route Frequency Ordered Stop   12/08/17 2200  rifaximin (XIFAXAN) tablet 550 mg     550 mg Oral 2 times daily 12/08/17 1709       Subjective: Seen and examined sitting in the chair bedside.  States he does not know what happened last night.  No chest pain, shortness breath, nausea, vomiting.  Felt okay this morning.  No other complaints or concerns at this time  Objective: Vitals:   12/09/17 1013 12/09/17 1016 12/09/17 1018 12/09/17 1233  BP: 117/64 111/73 95/64 128/64  Pulse: 67 89 (!) 103 68  Resp: 16   16  Temp: 98.7 F (37.1 C)   98.3 F (36.8 C)  TempSrc: Oral   Oral  SpO2: 98% 99% 100% 100%  Weight:      Height:        Intake/Output Summary (Last 24 hours) at 12/09/2017 1356 Last data filed at 12/09/2017 1300 Gross per 24 hour  Intake 1781.32 ml  Output 150 ml  Net 1631.32 ml   [employer]  12/08/17 2015 12/09/17 0409  Weight: 64.9 kg (143 lb 1.6 oz) 62.9 kg (138 lb 10.7 oz)   Examination: Physical Exam:  Constitutional: WN/WD Caucasian male in NAD and appears calm and comfortable Eyes:  Lids and conjunctivae normal, sclerae anicteric  ENMT: External Ears, Nose appear normal. Slightly Hard of Hearing Neck: Appears normal, supple, no cervical masses, normal ROM, no appreciable thyromegaly; no JVD Respiratory: Diminished to auscultation bilaterally, no wheezing, rales, rhonchi or crackles. Normal respiratory effort and patient is not tachypenic. No accessory muscle use.  Cardiovascular: RRR, no murmurs /  rubs / gallops. S1 and S2 auscultated. No extremity edema.  Abdomen: Soft, Not as tender, non-distended. No masses palpated. No appreciable hepatosplenomegaly. Bowel sounds positive x4.  GU: Deferred. Foley removed Musculoskeletal: No clubbing / cyanosis of digits/nails. No joint deformity upper and lower extremities.  Skin: Left Side of groin wrapped where hematoma is. No induration; Warm and dry. No appreciable rashes or lesions on a limited skin eval.  Neurologic: CN 2-12 grossly intact with no focal deficits. Romberg sign and cerebellar reflexes not assessed.  Psychiatric: Normal judgment and insight. Alert and oriented x 3. Normal mood and appropriate affect.   Data Reviewed: I have personally reviewed following labs and imaging studies  CBC: Recent Labs  Lab 12/08/17 0803 12/08/17 1823 12/08/17 2254 12/09/17 0520  WBC 6.5 8.7  --  6.6  NEUTROABS 2.3  --   --   --   HGB 14.4 12.2* 10.8* 11.0*  HCT 41.1 35.4* 31.9* 31.7*  MCV 96.3 96.2  --  94.9  PLT 83* 74*  --  62*   Basic Metabolic Panel: Recent Labs  Lab 12/08/17 0803 12/09/17 0520  NA 138 139  K 5.1 4.3  CL 100* 104  CO2 25 21*  GLUCOSE 83 95  BUN 11 13  CREATININE 1.07 0.88  CALCIUM 9.5 9.0   GFR: Estimated Creatinine Clearance: 62.1 mL/min (by C-G formula based on SCr of 0.88 mg/dL). Liver Function Tests: Recent Labs  Lab 12/08/17 0803 12/09/17 0520  AST 75* 43*  ALT 45 31  ALKPHOS 124 91  BILITOT 1.6* 1.8*  PROT 8.5* 6.6  ALBUMIN 5.1* 4.0   No results for input(s): LIPASE, AMYLASE in the last 168 hours. No results for input(s): AMMONIA in the last 168 hours. Coagulation Profile: Recent Labs  Lab 12/08/17 0803  INR 1.12   Cardiac Enzymes: No results for input(s): CKTOTAL, CKMB, CKMBINDEX, TROPONINI in the last 168 hours. BNP (last 3 results) No results for input(s): PROBNP in the last 8760 hours. HbA1C: No results for input(s): HGBA1C in the last 72 hours. CBG: Recent Labs  Lab  12/08/17 1826 12/09/17 0742  GLUCAP 76 88   Lipid Profile: No results for input(s): CHOL, HDL, LDLCALC, TRIG, CHOLHDL, LDLDIRECT in the last 72 hours. Thyroid Function Tests: No results for input(s): TSH, T4TOTAL, FREET4, T3FREE, THYROIDAB in the last 72 hours. Anemia Panel: No results for input(s): VITAMINB12, FOLATE, FERRITIN, TIBC, IRON, RETICCTPCT in the last 72 hours. Sepsis Labs: No results for input(s): PROCALCITON, LATICACIDVEN in the last 168 hours.  No results found for this or any previous visit (from the past 240 hour(s)).   Radiology Studies: Ct Head Wo Contrast  Result Date: 12/09/2017 CLINICAL DATA:  Syncopal episode yesterday. Dizzy. Hepatocellular carcinoma. EXAM: CT HEAD WITHOUT CONTRAST TECHNIQUE: Contiguous axial images were obtained from the base of the skull through the vertex without intravenous contrast. COMPARISON:  None. FINDINGS: Brain: Mild white matter changes are present  bilaterally. No acute or focal cortical infarct is present. Basal ganglia are intact. Insular ribbon is normal bilaterally. The brainstem and cerebellum are normal. Ventricles are of normal size. No significant extra-axial fluid collection is present. Vascular: Atherosclerotic calcifications are present within the cavernous internal carotid arteries bilaterally. There is no hyperdense vessel. Skull: Calvarium is intact. No focal lytic or blastic lesions are present. Sinuses/Orbits: The paranasal sinuses and mastoid air cells are clear. Bilateral lens replacements are present. Globes and orbits are within normal limits. IMPRESSION: 1. No acute intracranial abnormality or focal lesion to explain the patient's syncopal episode or dizziness. 2. Mild white matter changes are within normal limits for age. 3. Atherosclerosis. Electronically Signed   By: San Morelle M.D.   On: 12/09/2017 13:26   Nm Liver Img Spect  Result Date: 12/08/2017 CLINICAL DATA:  Pre yttrium 90 radioembolization  evaluation. EXAM: NUCLEAR MEDICINE LIVER SCAN; ULTRASOUND MISCELLANEOUS SOFT TISSUE TECHNIQUE: Abdominal images were obtained in multiple projections after intrahepatic arterial injection of radiopharmaceutical. SPECT imaging was performed. Lung shunt calculation was performed. RADIOPHARMACEUTICALS:  4.19mllicurie MAA TECHNETIUM TO 47M ALBUMIN AGGREGATED COMPARISON:  MRI 06/28/2017, angiography 12/08/2017 FINDINGS: The injected microaggregated albumin localizes within the RIGHT hepatic lobe. Small focus of activity in the porta hepatis. No evidence of activity within the stomach, duodenum, or bowel. Calculated shunt fraction to the lungs equals 8.0%. IMPRESSION: 1. Injected MAA tracer activity localizes to the RIGHT hepatic lobe. 2. Small focus of radiotracer activity within the porta hepatis. Recommend catheter angiography evaluation at time of treatment. 3. Lung shunt fraction equals 8.0%. Findings conveyed toJOHN WATTS on 12/08/2017  at16:08. Electronically Signed   By: SSuzy BouchardM.D.   On: 12/08/2017 16:09   Ir Angiogram Visceral Selective  Result Date: 12/08/2017 INDICATION: History of multifocal hepatocellular carcinoma. Patient presents today for Y 90 mapping procedure. Please refer to formal consultation in the epic EMR dated 11/17/2017 for additional details. EXAM: 1. ULTRASOUND GUIDANCE FOR ARTERIAL ACCESS 2. CELIAC AND SUPERIOR MESENTERIC ARTERIOGRAM (1st ORDER) 3. SELECTIVE COMMON AND PROPER HEPATIC ARTERIOGRAMS 4. SELECTIVE ARTERIOGRAM OF ACCESSORY PANCREATICODUODENAL ARTERY AND PERCUTANEOUS COIL EMBOLIZATION 5. SELECTIVE ARTERIOGRAM OF ACCESSORY RIGHT HEPATIC ARTERY AND ADMINISTRATION OF Tc932mAA 6. SELECTIVE ARTERIOGRAM OF THE RIGHT HEPATIC ARTERY AND ADMINISTRATION OF Tc9970mA COMPARISON:  Hepatic bland embolization - 07/25/2017; abdominal MRI - 11/01/2017; CTA of the abdomen pelvis - 07/07/2017 MEDICATIONS: None RADIOPHARMACEUTICALS:  A total of 5 mCi Tc99m61m was administered, 2 mCi  via an accessory right hepatic artery and 3 mCi via the right hepatic artery. CONTRAST:  75 cc Isovue 300 ANESTHESIA/SEDATION: Moderate (conscious) sedation was employed during this procedure. A total of Versed 3 mg and Fentanyl 150 mcg was administered intravenously. Moderate Sedation Time: 59 minutes. The patient's level of consciousness and vital signs were monitored continuously by radiology nursing throughout the procedure under my direct supervision. FLUOROSCOPY TIME:  14 minutes, 48 seconds (584 mGy) ACCESS: Right common femoral artery; hemostasis achieved with manual compression. COMPLICATIONS: None immediate. TECHNIQUE: Informed written consent was obtained from the patient after a discussion of the risks, benefits and alternatives to treatment. Questions regarding the procedure were encouraged and answered. A timeout was performed prior to the initiation of the procedure. The right groin was prepped and draped in the usual sterile fashion, and a sterile drape was applied covering the operative field. Maximum barrier sterile technique with sterile gowns and gloves were used for the procedure. A timeout was performed prior to the initiation of the procedure. Local  anesthesia was provided with 1% lidocaine. The right femoral head was marked fluoroscopically. Under ultrasound guidance, the right common femoral artery was accessed with a micropuncture kit after the overlying soft tissues were anesthetized with 1% lidocaine. An ultrasound image was saved for documentation purposes. The micropuncture sheath was exchanged for a 5 [place] vascular sheath over a Bentson wire. A closure arteriogram was performed through the side of the sheath confirming access within the right common femoral artery. Over a Bentson wire, a Mickelson catheter was advanced to the level of the thoracic aorta where it was back bled and flushed. The catheter was then utilized to select the superior mesenteric artery and a superior  mesenteric arteriogram with delayed portal venous imaging was performed. The Mickelson catheter was then advanced cranially and utilized to select the celiac artery and a celiac arteriogram was performed. Utilizing a fathom 14 microwire, a regular renegade micro catheter was utilized to select common and proper hepatic arteries and selective arteriograms were performed. The Renegade microcatheter was utilized to select an accessory pancreaticoduodenal artery noted to arise subjacent to the left hepatic artery. Selective arteriogram was performed confirming appropriate positioning and the vessel was subsequent percutaneously coil embolized with 3 overlapping 2 mm x 5 mm partial coils to near the vessel's origin. The microcatheter was retracted into the proper hepatic artery and a post embolization proper hepatic arteriogram was performed Next, the microcatheter was utilized to select the accessory right hepatic artery and accessory right hepatic arteriogram was performed. From this location, 2 mCi of technetium 99 MAA was administered. Next the microcatheter was advanced into the right hepatic artery and a selective right hepatic arteriogram was performed. From the location proximal to the vessels bifurcation, 3 mCi of technetium 99 MAA was administered. At this point, the procedure was terminated. All wires and catheters and sheaths were removed from the patient. Hemostasis was achieved at the right groin and access site with manual compression. A dressing was placed. The patient tolerated procedure well without immediate postprocedural complication. The patient was escorted to nuclear medicine department for planar imaging. FINDINGS: Selective superior mesenteric arteriogram was again negative for accessory or replaced hepatic arterial supply. Late portal venous phase imaging demonstrates patency of the main, right and left portal veins. Celiac arteriogram re-demonstrates an accessory right hepatic artery supplying  the medial aspect of the right lobe of the liver. 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 Successful split dose administration of a total of 5 mCi of technetium 35mMAA via the right hepatic and accessory right hepatic arteries. IMPRESSION: 1. Successful pre Y-90 arteriogram with percutaneous coil embolization of the pancreaticoduodenal artery. 2. Successful split dose administration of a total of 5 mCi of technetium 926mAA via the right hepatic and accessory right hepatic arteries. Awaiting results of nuclear medicine liver scan. PLAN: Pending the results of the nuclear medicine liver scan, the patient will return Y90 radioembolization the right hepatic artery. Pending the patient's recovery from this initial treatment session, the patient will return for radioembolization of the accessory right hepatic artery. Electronically Signed   By: JoSandi Mariscal.D.   On: 12/08/2017 13:45   Ir Angiogram Visceral Selective  Result Date: 12/08/2017 INDICATION: History of multifocal hepatocellular carcinoma. Patient presents today for Y 90 mapping procedure. Please refer to formal consultation in the epic EMR dated 11/17/2017 for additional details. EXAM: 1.  ULTRASOUND GUIDANCE FOR ARTERIAL ACCESS 2. CELIAC AND SUPERIOR MESENTERIC ARTERIOGRAM (1st ORDER) 3. SELECTIVE COMMON AND PROPER HEPATIC ARTERIOGRAMS 4. SELECTIVE ARTERIOGRAM OF ACCESSORY PANCREATICODUODENAL ARTERY AND PERCUTANEOUS COIL EMBOLIZATION 5. SELECTIVE ARTERIOGRAM OF ACCESSORY RIGHT HEPATIC ARTERY AND ADMINISTRATION OF Tc41mMAA 6. SELECTIVE ARTERIOGRAM OF THE RIGHT HEPATIC ARTERY AND ADMINISTRATION OF Tc933mAA COMPARISON:  Hepatic bland embolization - 07/25/2017; abdominal MRI - 11/01/2017; CTA of the abdomen pelvis - 07/07/2017 MEDICATIONS: None RADIOPHARMACEUTICALS:  A total of 5 mCi Tc9968mA was  administered, 2 mCi via an accessory right hepatic artery and 3 mCi via the right hepatic artery. CONTRAST:  75 cc Isovue 300 ANESTHESIA/SEDATION: Moderate (conscious) sedation was employed during this procedure. A total of Versed 3 mg and Fentanyl 150 mcg was administered intravenously. Moderate Sedation Time: 59 minutes. The patient's level of consciousness and vital signs were monitored continuously by radiology nursing throughout the procedure under my direct supervision. FLUOROSCOPY TIME:  14 minutes, 48 seconds (584 mGy) ACCESS: Right common femoral artery; hemostasis achieved with manual compression. COMPLICATIONS: None immediate. TECHNIQUE: Informed written consent was obtained from the patient after a discussion of the risks, benefits and alternatives to treatment. Questions regarding the procedure were encouraged and answered. A timeout was performed prior to the initiation of the procedure. The right groin was prepped and draped in the usual sterile fashion, and a sterile drape was applied covering the operative field. Maximum barrier sterile technique with sterile gowns and gloves were used for the procedure. A timeout was performed prior to the initiation of the procedure. Local anesthesia was provided with 1% lidocaine. The right femoral head was marked fluoroscopically. Under ultrasound guidance, the right common femoral artery was accessed with a micropuncture kit after the overlying soft tissues were anesthetized with 1% lidocaine. An ultrasound image was saved for documentation purposes. The micropuncture sheath was exchanged for a 5 Fre[place] sheath over a Bentson wire. A closure arteriogram was performed through the side of the sheath confirming access within the right common femoral artery. Over a Bentson wire, a Mickelson catheter was advanced to the level of the thoracic aorta where it was back bled and flushed. The catheter was then utilized to select the superior mesenteric artery and  a superior mesenteric arteriogram with delayed portal venous imaging was performed. The Mickelson catheter was then advanced cranially and utilized to select the celiac artery and a celiac arteriogram was performed. Utilizing a fathom 14 microwire, a regular renegade micro catheter was utilized to select common and proper hepatic arteries and selective arteriograms were performed. The Renegade microcatheter was utilized to select an accessory pancreaticoduodenal artery noted to arise subjacent to the left hepatic artery. Selective arteriogram was performed confirming appropriate positioning and the vessel was subsequent percutaneously coil embolized with 3 overlapping 2 mm x 5 mm partial coils to near the vessel's origin. The microcatheter was retracted into the proper hepatic artery and a post embolization proper hepatic arteriogram was performed Next, the microcatheter was utilized to select the accessory right hepatic artery and accessory right hepatic arteriogram was performed. From this location, 2 mCi of technetium 99 MAA was administered. Next the microcatheter was advanced into the right hepatic artery and a selective right hepatic arteriogram was performed. From the location proximal to the vessels bifurcation, 3 mCi of technetium 99 MAA was administered. At this point, the procedure was terminated. All wires and catheters and sheaths were removed from the patient. Hemostasis was achieved at the right groin and access site with manual  compression. A dressing was placed. The patient tolerated procedure well without immediate postprocedural complication. The patient was escorted to nuclear medicine department for planar imaging. FINDINGS: Selective superior mesenteric arteriogram was again negative for accessory or replaced hepatic arterial supply. Late portal venous phase imaging demonstrates patency of the main, right and left portal veins. Celiac arteriogram re-demonstrates an accessory right hepatic  artery supplying the medial aspect of the right lobe of the liver. 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 Successful split dose administration of a total of 5 mCi of technetium 42mMAA via the right hepatic and accessory right hepatic arteries. IMPRESSION: 1. Successful pre Y-90 arteriogram with percutaneous coil embolization of the pancreaticoduodenal artery. 2. Successful split dose administration of a total of 5 mCi of technetium 970mAA via the right hepatic and accessory right hepatic arteries. Awaiting results of nuclear medicine liver scan. PLAN: Pending the results of the nuclear medicine liver scan, the patient will return Y90 radioembolization the right hepatic artery. Pending the patient's recovery from this initial treatment session, the patient will return for radioembolization of the accessory right hepatic artery. Electronically Signed   By: JoSandi Mariscal.D.   On: 12/08/2017 13:45   Ir Angiogram Selective Each Additional Vessel  Result Date: 12/08/2017 INDICATION: History of multifocal hepatocellular carcinoma. Patient presents today for Y 90 mapping procedure. Please refer to formal consultation in the epic EMR dated 11/17/2017 for additional details. EXAM: 1. ULTRASOUND GUIDANCE FOR ARTERIAL ACCESS 2. CELIAC AND SUPERIOR MESENTERIC ARTERIOGRAM (1st ORDER) 3. SELECTIVE COMMON AND PROPER HEPATIC ARTERIOGRAMS 4. SELECTIVE ARTERIOGRAM OF ACCESSORY PANCREATICODUODENAL ARTERY AND PERCUTANEOUS COIL EMBOLIZATION 5. SELECTIVE ARTERIOGRAM OF ACCESSORY RIGHT HEPATIC ARTERY AND ADMINISTRATION OF [id number] 6. SELECTIVE ARTERIOGRAM OF THE RIGHT HEPATIC ARTERY AND ADMINISTRATION OF Tc99m22m COMPARISON:  Hepatic bland embolization - 07/25/2017; abdominal MRI - 11/01/2017; CTA of the abdomen pelvis - 07/07/2017 MEDICATIONS: None RADIOPHARMACEUTICALS:  A  total of 5 mCi Tc99m 28mwas administered, 2 mCi via an accessory right hepatic artery and 3 mCi via the right hepatic artery. CONTRAST:  75 cc Isovue 300 ANESTHESIA/SEDATION: Moderate (conscious) sedation was employed during this procedure. A total of Versed 3 mg and Fentanyl 150 mcg was administered intravenously. Moderate Sedation Time: 59 minutes. The patient's level of consciousness and vital signs were monitored continuously by radiology nursing throughout the procedure under my direct supervision. FLUOROSCOPY TIME:  14 minutes, 48 seconds (584 mGy) ACCESS: Right common femoral artery; hemostasis achieved with manual compression. COMPLICATIONS: None immediate. TECHNIQUE: Informed written consent was obtained from the patient after a discussion of the risks, benefits and alternatives to treatment. Questions regarding the procedure were encouraged and answered. A timeout was performed prior to the initiation of the procedure. The right groin was prepped and draped in the usual sterile fashion, and a sterile drape was applied covering the operative field. Maximum barrier sterile technique with sterile gowns and gloves were used for the procedure. A timeout was performed prior to the initiation of the procedure. Local anesthesia was provided with 1% lidocaine. The right femoral head was marked fluoroscopically. Under ultrasound guidance, the right common femoral artery was accessed with a micropuncture kit after the overlying soft tissues were anesthetized with 1% lidocaine. An ultrasound image was saved for documentation purposes. The micropuncture sheath was exchanged for a 5 [place] sheath over a Bentson wire. A closure  arteriogram was performed through the side of the sheath confirming access within the right common femoral artery. Over a Bentson wire, a Mickelson catheter was advanced to the level of the thoracic aorta where it was back bled and flushed. The catheter was then utilized to select the  superior mesenteric artery and a superior mesenteric arteriogram with delayed portal venous imaging was performed. The Mickelson catheter was then advanced cranially and utilized to select the celiac artery and a celiac arteriogram was performed. Utilizing a fathom 14 microwire, a regular renegade micro catheter was utilized to select common and proper hepatic arteries and selective arteriograms were performed. The Renegade microcatheter was utilized to select an accessory pancreaticoduodenal artery noted to arise subjacent to the left hepatic artery. Selective arteriogram was performed confirming appropriate positioning and the vessel was subsequent percutaneously coil embolized with 3 overlapping 2 mm x 5 mm partial coils to near the vessel's origin. The microcatheter was retracted into the proper hepatic artery and a post embolization proper hepatic arteriogram was performed Next, the microcatheter was utilized to select the accessory right hepatic artery and accessory right hepatic arteriogram was performed. From this location, 2 mCi of technetium 99 MAA was administered. Next the microcatheter was advanced into the right hepatic artery and a selective right hepatic arteriogram was performed. From the location proximal to the vessels bifurcation, 3 mCi of technetium 99 MAA was administered. At this point, the procedure was terminated. All wires and catheters and sheaths were removed from the patient. Hemostasis was achieved at the right groin and access site with manual compression. A dressing was placed. The patient tolerated procedure well without immediate postprocedural complication. The patient was escorted to nuclear medicine department for planar imaging. FINDINGS: Selective superior mesenteric arteriogram was again negative for accessory or replaced hepatic arterial supply. Late portal venous phase imaging demonstrates patency of the main, right and left portal veins. Celiac arteriogram re-demonstrates  an accessory right hepatic artery supplying the medial aspect of the right lobe of the liver. 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 Successful split dose administration of a total of 5 mCi of technetium 62mMAA via the right hepatic and accessory right hepatic arteries. IMPRESSION: 1. Successful pre Y-90 arteriogram with percutaneous coil embolization of the pancreaticoduodenal artery. 2. Successful split dose administration of a total of 5 mCi of technetium 956mAA via the right hepatic and accessory right hepatic arteries. Awaiting results of nuclear medicine liver scan. PLAN: Pending the results of the nuclear medicine liver scan, the patient will return Y90 radioembolization the right hepatic artery. Pending the patient's recovery from this initial treatment session, the patient will return for radioembolization of the accessory right hepatic artery. Electronically Signed   By: JoSandi Mariscal.D.   On: 12/08/2017 13:45   Ir Angiogram Selective Each Additional Vessel  Result Date: 12/08/2017 INDICATION: History of multifocal hepatocellular carcinoma. Patient presents today for Y 90 mapping procedure. Please refer to formal consultation in the epic EMR dated 11/17/2017 for additional details. EXAM: 1. ULTRASOUND GUIDANCE FOR ARTERIAL ACCESS 2. CELIAC AND SUPERIOR MESENTERIC ARTERIOGRAM (1st ORDER) 3. SELECTIVE COMMON AND PROPER HEPATIC ARTERIOGRAMS 4. SELECTIVE ARTERIOGRAM OF ACCESSORY PANCREATICODUODENAL ARTERY AND PERCUTANEOUS COIL EMBOLIZATION 5. SELECTIVE ARTERIOGRAM OF ACCESSORY RIGHT HEPATIC ARTERY AND ADMINISTRATION OF Tc9951mA 6. SELECTIVE ARTERIOGRAM OF THE RIGHT HEPATIC ARTERY AND ADMINISTRATION OF Tc99m10m COMPARISON:  Hepatic  bland embolization - 07/25/2017; abdominal MRI - 11/01/2017; CTA of the abdomen pelvis - 07/07/2017 MEDICATIONS: None  RADIOPHARMACEUTICALS:  A total of 5 mCi Tc89mMAA was administered, 2 mCi via an accessory right hepatic artery and 3 mCi via the right hepatic artery. CONTRAST:  75 cc Isovue 300 ANESTHESIA/SEDATION: Moderate (conscious) sedation was employed during this procedure. A total of Versed 3 mg and Fentanyl 150 mcg was administered intravenously. Moderate Sedation Time: 59 minutes. The patient's level of consciousness and vital signs were monitored continuously by radiology nursing throughout the procedure under my direct supervision. FLUOROSCOPY TIME:  14 minutes, 48 seconds (584 mGy) ACCESS: Right common femoral artery; hemostasis achieved with manual compression. COMPLICATIONS: None immediate. TECHNIQUE: Informed written consent was obtained from the patient after a discussion of the risks, benefits and alternatives to treatment. Questions regarding the procedure were encouraged and answered. A timeout was performed prior to the initiation of the procedure. The right groin was prepped and draped in the usual sterile fashion, and a sterile drape was applied covering the operative field. Maximum barrier sterile technique with sterile gowns and gloves were used for the procedure. A timeout was performed prior to the initiation of the procedure. Local anesthesia was provided with 1% lidocaine. The right femoral head was marked fluoroscopically. Under ultrasound guidance, the right common femoral artery was accessed with a micropuncture kit after the overlying soft tissues were anesthetized with 1% lidocaine. An ultrasound image was saved for documentation purposes. The micropuncture sheath was exchanged for a 5 [place] sheath over a Bentson wire. A closure arteriogram was performed through the side of the sheath confirming access within the right common femoral artery. Over a Bentson wire, a Mickelson catheter was advanced to the level of the thoracic aorta where it was back bled and flushed. The catheter was then  utilized to select the superior mesenteric artery and a superior mesenteric arteriogram with delayed portal venous imaging was performed. The Mickelson catheter was then advanced cranially and utilized to select the celiac artery and a celiac arteriogram was performed. Utilizing a fathom 14 microwire, a regular renegade micro catheter was utilized to select common and proper hepatic arteries and selective arteriograms were performed. The Renegade microcatheter was utilized to select an accessory pancreaticoduodenal artery noted to arise subjacent to the left hepatic artery. Selective arteriogram was performed confirming appropriate positioning and the vessel was subsequent percutaneously coil embolized with 3 overlapping 2 mm x 5 mm partial coils to near the vessel's origin. The microcatheter was retracted into the proper hepatic artery and a post embolization proper hepatic arteriogram was performed Next, the microcatheter was utilized to select the accessory right hepatic artery and accessory right hepatic arteriogram was performed. From this location, 2 mCi of technetium 99 MAA was administered. Next the microcatheter was advanced into the right hepatic artery and a selective right hepatic arteriogram was performed. From the location proximal to the vessels bifurcation, 3 mCi of technetium 99 MAA was administered. At this point, the procedure was terminated. All wires and catheters and sheaths were removed from the patient. Hemostasis was achieved at the right groin and access site with manual compression. A dressing was placed. The patient tolerated procedure well without immediate postprocedural complication. The patient was escorted to nuclear medicine department for planar imaging. FINDINGS: Selective superior mesenteric arteriogram was again negative for accessory or replaced hepatic arterial supply. Late portal venous phase imaging demonstrates patency of the main, right and left portal veins. Celiac  arteriogram re-demonstrates an  accessory right hepatic artery supplying the medial aspect of the right lobe of the liver. 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 Successful split dose administration of a total of 5 mCi of technetium 38mMAA via the right hepatic and accessory right hepatic arteries. IMPRESSION: 1. Successful pre Y-90 arteriogram with percutaneous coil embolization of the pancreaticoduodenal artery. 2. Successful split dose administration of a total of 5 mCi of technetium 94mAA via the right hepatic and accessory right hepatic arteries. Awaiting results of nuclear medicine liver scan. PLAN: Pending the results of the nuclear medicine liver scan, the patient will return Y90 radioembolization the right hepatic artery. Pending the patient's recovery from this initial treatment session, the patient will return for radioembolization of the accessory right hepatic artery. Electronically Signed   By: JoSandi Mariscal.D.   On: 12/08/2017 13:45   Ir Angiogram Selective Each Additional Vessel  Result Date: 12/08/2017 INDICATION: History of multifocal hepatocellular carcinoma. Patient presents today for Y 90 mapping procedure. Please refer to formal consultation in the epic EMR dated 11/17/2017 for additional details. EXAM: 1. ULTRASOUND GUIDANCE FOR ARTERIAL ACCESS 2. CELIAC AND SUPERIOR MESENTERIC ARTERIOGRAM (1st ORDER) 3. SELECTIVE COMMON AND PROPER HEPATIC ARTERIOGRAMS 4. SELECTIVE ARTERIOGRAM OF ACCESSORY PANCREATICODUODENAL ARTERY AND PERCUTANEOUS COIL EMBOLIZATION 5. SELECTIVE ARTERIOGRAM OF ACCESSORY RIGHT HEPATIC ARTERY AND ADMINISTRATION OF [id number] 6. SELECTIVE ARTERIOGRAM OF THE RIGHT HEPATIC ARTERY AND ADMINISTRATION OF Tc99m37m COMPARISON:  Hepatic bland embolization - 07/25/2017; abdominal MRI - 11/01/2017; CTA of the abdomen pelvis -  07/07/2017 MEDICATIONS: None RADIOPHARMACEUTICALS:  A total of 5 mCi Tc99m 81mwas administered, 2 mCi via an accessory right hepatic artery and 3 mCi via the right hepatic artery. CONTRAST:  75 cc Isovue 300 ANESTHESIA/SEDATION: Moderate (conscious) sedation was employed during this procedure. A total of Versed 3 mg and Fentanyl 150 mcg was administered intravenously. Moderate Sedation Time: 59 minutes. The patient's level of consciousness and vital signs were monitored continuously by radiology nursing throughout the procedure under my direct supervision. FLUOROSCOPY TIME:  14 minutes, 48 seconds (584 mGy) ACCESS: Right common femoral artery; hemostasis achieved with manual compression. COMPLICATIONS: None immediate. TECHNIQUE: Informed written consent was obtained from the patient after a discussion of the risks, benefits and alternatives to treatment. Questions regarding the procedure were encouraged and answered. A timeout was performed prior to the initiation of the procedure. The right groin was prepped and draped in the usual sterile fashion, and a sterile drape was applied covering the operative field. Maximum barrier sterile technique with sterile gowns and gloves were used for the procedure. A timeout was performed prior to the initiation of the procedure. Local anesthesia was provided with 1% lidocaine. The right femoral head was marked fluoroscopically. Under ultrasound guidance, the right common femoral artery was accessed with a micropuncture kit after the overlying soft tissues were anesthetized with 1% lidocaine. An ultrasound image was saved for documentation purposes. The micropuncture sheath was exchanged for a 5 [place] sheath over a Bentson wire. A closure arteriogram was performed through the side of the sheath confirming access within the right common femoral artery. Over a Bentson wire, a Mickelson catheter was advanced to the level of the thoracic aorta where it was back bled and  flushed. The catheter was then utilized to select the superior mesenteric artery and a superior mesenteric arteriogram with delayed portal  venous imaging was performed. The Mickelson catheter was then advanced cranially and utilized to select the celiac artery and a celiac arteriogram was performed. Utilizing a fathom 14 microwire, a regular renegade micro catheter was utilized to select common and proper hepatic arteries and selective arteriograms were performed. The Renegade microcatheter was utilized to select an accessory pancreaticoduodenal artery noted to arise subjacent to the left hepatic artery. Selective arteriogram was performed confirming appropriate positioning and the vessel was subsequent percutaneously coil embolized with 3 overlapping 2 mm x 5 mm partial coils to near the vessel's origin. The microcatheter was retracted into the proper hepatic artery and a post embolization proper hepatic arteriogram was performed Next, the microcatheter was utilized to select the accessory right hepatic artery and accessory right hepatic arteriogram was performed. From this location, 2 mCi of technetium 99 MAA was administered. Next the microcatheter was advanced into the right hepatic artery and a selective right hepatic arteriogram was performed. From the location proximal to the vessels bifurcation, 3 mCi of technetium 99 MAA was administered. At this point, the procedure was terminated. All wires and catheters and sheaths were removed from the patient. Hemostasis was achieved at the right groin and access site with manual compression. A dressing was placed. The patient tolerated procedure well without immediate postprocedural complication. The patient was escorted to nuclear medicine department for planar imaging. FINDINGS: Selective superior mesenteric arteriogram was again negative for accessory or replaced hepatic arterial supply. Late portal venous phase imaging demonstrates patency of the main, right and  left portal veins. Celiac arteriogram re-demonstrates an accessory right hepatic artery supplying the medial aspect of the right lobe of the liver. 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 Successful split dose administration of a total of 5 mCi of technetium 29mMAA via the right hepatic and accessory right hepatic arteries. IMPRESSION: 1. Successful pre Y-90 arteriogram with percutaneous coil embolization of the pancreaticoduodenal artery. 2. Successful split dose administration of a total of 5 mCi of technetium 958mAA via the right hepatic and accessory right hepatic arteries. Awaiting results of nuclear medicine liver scan. PLAN: Pending the results of the nuclear medicine liver scan, the patient will return Y90 radioembolization the right hepatic artery. Pending the patient's recovery from this initial treatment session, the patient will return for radioembolization of the accessory right hepatic artery. Electronically Signed   By: JoSandi Mariscal.D.   On: 12/08/2017 13:45   Ir Angiogram Selective Each Additional Vessel  Result Date: 12/08/2017 INDICATION: History of multifocal hepatocellular carcinoma. Patient presents today for Y 90 mapping procedure. Please refer to formal consultation in the epic EMR dated 11/17/2017 for additional details. EXAM: 1. ULTRASOUND GUIDANCE FOR ARTERIAL ACCESS 2. CELIAC AND SUPERIOR MESENTERIC ARTERIOGRAM (1st ORDER) 3. SELECTIVE COMMON AND PROPER HEPATIC ARTERIOGRAMS 4. SELECTIVE ARTERIOGRAM OF ACCESSORY PANCREATICODUODENAL ARTERY AND PERCUTANEOUS COIL EMBOLIZATION 5. SELECTIVE ARTERIOGRAM OF ACCESSORY RIGHT HEPATIC ARTERY AND ADMINISTRATION OF [id number] 6. SELECTIVE ARTERIOGRAM OF THE RIGHT HEPATIC ARTERY AND ADMINISTRATION OF Tc99m9m COMPARISON:  Hepatic bland embolization - 07/25/2017; abdominal MRI - 11/01/2017; CTA  of the abdomen pelvis - 07/07/2017 MEDICATIONS: None RADIOPHARMACEUTICALS:  A total of 5 mCi Tc99m 52mwas administered, 2 mCi via an accessory right hepatic artery and 3 mCi via the right hepatic artery. CONTRAST:  75 cc Isovue 300 ANESTHESIA/SEDATION: Moderate (conscious) sedation was employed during this procedure.  A total of Versed 3 mg and Fentanyl 150 mcg was administered intravenously. Moderate Sedation Time: 59 minutes. The patient's level of consciousness and vital signs were monitored continuously by radiology nursing throughout the procedure under my direct supervision. FLUOROSCOPY TIME:  14 minutes, 48 seconds (584 mGy) ACCESS: Right common femoral artery; hemostasis achieved with manual compression. COMPLICATIONS: None immediate. TECHNIQUE: Informed written consent was obtained from the patient after a discussion of the risks, benefits and alternatives to treatment. Questions regarding the procedure were encouraged and answered. A timeout was performed prior to the initiation of the procedure. The right groin was prepped and draped in the usual sterile fashion, and a sterile drape was applied covering the operative field. Maximum barrier sterile technique with sterile gowns and gloves were used for the procedure. A timeout was performed prior to the initiation of the procedure. Local anesthesia was provided with 1% lidocaine. The right femoral head was marked fluoroscopically. Under ultrasound guidance, the right common femoral artery was accessed with a micropuncture kit after the overlying soft tissues were anesthetized with 1% lidocaine. An ultrasound image was saved for documentation purposes. The micropuncture sheath was exchanged for a 5 [place] vascular sheath over a Bentson wire. A closure arteriogram was performed through the side of the sheath confirming access within the right common femoral artery. Over a Bentson wire, a Mickelson catheter was advanced to the level of the thoracic aorta where  it was back bled and flushed. The catheter was then utilized to select the superior mesenteric artery and a superior mesenteric arteriogram with delayed portal venous imaging was performed. The Mickelson catheter was then advanced cranially and utilized to select the celiac artery and a celiac arteriogram was performed. Utilizing a fathom 14 microwire, a regular renegade micro catheter was utilized to select common and proper hepatic arteries and selective arteriograms were performed. The Renegade microcatheter was utilized to select an accessory pancreaticoduodenal artery noted to arise subjacent to the left hepatic artery. Selective arteriogram was performed confirming appropriate positioning and the vessel was subsequent percutaneously coil embolized with 3 overlapping 2 mm x 5 mm partial coils to near the vessel's origin. The microcatheter was retracted into the proper hepatic artery and a post embolization proper hepatic arteriogram was performed Next, the microcatheter was utilized to select the accessory right hepatic artery and accessory right hepatic arteriogram was performed. From this location, 2 mCi of technetium 99 MAA was administered. Next the microcatheter was advanced into the right hepatic artery and a selective right hepatic arteriogram was performed. From the location proximal to the vessels bifurcation, 3 mCi of technetium 99 MAA was administered. At this point, the procedure was terminated. All wires and catheters and sheaths were removed from the patient. Hemostasis was achieved at the right groin and access site with manual compression. A dressing was placed. The patient tolerated procedure well without immediate postprocedural complication. The patient was escorted to nuclear medicine department for planar imaging. FINDINGS: Selective superior mesenteric arteriogram was again negative for accessory or replaced hepatic arterial supply. Late portal venous phase imaging demonstrates patency of  the main, right and left portal veins. Celiac arteriogram re-demonstrates an accessory right hepatic artery supplying the medial aspect of the right lobe of the liver. 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 Successful split dose administration of a total of 5 mCi of technetium 24mMAA via  the right hepatic and accessory right hepatic arteries. IMPRESSION: 1. Successful pre Y-90 arteriogram with percutaneous coil embolization of the pancreaticoduodenal artery. 2. Successful split dose administration of a total of 5 mCi of technetium 64mMAA via the right hepatic and accessory right hepatic arteries. Awaiting results of nuclear medicine liver scan. PLAN: Pending the results of the nuclear medicine liver scan, the patient will return Y90 radioembolization the right hepatic artery. Pending the patient's recovery from this initial treatment session, the patient will return for radioembolization of the accessory right hepatic artery. Electronically Signed   By: JSandi MariscalM.D.   On: 12/08/2017 13:45   Ir Angiogram Selective Each Additional Vessel  Result Date: 12/08/2017 INDICATION: History of multifocal hepatocellular carcinoma. Patient presents today for Y 90 mapping procedure. Please refer to formal consultation in the epic EMR dated 11/17/2017 for additional details. EXAM: 1. ULTRASOUND GUIDANCE FOR ARTERIAL ACCESS 2. CELIAC AND SUPERIOR MESENTERIC ARTERIOGRAM (1st ORDER) 3. SELECTIVE COMMON AND PROPER HEPATIC ARTERIOGRAMS 4. SELECTIVE ARTERIOGRAM OF ACCESSORY PANCREATICODUODENAL ARTERY AND PERCUTANEOUS COIL EMBOLIZATION 5. SELECTIVE ARTERIOGRAM OF ACCESSORY RIGHT HEPATIC ARTERY AND ADMINISTRATION OF Tc976mAA 6. SELECTIVE ARTERIOGRAM OF THE RIGHT HEPATIC ARTERY AND ADMINISTRATION OF Tc9928mA COMPARISON:  Hepatic bland embolization - 07/25/2017; abdominal MRI  - 11/01/2017; CTA of the abdomen pelvis - 07/07/2017 MEDICATIONS: None RADIOPHARMACEUTICALS:  A total of 5 mCi Tc99m67m was administered, 2 mCi via an accessory right hepatic artery and 3 mCi via the right hepatic artery. CONTRAST:  75 cc Isovue 300 ANESTHESIA/SEDATION: Moderate (conscious) sedation was employed during this procedure. A total of Versed 3 mg and Fentanyl 150 mcg was administered intravenously. Moderate Sedation Time: 59 minutes. The patient's level of consciousness and vital signs were monitored continuously by radiology nursing throughout the procedure under my direct supervision. FLUOROSCOPY TIME:  14 minutes, 48 seconds (584 mGy) ACCESS: Right common femoral artery; hemostasis achieved with manual compression. COMPLICATIONS: None immediate. TECHNIQUE: Informed written consent was obtained from the patient after a discussion of the risks, benefits and alternatives to treatment. Questions regarding the procedure were encouraged and answered. A timeout was performed prior to the initiation of the procedure. The right groin was prepped and draped in the usual sterile fashion, and a sterile drape was applied covering the operative field. Maximum barrier sterile technique with sterile gowns and gloves were used for the procedure. A timeout was performed prior to the initiation of the procedure. Local anesthesia was provided with 1% lidocaine. The right femoral head was marked fluoroscopically. Under ultrasound guidance, the right common femoral artery was accessed with a micropuncture kit after the overlying soft tissues were anesthetized with 1% lidocaine. An ultrasound image was saved for documentation purposes. The micropuncture sheath was exchanged for a 5 FrenPakistancular sheath over a Bentson wire. A closure arteriogram was performed through the side of the sheath confirming access within the right common femoral artery. Over a Bentson wire, a Mickelson catheter was advanced to the level of the  thoracic aorta where it was back bled and flushed. The catheter was then utilized to select the superior mesenteric artery and a superior mesenteric arteriogram with delayed portal venous imaging was performed. The Mickelson catheter was then advanced cranially and utilized to select the celiac artery and a celiac arteriogram was performed. Utilizing a fathom 14 microwire, a regular renegade micro catheter was utilized to select common and proper hepatic arteries and selective arteriograms were performed. The Renegade microcatheter was utilized to select an accessory pancreaticoduodenal artery noted  to arise subjacent to the left hepatic artery. Selective arteriogram was performed confirming appropriate positioning and the vessel was subsequent percutaneously coil embolized with 3 overlapping 2 mm x 5 mm partial coils to near the vessel's origin. The microcatheter was retracted into the proper hepatic artery and a post embolization proper hepatic arteriogram was performed Next, the microcatheter was utilized to select the accessory right hepatic artery and accessory right hepatic arteriogram was performed. From this location, 2 mCi of technetium 99 MAA was administered. Next the microcatheter was advanced into the right hepatic artery and a selective right hepatic arteriogram was performed. From the location proximal to the vessels bifurcation, 3 mCi of technetium 99 MAA was administered. At this point, the procedure was terminated. All wires and catheters and sheaths were removed from the patient. Hemostasis was achieved at the right groin and access site with manual compression. A dressing was placed. The patient tolerated procedure well without immediate postprocedural complication. The patient was escorted to nuclear medicine department for planar imaging. FINDINGS: Selective superior mesenteric arteriogram was again negative for accessory or replaced hepatic arterial supply. Late portal venous phase imaging  demonstrates patency of the main, right and left portal veins. Celiac arteriogram re-demonstrates an accessory right hepatic artery supplying the medial aspect of the right lobe of the liver. 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 Successful split dose administration of a total of 5 mCi of technetium 54mMAA via the right hepatic and accessory right hepatic arteries. IMPRESSION: 1. Successful pre Y-90 arteriogram with percutaneous coil embolization of the pancreaticoduodenal artery. 2. Successful split dose administration of a total of 5 mCi of technetium 926mAA via the right hepatic and accessory right hepatic arteries. Awaiting results of nuclear medicine liver scan. PLAN: Pending the results of the nuclear medicine liver scan, the patient will return Y90 radioembolization the right hepatic artery. Pending the patient's recovery from this initial treatment session, the patient will return for radioembolization of the accessory right hepatic artery. Electronically Signed   By: JoSandi Mariscal.D.   On: 12/08/2017 13:45   Ir Fluoro Rm 30-60 Min  Result Date: 12/09/2017 CLINICAL DATA:  Patient underwent technically successful mapping Y 90 radioembolization earlier today however prior to discharge developed acute onset of right lower abdominal pain. Subsequent CTA demonstrated small area of persistent extravasation at the right common femoral access site. Please perform fluoroscopic assisted localization of the access site of the right common femoral artery prior to continued manual compression in hopes of achieving hemostasis. EXAM: IR FLOURO RM 0-60 MIN TECHNIQUE: Patient was placed supine on the fluoroscopy table and the approximate location of the right common femoral arterial access site was marked fluoroscopically. Next, approximately 30 minutes of  additional manual compression was performed at this location. Patient reports subjective improvement in his right lower abdominal pain and has remained hemodynamically stable throughout this prolonged observation in interventional radiology department. CONTRAST:  None FLUOROSCOPY TIME:  12 seconds COMPARISON:  Mapping Y 90 radioembolization - earlier same day; CT abdomen pelvis - earlier same day FINDINGS: Patient was placed supine on the fluoroscopy table and the approximate location of the right common femoral arterial access site was marked fluoroscopically. Next, approximately 30 minutes of additional manual compression was performed at this location. Patient reports subjective improvement in his right lower abdominal pain and has remained hemodynamically stable throughout this  prolonged observation in interventional radiology department. IMPRESSION: Successful fluoroscopic guided marking of the right femoral head followed by additional 30 minutes of manual compression at the right common femoral arterial access site. Electronically Signed   By: Sandi Mariscal M.D.   On: 12/09/2017 13:48   Ir US Guide Vasc Access Right  Result Date: 12/08/2017 INDICATION: History of multifocal hepatocellular carcinoma. Patient presents today for Y 90 mapping procedure. Please refer to formal consultation in the epic EMR dated 11/17/2017 for additional details. EXAM: 1. ULTRASOUND GUIDANCE FOR ARTERIAL ACCESS 2. CELIAC AND SUPERIOR MESENTERIC ARTERIOGRAM (1st ORDER) 3. SELECTIVE COMMON AND PROPER HEPATIC ARTERIOGRAMS 4. SELECTIVE ARTERIOGRAM OF ACCESSORY PANCREATICODUODENAL ARTERY AND PERCUTANEOUS COIL EMBOLIZATION 5. SELECTIVE ARTERIOGRAM OF ACCESSORY RIGHT HEPATIC ARTERY AND ADMINISTRATION OF Tc21mMAA 6. SELECTIVE ARTERIOGRAM OF THE RIGHT HEPATIC ARTERY AND ADMINISTRATION OF Tc918mAA COMPARISON:  Hepatic bland embolization - 07/25/2017; abdominal MRI - 11/01/2017; CTA of the abdomen pelvis - 07/07/2017 MEDICATIONS: None  RADIOPHARMACEUTICALS:  A total of 5 mCi Tc9946mA was administered, 2 mCi via an accessory right hepatic artery and 3 mCi via the right hepatic artery. CONTRAST:  75 cc Isovue 300 ANESTHESIA/SEDATION: Moderate (conscious) sedation was employed during this procedure. A total of Versed 3 mg and Fentanyl 150 mcg was administered intravenously. Moderate Sedation Time: 59 minutes. The patient's level of consciousness and vital signs were monitored continuously by radiology nursing throughout the procedure under my direct supervision. FLUOROSCOPY TIME:  14 minutes, 48 seconds (584 mGy) ACCESS: Right common femoral artery; hemostasis achieved with manual compression. COMPLICATIONS: None immediate. TECHNIQUE: Informed written consent was obtained from the patient after a discussion of the risks, benefits and alternatives to treatment. Questions regarding the procedure were encouraged and answered. A timeout was performed prior to the initiation of the procedure. The right groin was prepped and draped in the usual sterile fashion, and a sterile drape was applied covering the operative field. Maximum barrier sterile technique with sterile gowns and gloves were used for the procedure. A timeout was performed prior to the initiation of the procedure. Local anesthesia was provided with 1% lidocaine. The right femoral head was marked fluoroscopically. Under ultrasound guidance, the right common femoral artery was accessed with a micropuncture kit after the overlying soft tissues were anesthetized with 1% lidocaine. An ultrasound image was saved for documentation purposes. The micropuncture sheath was exchanged for a 5 FrePakistanscular sheath over a Bentson wire. A closure arteriogram was performed through the side of the sheath confirming access within the right common femoral artery. Over a Bentson wire, a Mickelson catheter was advanced to the level of the thoracic aorta where it was back bled and flushed. The catheter was then  utilized to select the superior mesenteric artery and a superior mesenteric arteriogram with delayed portal venous imaging was performed. The Mickelson catheter was then advanced cranially and utilized to select the celiac artery and a celiac arteriogram was performed. Utilizing a fathom 14 microwire, a regular renegade micro catheter was utilized to select common and proper hepatic arteries and selective arteriograms were performed. The Renegade microcatheter was utilized to select an accessory pancreaticoduodenal artery noted to arise subjacent to the left hepatic artery. Selective arteriogram was performed confirming appropriate positioning and the vessel was subsequent percutaneously coil embolized with 3 overlapping 2 mm x 5 mm partial coils to near the vessel's origin. The microcatheter was retracted into the proper hepatic artery and a post embolization proper hepatic arteriogram was performed Next, the microcatheter was utilized to  select the accessory right hepatic artery and accessory right hepatic arteriogram was performed. From this location, 2 mCi of technetium 99 MAA was administered. Next the microcatheter was advanced into the right hepatic artery and a selective right hepatic arteriogram was performed. From the location proximal to the vessels bifurcation, 3 mCi of technetium 99 MAA was administered. At this point, the procedure was terminated. All wires and catheters and sheaths were removed from the patient. Hemostasis was achieved at the right groin and access site with manual compression. A dressing was placed. The patient tolerated procedure well without immediate postprocedural complication. The patient was escorted to nuclear medicine department for planar imaging. FINDINGS: Selective superior mesenteric arteriogram was again negative for accessory or replaced hepatic arterial supply. Late portal venous phase imaging demonstrates patency of the main, right and left portal veins. Celiac  arteriogram re-demonstrates an accessory right hepatic artery supplying the medial aspect of the right lobe of the liver. 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 Successful split dose administration of a total of 5 mCi of technetium 49mMAA via the right hepatic and accessory right hepatic arteries. IMPRESSION: 1. Successful pre Y-90 arteriogram with percutaneous coil embolization of the pancreaticoduodenal artery. 2. Successful split dose administration of a total of 5 mCi of technetium 952mAA via the right hepatic and accessory right hepatic arteries. Awaiting results of nuclear medicine liver scan. PLAN: Pending the results of the nuclear medicine liver scan, the patient will return Y90 radioembolization the right hepatic artery. Pending the patient's recovery from this initial treatment session, the patient will return for radioembolization of the accessory right hepatic artery. Electronically Signed   By: JoSandi Mariscal.D.   On: 12/08/2017 13:45   Ir Embo Arterial Not HeRavensworthuide Roadmapping  Result Date: 12/08/2017 INDICATION: History of multifocal hepatocellular carcinoma. Patient presents today for Y 90 mapping procedure. Please refer to formal consultation in the epic EMR dated 11/17/2017 for additional details. EXAM: 1. ULTRASOUND GUIDANCE FOR ARTERIAL ACCESS 2. CELIAC AND SUPERIOR MESENTERIC ARTERIOGRAM (1st ORDER) 3. SELECTIVE COMMON AND PROPER HEPATIC ARTERIOGRAMS 4. SELECTIVE ARTERIOGRAM OF ACCESSORY PANCREATICODUODENAL ARTERY AND PERCUTANEOUS COIL EMBOLIZATION 5. SELECTIVE ARTERIOGRAM OF ACCESSORY RIGHT HEPATIC ARTERY AND ADMINISTRATION OF Tc9921mA 6. SELECTIVE ARTERIOGRAM OF THE RIGHT HEPATIC ARTERY AND ADMINISTRATION OF Tc99m72m COMPARISON:  Hepatic bland embolization - 07/25/2017; abdominal MRI - 11/01/2017; CTA of the abdomen  pelvis - 07/07/2017 MEDICATIONS: None RADIOPHARMACEUTICALS:  A total of 5 mCi Tc99m 42mwas administered, 2 mCi via an accessory right hepatic artery and 3 mCi via the right hepatic artery. CONTRAST:  75 cc Isovue 300 ANESTHESIA/SEDATION: Moderate (conscious) sedation was employed during this procedure. A total of Versed 3 mg and Fentanyl 150 mcg was administered intravenously. Moderate Sedation Time: 59 minutes. The patient's level of consciousness and vital signs were monitored continuously by radiology nursing throughout the procedure under my direct supervision. FLUOROSCOPY TIME:  14 minutes, 48 seconds (584 mGy) ACCESS: Right common femoral artery; hemostasis achieved with manual compression. COMPLICATIONS: None immediate. TECHNIQUE: Informed written consent was obtained from the patient after a discussion of the risks, benefits and alternatives to treatment. Questions regarding the procedure were encouraged and answered. A timeout was performed prior to the initiation of the procedure. The right groin was prepped and draped in the usual sterile fashion, and a sterile drape was applied covering  the operative field. Maximum barrier sterile technique with sterile gowns and gloves were used for the procedure. A timeout was performed prior to the initiation of the procedure. Local anesthesia was provided with 1% lidocaine. The right femoral head was marked fluoroscopically. Under ultrasound guidance, the right common femoral artery was accessed with a micropuncture kit after the overlying soft tissues were anesthetized with 1% lidocaine. An ultrasound image was saved for documentation purposes. The micropuncture sheath was exchanged for a 5 [place] vascular sheath over a Bentson wire. A closure arteriogram was performed through the side of the sheath confirming access within the right common femoral artery. Over a Bentson wire, a Mickelson catheter was advanced to the level of the thoracic aorta where it was back  bled and flushed. The catheter was then utilized to select the superior mesenteric artery and a superior mesenteric arteriogram with delayed portal venous imaging was performed. The Mickelson catheter was then advanced cranially and utilized to select the celiac artery and a celiac arteriogram was performed. Utilizing a fathom 14 microwire, a regular renegade micro catheter was utilized to select common and proper hepatic arteries and selective arteriograms were performed. The Renegade microcatheter was utilized to select an accessory pancreaticoduodenal artery noted to arise subjacent to the left hepatic artery. Selective arteriogram was performed confirming appropriate positioning and the vessel was subsequent percutaneously coil embolized with 3 overlapping 2 mm x 5 mm partial coils to near the vessel's origin. The microcatheter was retracted into the proper hepatic artery and a post embolization proper hepatic arteriogram was performed Next, the microcatheter was utilized to select the accessory right hepatic artery and accessory right hepatic arteriogram was performed. From this location, 2 mCi of technetium 99 MAA was administered. Next the microcatheter was advanced into the right hepatic artery and a selective right hepatic arteriogram was performed. From the location proximal to the vessels bifurcation, 3 mCi of technetium 99 MAA was administered. At this point, the procedure was terminated. All wires and catheters and sheaths were removed from the patient. Hemostasis was achieved at the right groin and access site with manual compression. A dressing was placed. The patient tolerated procedure well without immediate postprocedural complication. The patient was escorted to nuclear medicine department for planar imaging. FINDINGS: Selective superior mesenteric arteriogram was again negative for accessory or replaced hepatic arterial supply. Late portal venous phase imaging demonstrates patency of the main,  right and left portal veins. Celiac arteriogram re-demonstrates an accessory right hepatic artery supplying the medial aspect of the right lobe of the liver. 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 Successful split dose administration of a total of 5 mCi of technetium 80mMAA via the right hepatic and accessory right hepatic arteries. IMPRESSION: 1. Successful pre Y-90 arteriogram with percutaneous coil embolization of the pancreaticoduodenal artery. 2. Successful split dose administration of a total of 5 mCi of technetium 972mAA via the right hepatic and accessory right hepatic arteries. Awaiting results of nuclear medicine liver scan. PLAN: Pending the results of the nuclear medicine liver scan, the patient will return Y90 radioembolization the right hepatic artery. Pending the patient's recovery from this initial treatment session, the patient will return for radioembolization of the accessory right hepatic artery. Electronically Signed   By: JoSandi Mariscal.D.   On: 12/08/2017 13:45   Ct Angio Abd/pel W/ And/or W/o  Result Date:  12/08/2017 CLINICAL DATA:  Mapping Y 90 radioembolization earlier today, now with right lower abdominal pain. Evaluate for hematoma/access site complication. EXAM: CTA ABDOMEN AND PELVIS WITH CONTRAST TECHNIQUE: Multidetector CT imaging of the abdomen and pelvis was performed using the standard protocol during bolus administration of intravenous contrast. Multiplanar reconstructed images and MIPs were obtained and reviewed to evaluate the vascular anatomy. CONTRAST:  107m ISOVUE-370 IOPAMIDOL (ISOVUE-370) INJECTION 76% COMPARISON:  Abdominal MRI-11/01/2017; CT abdomen pelvis - 07/07/2017 FINDINGS: VASCULAR Aorta: Scattered mixed calcified and noncalcified atherosclerotic plaque with a normal caliber abdominal aorta, not resulting in  hemodynamically significant stenosis. Celiac: There is a minimal amount of atherosclerotic plaque involving the origin the celiac artery, not resulting in hemodynamically significant stenosis. Sequela of coil embolization the pancreaticoduodenal artery. SMA: There is a moderate amount of mixed calcified and noncalcified atherosclerotic plaque involving the origin of the SMA which approaches of approximately 50% luminal narrowing. Renals: Solitary bilaterally. There is a minimal amount of calcified atherosclerotic plaque involving the origin of left renal artery, not resulting in a hemodynamically significant stenosis. No vessel irregularity to suggest FMD. IMA: Remains widely patent. Pelvic inflow: There is a minimal amount of calcified atherosclerotic plaque involving the bilateral common iliac arteries, not resulting in a hemodynamically significant stenosis. The bilateral common iliac arteries are diseased though of normal caliber. The bilateral external iliac arteries are of normal caliber and widely patent. Right-sided proximal outflow: There is a minimal amount of mixed calcified and noncalcified atherosclerotic plaque within the right common femoral artery. There is a tiny area of active extravasation at the location of the right common femoral artery access site (representative images 23 through 25) with associated hematoma tracking into the right lower pelvis and abdomen. No definitive retroperitoneal hematoma. The adjacent inferior epigastric artery appears widely patent as does the right deep iliac circumflex artery. No vessel dissection. The imaged portions of the right superficial and deep femoral artery appear widely patent. Left-sided proximal outflow: The left common femoral artery is widely patent without hemodynamically significant stenosis. There is a minimal amount of calcified atherosclerotic plaque involving the origin of the left superficial femoral artery, not resulting in hemodynamically  significant stenosis. The left deep femoral artery is widely patent throughout its imaged course. Veins: The pelvic venous system and IVC appear widely patent. Review of the MIP images confirms the above findings. NON-VASCULAR Lower chest: Limited visualization of the lower thorax is negative for focal airspace opacity or pleural effusion. Coronary artery calcifications.  No pericardial effusion. 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 Re demonstrated ill-defined area of hyperenhancement adjacent to the ablation zone within the anterior segment of the right lobe of the liver (image 23, series 2 as well as nodular areas of enhancement within the right lobe of the liver (images 21, 24 and 34) compatible with known multifocal hepatocellular carcinoma. The portal vein appears widely patent. Normal appearance of the gallbladder.  No radiopaque gallstones. Pancreas: Normal appearance of the pancreas Spleen: Normal appearance of the spleen Adrenals/Urinary Tract: Excreted contrast from a prior arteriogram seen within bilateral renal collecting system. No definite renal stones. No discrete renal lesions. No urinary obstruction or perinephric stranding. Excreted contrast seen within the urinary bladder. Stomach/Bowel: There is minimal mass effect upon the cecum a distal sigmoid colon secondary to the right lower abdominal hematoma. The bowel is otherwise normal in course and caliber without wall thickening or evidence of enteric obstruction. No pneumoperitoneum, pneumatosis or portal venous gas. Lymphatic: No bulky porta  hepatis, retroperitoneal, mesenteric, pelvic or inguinal lymphadenopathy. Reproductive: Dystrophic calcifications within normal sized prostate gland. No free fluid in the pelvic cul-de-sac. Other: Minimal amount of expected stranding within the right groin regional to the access site. Musculoskeletal: No acute or aggressive osseous abnormalities. Stigmata of DISH  within the lower thoracic spine. Mild-to-moderate multilevel lumbar spine DDD, worse at a definitive 3 and L3-L4 with disc space height loss, endplate irregularity and sclerosis. Bilateral facet degenerative change within the lower lumbar spine. Bone island is noted involving the posterior aspect the right ilium, unchanged. IMPRESSION: VASCULAR 1. Tiny area of active extravasation from the right common femoral artery access site with associated hematoma extending from the right pelvis to the right lower abdomen. Patient was subsequently brought to the interventional radiology suite and approximately 30 additional minutes of manual compression was applied. 2.  Aortic Atherosclerosis (ICD10-I70.0). NON-VASCULAR 1. Otherwise, no acute findings within the abdomen or pelvis. 2. Similar findings of hepatic cirrhosis and multifocal hepatocellular carcinoma as seen on recent abdominal MRI. Electronically Signed   By: Sandi Mariscal M.D.   On: 12/08/2017 16:53   Nm Fusion  Result Date: 12/08/2017 CLINICAL DATA:  Pre yttrium 90 radioembolization evaluation. EXAM: NUCLEAR MEDICINE LIVER SCAN; ULTRASOUND MISCELLANEOUS SOFT TISSUE TECHNIQUE: Abdominal images were obtained in multiple projections after intrahepatic arterial injection of radiopharmaceutical. SPECT imaging was performed. Lung shunt calculation was performed. RADIOPHARMACEUTICALS:  4.63mllicurie MAA TECHNETIUM TO 48M ALBUMIN AGGREGATED COMPARISON:  MRI 06/28/2017, angiography 12/08/2017 FINDINGS: The injected microaggregated albumin localizes within the RIGHT hepatic lobe. Small focus of activity in the porta hepatis. No evidence of activity within the stomach, duodenum, or bowel. Calculated shunt fraction to the lungs equals 8.0%. IMPRESSION: 1. Injected MAA tracer activity localizes to the RIGHT hepatic lobe. 2. Small focus of radiotracer activity within the porta hepatis. Recommend catheter angiography evaluation at time of treatment. 3. Lung shunt fraction  equals 8.0%. Findings conveyed toJOHN WATTS on 12/08/2017  at16:08. Electronically Signed   By: SSuzy BouchardM.D.   On: 12/08/2017 16:09   Scheduled Meds: . cholecalciferol  2,000 Units Oral Daily  . mirtazapine  15 mg Oral QHS  . polyethylene glycol  17 g Oral BID  . propranolol  20 mg Oral BID  . rifaximin  550 mg Oral BID  . senna-docusate  1 tablet Oral BID  . tamsulosin  0.4 mg Oral QPC supper  . vitamin B-12  100 mcg Oral Daily  . vitamin C  500 mg Oral Daily   Continuous Infusions: . sodium chloride 50 mL/hr at 12/09/17 1207    LOS: 0 days   OKerney Elbe DO [facility] Pager [phone] If 7PM-7AM, please contact night-coverage [email or web address] Password [email or web address] 12/09/2017, 1:56 PM

## 2017-12-10 LAB — COMPREHENSIVE METABOLIC PANEL
ALT: 25 U/L (ref 17–63)
ANION GAP: 8 (ref 5–15)
AST: 40 U/L (ref 15–41)
Albumin: 3.6 g/dL (ref 3.5–5.0)
Alkaline Phosphatase: 83 U/L (ref 38–126)
BUN: 9 mg/dL (ref 6–20)
CHLORIDE: 109 mmol/L (ref 101–111)
CO2: 23 mmol/L (ref 22–32)
Calcium: 8.5 mg/dL — ABNORMAL LOW (ref 8.9–10.3)
Creatinine, Ser: 0.94 mg/dL (ref 0.61–1.24)
GFR calc non Af Amer: >60 mL/min (ref >60)
Glucose, Bld: 110 mg/dL — ABNORMAL HIGH (ref 65–99)
Potassium: 4.1 mmol/L (ref 3.5–5.1)
SODIUM: 140 mmol/L (ref 135–145)
Total Bilirubin: 1.5 mg/dL — ABNORMAL HIGH (ref 0.3–1.2)
Total Protein: 6.1 g/dL — ABNORMAL LOW (ref 6.5–8.1)

## 2017-12-10 LAB — CBC WITH DIFFERENTIAL/PLATELET
BASOS ABS: 0 10*3/uL (ref 0.0–0.1)
Basophils Absolute: 0 10*3/uL (ref 0.0–0.1)
Basophils Relative: 0 %
Basophils Relative: 0 %
Eosinophils Absolute: 0.4 10*3/uL (ref 0.0–0.7)
Eosinophils Absolute: 0.4 10*3/uL (ref 0.0–0.7)
Eosinophils Relative: 6 %
Eosinophils Relative: 6 %
HEMATOCRIT: 26.3 % — AB (ref 39.0–52.0)
HEMATOCRIT: 26.1 % — AB (ref 39.0–52.0)
HEMOGLOBIN: 9 g/dL — AB (ref 13.0–17.0)
Hemoglobin: 9 g/dL — ABNORMAL LOW (ref 13.0–17.0)
LYMPHS ABS: 1 10*3/uL (ref 0.7–4.0)
LYMPHS ABS: 0.9 10*3/uL (ref 0.7–4.0)
LYMPHS PCT: 16 %
Lymphocytes Relative: 13 %
MCH: 32.4 pg (ref 26.0–34.0)
MCH: 32.7 pg (ref 26.0–34.0)
MCHC: 34.2 g/dL (ref 30.0–36.0)
MCHC: 34.5 g/dL (ref 30.0–36.0)
MCV: 94.6 fL (ref 78.0–100.0)
MCV: 94.9 fL (ref 78.0–100.0)
MONO ABS: 2.6 10*3/uL — AB (ref 0.1–1.0)
MONOS PCT: 39 %
Monocytes Absolute: 2.2 10*3/uL — ABNORMAL HIGH (ref 0.1–1.0)
Monocytes Relative: 35 %
NEUTROS ABS: 2.8 10*3/uL (ref 1.7–7.7)
NEUTROS ABS: 2.8 10*3/uL (ref 1.7–7.7)
NEUTROS PCT: 43 %
Neutrophils Relative %: 42 %
PLATELETS: 47 10*3/uL — AB (ref 150–400)
Platelets: 53 10*3/uL — ABNORMAL LOW (ref 150–400)
RBC: 2.78 MIL/uL — ABNORMAL LOW (ref 4.22–5.81)
RBC: 2.75 MIL/uL — ABNORMAL LOW (ref 4.22–5.81)
RDW: 13.6 % (ref 11.5–15.5)
RDW: 13.8 % (ref 11.5–15.5)
WBC: 6.4 10*3/uL (ref 4.0–10.5)
WBC: 6.7 10*3/uL (ref 4.0–10.5)

## 2017-12-10 LAB — MAGNESIUM: MAGNESIUM: 1.4 mg/dL — AB (ref 1.7–2.4)

## 2017-12-10 LAB — TROPONIN I: Troponin I: <0.03 ng/mL (ref <0.03)

## 2017-12-10 LAB — GLUCOSE, CAPILLARY: GLUCOSE-CAPILLARY: 155 mg/dL — AB (ref 65–99)

## 2017-12-10 LAB — PHOSPHORUS: Phosphorus: 2.3 mg/dL — ABNORMAL LOW (ref 2.5–4.6)

## 2017-12-10 MED ORDER — MAGNESIUM SULFATE 2 GM/50ML IV SOLN
2.0000 g | Freq: Once | INTRAVENOUS | Status: AC
  Administered 2017-12-10: 2 g via INTRAVENOUS
  Filled 2017-12-10 (×2): qty 50

## 2017-12-10 MED ORDER — POLYETHYLENE GLYCOL 3350 17 G PO PACK: 17.0000 g | PACK | Freq: Two times a day (BID) | ORAL | 0 refills | Status: DC

## 2017-12-10 MED ORDER — K PHOS MONO-SOD PHOS DI & MONO 155-852-130 MG PO TABS
500.0000 mg | ORAL_TABLET | Freq: Two times a day (BID) | ORAL | Status: DC
  Administered 2017-12-10: 500 mg via ORAL
  Filled 2017-12-10 (×2): qty 2

## 2017-12-10 MED ORDER — SENNOSIDES-DOCUSATE SODIUM 8.6-50 MG PO TABS: 1.0000 | ORAL_TABLET | Freq: Every day | ORAL | 0 refills | Status: DC

## 2017-12-10 NOTE — Discharge Summary (Signed)
Physician Discharge Summary  Mike Ferguson HAL:[id number] DOB: 02/09/1941 DOA: 12/08/2017  PCP: Mike Cruel, MD  Admit date: 12/08/2017 Discharge date: 12/10/2017  Admitted From: Home (IR Suite) Disposition: Home  Recommendations for Outpatient Follow-up:  1. Follow up with PCP in 1 week and repeat blood work within 1 week 2. Follow up with Interventional Radiology for continued management of [place] 3. Please obtain CMP/CBC, Mag, Phos in one week 4. Please follow up on the following pending results:  Home Health: No Equipment/Devices: None recommended by PT   Discharge Condition: Stable  CODE STATUS: FULL CODE Diet recommendation: Heart Healthy Diet  Brief/Interim Summary: Mike Ferguson Combsis a 77 y.o.malewith medical history significantforalcoholic liver cirrhosis with esophageal varices and complicated by hepatocellular carcinoma,as well as history of hypertension, history of arthritis and history of bilateral hearing deficits and hearing impairment who presented to the [facility] for a Y-90 with coil embolization of the pancreaticoduodenal artery procedure electively for his hepatocellular carcinoma. Patient underwent the procedure today and post procedurally was doing well till patient tried to micturate and developed a sharp sudden stabbing pain in his right lower abdomen which was persistent. Patient denied any nausea, vomiting, shortness breath, chest pain but complained of sharp 10 out of 10 pain in severity and described as "the worst pain of my life". Interventional radiology obtained a stat CTA of the abdomen pelvis which showed a tiny area of extravasation from the right common femoral artery access site with associated hematoma extending from the right pelvis in the right lower abdomen. Pressure was held for about 30 minutes and bleeding stopped. Because of this episode and hematoma TRH was called to admit this patient for observation for his  hematoma.  Yesterday overnight the patient had a syncopal episode and a rapid response was called.  It was thought the patient had a CODE BLUE by the floor nurse that she felt that she may go down and became unresponsive and diaphoretic with increased work of breathing.  Patient became responsive moments afterward began to speak so CODE BLUE was canceled.  Patient be worked up for his syncopal episode and was unremarkable. Patient had a head CT done showed no acute intracranial abnormalities, echocardiogram done which showed EF of 60 to 65%, as well as a TSH which was normal and orthostatic vital signs were negative.  Patient improved.  Hemoglobin hematocrit count trended down unit further however was stable and after repeat at 9.0.  Patient was deemed medically stable to be discharged home as he is no longer symptomatic and was worked up and will need to follow-up with his PCP as well as interventional radiology in the outpatient setting.  Discharge Diagnoses:  Principal Problem:   Hematoma Active Problems:   Hepatocellular carcinoma (HCC)   Alcoholic cirrhosis of liver without ascites (HCC)  AcuteRightLowerQuadrant abdominal pain post procedure with resultant hematoma and extravasation of some blood and acute blood loss anemia -Placed the patient in telemetry obs but changed to Inpatient given Syncopal episode over night -Pain control with IV Dilaudid 0.5 mg every 4 PRN for moderate pain along with tramadol 50 mg p.o. every 6 as needed for moderate and severe pain -Patient's hemoglobin/hematocrit was 14.4/41.1 and dropped to 9.0/26.3 and likely stabilizing now; Repeat CBC this Afternoon showed Hb/Hct of 9.0/26.1 -Patient was placed on bedrest for at least 4 hours post procedurally and keeping his right leg flat -Discussed case with IR Physician Dr. Earleen Newport yesterday who evaluated the hematoma site and states that hematoma  had improved and felt that site was stable.  From his standpoint he states  that 77 patient can be discharged home and follow-up with iron outpatient setting. -Repeat CBC as an outpatient -Hematoma site is soft and will need to follow-up with PCP as well as interventional radiology  Acute Syncopal Episode -Unclear exactly what happened overnight on 5/23-5/24 but per report this morning from the nurse patient was not on the commode when he syncopized.  A CODE BLUE was not actually called because patient did not actually lose a pulse -Cardiac troponins were less than 0.033 -ECHOCARDIOGRAM showed LVEF 60-65%, mild LVH, normal wall motion, grade 1 DD, indeterminate LV filling pressure, mild MR, normal LA size,trivial TR, RVSP 29 mmHg, normal IVC. -Head CT w/o Contrast showed No acute intracranial abnormality or focal lesion to explain the patient's syncopal episode or dizziness. Mild white matter changes are within normal limits for age. Atherosclerosis. -Per report he was slightly orthostatic yesterday morning so was gently rehydrate and give a bolus of 250 mL's and continue maintenance fluids at 50 mL's per hour after; IV fluids are now stopped -Patient's orthostatic vital signs were checked again he was no longer orthostatic -Checked TSH and was 0.548 -Continued with Telemetry acute events noted on telemetry overnight -PT to Evaluate and Treat and recommending no follow up  -Patient deemed medically stable need to follow-up with PCP in outpatient setting  History of alcoholic liver cirrhosis complicated with esophageal varices,portal venous hypertension, andMultifocalhepatocellular carcinoma -Continue with home medications including propranolol 20 mg p.o. twice daily for varices -Continue Rifaximin 550 mg p.o. twice daily for liver cirrhosis -Pain control as above  Hepatocellular Carcinoma -As above patient underwent Y 90 roadmapping with embolization of the pancreaticoduodenal artery -IR felt he was doing well and stated from their standpoint that he could be  discharged and follow-up with iron as an outpatient -Follow-up with IR as an outpatient  Thrombocytopenia -Chronic in the setting of liver cirrhosis. Platelet count was 83 and trended down to 47 -Continue to monitor for signs and symptoms of bleeding and repeat CBC as an outpatient  Hyperbilirubinemia -Patient's T bili was 1.6 and slightly worsened to 1.8 now 1.5.  Likely in the setting of alcoholic liver cirrhosis as well as hepatocellular carcinoma -Repeat CMP in the a.m.  Hypomagnesemia -Patient's magnesium level this morning was 1.4 -Replete with IV mag sulfate 3 g-continue to monitor and replete as necessary -Repeat magnesium level as an outpatient  Hypophosphatemia -Patient's phosphorus level this morning was 2.3 -Replete with K Phos Neutral 500 mg p.o. twice daily -Continue to monitor and replete as necessary -Repeat phosphorus level in outpatient setting  Discharge Instructions  Discharge Instructions    Call MD for:  difficulty breathing, headache or visual disturbances   Complete by:  As directed    Call MD for:  extreme fatigue   Complete by:  As directed    Call MD for:  hives   Complete by:  As directed    Call MD for:  persistant dizziness or light-headedness   Complete by:  As directed    Call MD for:  persistant nausea and vomiting   Complete by:  As directed    Call MD for:  redness, tenderness, or signs of infection (pain, swelling, redness, odor or green/yellow discharge around incision site)   Complete by:  As directed    Call MD for:  severe uncontrolled pain   Complete by:  As directed    Call MD for:  temperature >  100.4   Complete by:  As directed    Diet - low sodium heart healthy   Complete by:  As directed    Discharge instructions   Complete by:  As directed    Follow-up with PCP as well as Interventional Radiology as an outpatient.  Medications as prescribed.  If symptoms change or worsen please return to the emergency room for evaluation.    Increase activity slowly   Complete by:  As directed      Allergies as of 12/10/2017      Reactions   Erythromycin Other (See Comments)   Abd cramps      Medication List    TAKE these medications   acetaminophen 500 MG tablet Commonly known as:  TYLENOL Take 500 mg by mouth daily as needed for mild pain.   cholecalciferol 1000 units tablet Commonly known as:  VITAMIN D Take 2,000 Units by mouth daily.   loratadine-pseudoephedrine 10-240 MG 24 hr tablet Commonly known as:  CLARITIN-D 24-hour Take 1 tablet by mouth daily as needed for allergies.   mirtazapine 15 MG tablet Commonly known as:  REMERON Take 15 mg by mouth at bedtime.   polyethylene glycol packet Commonly known as:  MIRALAX / GLYCOLAX Take 17 g by mouth 2 (two) times daily.   propranolol 20 MG tablet Commonly known as:  INDERAL Take 20 mg by mouth 2 (two) times daily.   rifaximin 550 MG Tabs tablet Commonly known as:  XIFAXAN Take 550 mg by mouth 2 (two) times daily.   senna-docusate 8.6-50 MG tablet Commonly known as:  Senokot-S Take 1 tablet by mouth at bedtime.   sodium chloride 0.65 % Soln nasal spray Commonly known as:  OCEAN Place 1 spray into both nostrils as needed for congestion.   tamsulosin 0.4 MG Caps capsule Commonly known as:  FLOMAX TAKE 1 capsule Once in the evening Orally 30 day(s)   traMADol 50 MG tablet Commonly known as:  ULTRAM Take 50 mg by mouth every 6 (six) hours as needed for moderate pain or severe pain.   vitamin B-12 100 MCG tablet Commonly known as:  CYANOCOBALAMIN Take 100 mcg by mouth daily.   vitamin C 500 MG tablet Commonly known as:  ASCORBIC ACID Take 500 mg by mouth daily.      Follow-up Information    Mike Cruel, MD. Call.   Specialty:  Family Medicine Why:  Follow up within 1 week and have Dr. Harrington Challenger repeat Blood work  Contact information: [place] [address] [phone]          Allergies  Allergen  Reactions  . Erythromycin Other (See Comments)    Abd cramps   Consultations:  Interventional Radiology  Procedures/Studies: Ct Head Wo Contrast  Result Date: 12/09/2017 CLINICAL DATA:  Syncopal episode yesterday. Dizzy. Hepatocellular carcinoma. EXAM: CT HEAD WITHOUT CONTRAST TECHNIQUE: Contiguous axial images were obtained from the base of the skull through the vertex without intravenous contrast. COMPARISON:  None. FINDINGS: Brain: Mild white matter changes are present bilaterally. No acute or focal cortical infarct is present. Basal ganglia are intact. Insular ribbon is normal bilaterally. The brainstem and cerebellum are normal. Ventricles are of normal size. No significant extra-axial fluid collection is present. Vascular: Atherosclerotic calcifications are present within the cavernous internal carotid arteries bilaterally. There is no hyperdense vessel. Skull: Calvarium is intact. No focal lytic or blastic lesions are present. Sinuses/Orbits: The paranasal sinuses and mastoid air cells are clear. Bilateral lens replacements  are present. Globes and orbits are within normal limits. IMPRESSION: 1. No acute intracranial abnormality or focal lesion to explain the patient's syncopal episode or dizziness. 2. Mild white matter changes are within normal limits for age. 3. Atherosclerosis. Electronically Signed   By: San Morelle M.D.   On: 12/09/2017 13:26   Nm Liver Img Spect  Result Date: 12/08/2017 CLINICAL DATA:  Pre yttrium 90 radioembolization evaluation. EXAM: NUCLEAR MEDICINE LIVER SCAN; ULTRASOUND MISCELLANEOUS SOFT TISSUE TECHNIQUE: Abdominal images were obtained in multiple projections after intrahepatic arterial injection of radiopharmaceutical. SPECT imaging was performed. Lung shunt calculation was performed. RADIOPHARMACEUTICALS:  4.44mllicurie MAA TECHNETIUM TO 67M ALBUMIN AGGREGATED COMPARISON:  MRI 06/28/2017, angiography 12/08/2017 FINDINGS: The injected microaggregated  albumin localizes within the RIGHT hepatic lobe. Small focus of activity in the porta hepatis. No evidence of activity within the stomach, duodenum, or bowel. Calculated shunt fraction to the lungs equals 8.0%. IMPRESSION: 1. Injected MAA tracer activity localizes to the RIGHT hepatic lobe. 2. Small focus of radiotracer activity within the porta hepatis. Recommend catheter angiography evaluation at time of treatment. 3. Lung shunt fraction equals 8.0%. Findings conveyed toJOHN Ferguson on 12/08/2017  at16:08. Electronically Signed   By: SSuzy BouchardM.D.   On: 12/08/2017 16:09   Ir Angiogram Visceral Selective  Result Date: 12/08/2017 INDICATION: History of multifocal hepatocellular carcinoma. Patient presents today for Y 90 mapping procedure. Please refer to formal consultation in the epic EMR dated 11/17/2017 for additional details. EXAM: 1. ULTRASOUND GUIDANCE FOR ARTERIAL ACCESS 2. CELIAC AND SUPERIOR MESENTERIC ARTERIOGRAM (1st ORDER) 3. SELECTIVE COMMON AND PROPER HEPATIC ARTERIOGRAMS 4. SELECTIVE ARTERIOGRAM OF ACCESSORY PANCREATICODUODENAL ARTERY AND PERCUTANEOUS COIL EMBOLIZATION 5. SELECTIVE ARTERIOGRAM OF ACCESSORY RIGHT HEPATIC ARTERY AND ADMINISTRATION OF Tc922mAA 6. SELECTIVE ARTERIOGRAM OF THE RIGHT HEPATIC ARTERY AND ADMINISTRATION OF Tc9965mA COMPARISON:  Hepatic bland embolization - 07/25/2017; abdominal MRI - 11/01/2017; CTA of the abdomen pelvis - 07/07/2017 MEDICATIONS: None RADIOPHARMACEUTICALS:  A total of 5 mCi Tc99m24m was administered, 2 mCi via an accessory right hepatic artery and 3 mCi via the right hepatic artery. CONTRAST:  75 cc Isovue 300 ANESTHESIA/SEDATION: Moderate (conscious) sedation was employed during this procedure. A total of Versed 3 mg and Fentanyl 150 mcg was administered intravenously. Moderate Sedation Time: 59 minutes. The patient's level of consciousness and vital signs were monitored continuously by radiology nursing throughout the procedure under my direct  supervision. FLUOROSCOPY TIME:  14 minutes, 48 seconds (584 mGy) ACCESS: Right common femoral artery; hemostasis achieved with manual compression. COMPLICATIONS: None immediate. TECHNIQUE: Informed written consent was obtained from the patient after a discussion of the risks, benefits and alternatives to treatment. Questions regarding the procedure were encouraged and answered. A timeout was performed prior to the initiation of the procedure. The right groin was prepped and draped in the usual sterile fashion, and a sterile drape was applied covering the operative field. Maximum barrier sterile technique with sterile gowns and gloves were used for the procedure. A timeout was performed prior to the initiation of the procedure. Local anesthesia was provided with 1% lidocaine. The right femoral head was marked fluoroscopically. Under ultrasound guidance, the right common femoral artery was accessed with a micropuncture kit after the overlying soft tissues were anesthetized with 1% lidocaine. An ultrasound image was saved for documentation purposes. The micropuncture sheath was exchanged for a 5 FrenPakistancular sheath over a Bentson wire. A closure arteriogram was performed through the side of the sheath confirming access within the right common femoral  artery. Over a Bentson wire, a Mickelson catheter was advanced to the level of the thoracic aorta where it was back bled and flushed. The catheter was then utilized to select the superior mesenteric artery and a superior mesenteric arteriogram with delayed portal venous imaging was performed. The Mickelson catheter was then advanced cranially and utilized to select the celiac artery and a celiac arteriogram was performed. Utilizing a fathom 14 microwire, a regular renegade micro catheter was utilized to select common and proper hepatic arteries and selective arteriograms were performed. The Renegade microcatheter was utilized to select an accessory pancreaticoduodenal  artery noted to arise subjacent to the left hepatic artery. Selective arteriogram was performed confirming appropriate positioning and the vessel was subsequent percutaneously coil embolized with 3 overlapping 2 mm x 5 mm partial coils to near the vessel's origin. The microcatheter was retracted into the proper hepatic artery and a post embolization proper hepatic arteriogram was performed Next, the microcatheter was utilized to select the accessory right hepatic artery and accessory right hepatic arteriogram was performed. From this location, 2 mCi of technetium 99 MAA was administered. Next the microcatheter was advanced into the right hepatic artery and a selective right hepatic arteriogram was performed. From the location proximal to the vessels bifurcation, 3 mCi of technetium 99 MAA was administered. At this point, the procedure was terminated. All wires and catheters and sheaths were removed from the patient. Hemostasis was achieved at the right groin and access site with manual compression. A dressing was placed. The patient tolerated procedure well without immediate postprocedural complication. The patient was escorted to nuclear medicine department for planar imaging. FINDINGS: Selective superior mesenteric arteriogram was again negative for accessory or replaced hepatic arterial supply. Late portal venous phase imaging demonstrates patency of the main, right and left portal veins. Celiac arteriogram re-demonstrates an accessory right hepatic artery supplying the medial aspect of the right lobe of the liver. 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 Successful split dose administration of a total of 5 mCi of technetium 50mMAA via the right hepatic and accessory right hepatic arteries. IMPRESSION: 1. Successful pre Y-90 arteriogram with percutaneous coil  embolization of the pancreaticoduodenal artery. 2. Successful split dose administration of a total of 5 mCi of technetium 972mAA via the right hepatic and accessory right hepatic arteries. Awaiting results of nuclear medicine liver scan. PLAN: Pending the results of the nuclear medicine liver scan, the patient will return Y90 radioembolization the right hepatic artery. Pending the patient's recovery from this initial treatment session, the patient will return for radioembolization of the accessory right hepatic artery. Electronically Signed   By: JoSandi Mariscal.D.   On: 12/08/2017 13:45   Ir Angiogram Visceral Selective  Result Date: 12/08/2017 INDICATION: History of multifocal hepatocellular carcinoma. Patient presents today for Y 90 mapping procedure. Please refer to formal consultation in the epic EMR dated 11/17/2017 for additional details. EXAM: 1. ULTRASOUND GUIDANCE FOR ARTERIAL ACCESS 2. CELIAC AND SUPERIOR MESENTERIC ARTERIOGRAM (1st ORDER) 3. SELECTIVE COMMON AND PROPER HEPATIC ARTERIOGRAMS 4. SELECTIVE ARTERIOGRAM OF ACCESSORY PANCREATICODUODENAL ARTERY AND PERCUTANEOUS COIL EMBOLIZATION 5. SELECTIVE ARTERIOGRAM OF ACCESSORY RIGHT HEPATIC ARTERY AND ADMINISTRATION OF Tc9959mA 6. SELECTIVE ARTERIOGRAM OF THE RIGHT HEPATIC ARTERY AND ADMINISTRATION OF Tc99m33m COMPARISON:  Hepatic bland embolization - 07/25/2017; abdominal MRI - 11/01/2017; CTA of the abdomen pelvis - 07/07/2017 MEDICATIONS: None RADIOPHARMACEUTICALS:  A total of 5 mCi Tc50mMAA was administered, 2 mCi via an accessory right hepatic artery and 3 mCi via the right hepatic artery. CONTRAST:  75 cc Isovue 300 ANESTHESIA/SEDATION: Moderate (conscious) sedation was employed during this procedure. A total of Versed 3 mg and Fentanyl 150 mcg was administered intravenously. Moderate Sedation Time: 59 minutes. The patient's level of consciousness and vital signs were monitored continuously by radiology nursing throughout the procedure under my  direct supervision. FLUOROSCOPY TIME:  14 minutes, 48 seconds (584 mGy) ACCESS: Right common femoral artery; hemostasis achieved with manual compression. COMPLICATIONS: None immediate. TECHNIQUE: Informed written consent was obtained from the patient after a discussion of the risks, benefits and alternatives to treatment. Questions regarding the procedure were encouraged and answered. A timeout was performed prior to the initiation of the procedure. The right groin was prepped and draped in the usual sterile fashion, and a sterile drape was applied covering the operative field. Maximum barrier sterile technique with sterile gowns and gloves were used for the procedure. A timeout was performed prior to the initiation of the procedure. Local anesthesia was provided with 1% lidocaine. The right femoral head was marked fluoroscopically. Under ultrasound guidance, the right common femoral artery was accessed with a micropuncture kit after the overlying soft tissues were anesthetized with 1% lidocaine. An ultrasound image was saved for documentation purposes. The micropuncture sheath was exchanged for a 5 [place] sheath over a Bentson wire. A closure arteriogram was performed through the side of the sheath confirming access within the right common femoral artery. Over a Bentson wire, a Mickelson catheter was advanced to the level of the thoracic aorta where it was back bled and flushed. The catheter was then utilized to select the superior mesenteric artery and a superior mesenteric arteriogram with delayed portal venous imaging was performed. The Mickelson catheter was then advanced cranially and utilized to select the celiac artery and a celiac arteriogram was performed. Utilizing a fathom 14 microwire, a regular renegade micro catheter was utilized to select common and proper hepatic arteries and selective arteriograms were performed. The Renegade microcatheter was utilized to select an accessory  pancreaticoduodenal artery noted to arise subjacent to the left hepatic artery. Selective arteriogram was performed confirming appropriate positioning and the vessel was subsequent percutaneously coil embolized with 3 overlapping 2 mm x 5 mm partial coils to near the vessel's origin. The microcatheter was retracted into the proper hepatic artery and a post embolization proper hepatic arteriogram was performed Next, the microcatheter was utilized to select the accessory right hepatic artery and accessory right hepatic arteriogram was performed. From this location, 2 mCi of technetium 99 MAA was administered. Next the microcatheter was advanced into the right hepatic artery and a selective right hepatic arteriogram was performed. From the location proximal to the vessels bifurcation, 3 mCi of technetium 99 MAA was administered. At this point, the procedure was terminated. All wires and catheters and sheaths were removed from the patient. Hemostasis was achieved at the right groin and access site with manual compression. A dressing was placed. The patient tolerated procedure well without immediate postprocedural complication. The patient was escorted to nuclear medicine department for planar imaging. FINDINGS: Selective superior mesenteric arteriogram was again negative for accessory or replaced hepatic arterial supply. Late portal venous phase imaging demonstrates patency of the main, right and left portal veins. Celiac arteriogram re-demonstrates an accessory right hepatic artery supplying the medial aspect of the right lobe of the liver. An accessory pancreaticoduodenal artery was  again noted to arise subjacent to the takeoff of the left hepatic artery and was subsequently selected and coil embolized to near the vessel's origin. Successful split dose administration of a total of 5 mCi of technetium 57mMAA via the right hepatic and accessory right hepatic arteries. IMPRESSION: 1. Successful pre Y-90 arteriogram with  percutaneous coil embolization of the pancreaticoduodenal artery. 2. Successful split dose administration of a total of 5 mCi of technetium 921mAA via the right hepatic and accessory right hepatic arteries. Awaiting results of nuclear medicine liver scan. PLAN: Pending the results of the nuclear medicine liver scan, the patient will return Y90 radioembolization the right hepatic artery. Pending the patient's recovery from this initial treatment session, the patient will return for radioembolization of the accessory right hepatic artery. Electronically Signed   By: JoSandi Mariscal.D.   On: 12/08/2017 13:45   Ir Angiogram Selective Each Additional Vessel  Result Date: 12/08/2017 INDICATION: History of multifocal hepatocellular carcinoma. Patient presents today for Y 90 mapping procedure. Please refer to formal consultation in the epic EMR dated 11/17/2017 for additional details. EXAM: 1. ULTRASOUND GUIDANCE FOR ARTERIAL ACCESS 2. CELIAC AND SUPERIOR MESENTERIC ARTERIOGRAM (1st ORDER) 3. SELECTIVE COMMON AND PROPER HEPATIC ARTERIOGRAMS 4. SELECTIVE ARTERIOGRAM OF ACCESSORY PANCREATICODUODENAL ARTERY AND PERCUTANEOUS COIL EMBOLIZATION 5. SELECTIVE ARTERIOGRAM OF ACCESSORY RIGHT HEPATIC ARTERY AND ADMINISTRATION OF [id number] 6. SELECTIVE ARTERIOGRAM OF THE RIGHT HEPATIC ARTERY AND ADMINISTRATION OF Tc99m22m COMPARISON:  Hepatic bland embolization - 07/25/2017; abdominal MRI - 11/01/2017; CTA of the abdomen pelvis - 07/07/2017 MEDICATIONS: None RADIOPHARMACEUTICALS:  A total of 5 mCi Tc99m 63mwas administered, 2 mCi via an accessory right hepatic artery and 3 mCi via the right hepatic artery. CONTRAST:  75 cc Isovue 300 ANESTHESIA/SEDATION: Moderate (conscious) sedation was employed during this procedure. A total of Versed 3 mg and Fentanyl 150 mcg was administered intravenously. Moderate Sedation Time: 59 minutes. The patient's level of consciousness and vital signs were monitored continuously by radiology nursing  throughout the procedure under my direct supervision. FLUOROSCOPY TIME:  14 minutes, 48 seconds (584 mGy) ACCESS: Right common femoral artery; hemostasis achieved with manual compression. COMPLICATIONS: None immediate. TECHNIQUE: Informed written consent was obtained from the patient after a discussion of the risks, benefits and alternatives to treatment. Questions regarding the procedure were encouraged and answered. A timeout was performed prior to the initiation of the procedure. The right groin was prepped and draped in the usual sterile fashion, and a sterile drape was applied covering the operative field. Maximum barrier sterile technique with sterile gowns and gloves were used for the procedure. A timeout was performed prior to the initiation of the procedure. Local anesthesia was provided with 1% lidocaine. The right femoral head was marked fluoroscopically. Under ultrasound guidance, the right common femoral artery was accessed with a micropuncture kit after the overlying soft tissues were anesthetized with 1% lidocaine. An ultrasound image was saved for documentation purposes. The micropuncture sheath was exchanged for a 5 [place] sheath over a Bentson wire. A closure arteriogram was performed through the side of the sheath confirming access within the right common femoral artery. Over a Bentson wire, a Mickelson catheter was advanced to the level of the thoracic aorta where it was back bled and flushed. The catheter was then utilized to select the superior mesenteric artery and a superior mesenteric arteriogram with delayed portal venous imaging was performed. The Mickelson catheter was then advanced cranially and utilized to select the celiac artery and a  celiac arteriogram was performed. Utilizing a fathom 14 microwire, a regular renegade micro catheter was utilized to select common and proper hepatic arteries and selective arteriograms were performed. The Renegade microcatheter was utilized to  select an accessory pancreaticoduodenal artery noted to arise subjacent to the left hepatic artery. Selective arteriogram was performed confirming appropriate positioning and the vessel was subsequent percutaneously coil embolized with 3 overlapping 2 mm x 5 mm partial coils to near the vessel's origin. The microcatheter was retracted into the proper hepatic artery and a post embolization proper hepatic arteriogram was performed Next, the microcatheter was utilized to select the accessory right hepatic artery and accessory right hepatic arteriogram was performed. From this location, 2 mCi of technetium 99 MAA was administered. Next the microcatheter was advanced into the right hepatic artery and a selective right hepatic arteriogram was performed. From the location proximal to the vessels bifurcation, 3 mCi of technetium 99 MAA was administered. At this point, the procedure was terminated. All wires and catheters and sheaths were removed from the patient. Hemostasis was achieved at the right groin and access site with manual compression. A dressing was placed. The patient tolerated procedure well without immediate postprocedural complication. The patient was escorted to nuclear medicine department for planar imaging. FINDINGS: Selective superior mesenteric arteriogram was again negative for accessory or replaced hepatic arterial supply. Late portal venous phase imaging demonstrates patency of the main, right and left portal veins. Celiac arteriogram re-demonstrates an accessory right hepatic artery supplying the medial aspect of the right lobe of the liver. 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 Successful split dose administration of a total of 5 mCi of technetium 49mMAA via the right hepatic and accessory right hepatic arteries. IMPRESSION: 1. Successful pre  Y-90 arteriogram with percutaneous coil embolization of the pancreaticoduodenal artery. 2. Successful split dose administration of a total of 5 mCi of technetium 9104mAA via the right hepatic and accessory right hepatic arteries. Awaiting results of nuclear medicine liver scan. PLAN: Pending the results of the nuclear medicine liver scan, the patient will return Y90 radioembolization the right hepatic artery. Pending the patient's recovery from this initial treatment session, the patient will return for radioembolization of the accessory right hepatic artery. Electronically Signed   By: JoSandi Mariscal.D.   On: 12/08/2017 13:45   Ir Angiogram Selective Each Additional Vessel  Result Date: 12/08/2017 INDICATION: History of multifocal hepatocellular carcinoma. Patient presents today for Y 90 mapping procedure. Please refer to formal consultation in the epic EMR dated 11/17/2017 for additional details. EXAM: 1. ULTRASOUND GUIDANCE FOR ARTERIAL ACCESS 2. CELIAC AND SUPERIOR MESENTERIC ARTERIOGRAM (1st ORDER) 3. SELECTIVE COMMON AND PROPER HEPATIC ARTERIOGRAMS 4. SELECTIVE ARTERIOGRAM OF ACCESSORY PANCREATICODUODENAL ARTERY AND PERCUTANEOUS COIL EMBOLIZATION 5. SELECTIVE ARTERIOGRAM OF ACCESSORY RIGHT HEPATIC ARTERY AND ADMINISTRATION OF [id number] 6. SELECTIVE ARTERIOGRAM OF THE RIGHT HEPATIC ARTERY AND ADMINISTRATION OF Tc99m47m COMPARISON:  Hepatic bland embolization - 07/25/2017; abdominal MRI - 11/01/2017; CTA of the abdomen pelvis - 07/07/2017 MEDICATIONS: None RADIOPHARMACEUTICALS:  A total of 5 mCi Tc99m 15mwas administered, 2 mCi via an accessory right hepatic artery and 3 mCi via the right hepatic artery. CONTRAST:  75 cc Isovue 300 ANESTHESIA/SEDATION: Moderate (conscious) sedation was employed during this procedure. A total of Versed 3 mg and Fentanyl 150 mcg was administered intravenously. Moderate Sedation Time: 59 minutes. The patient's  level of consciousness and vital signs were monitored continuously  by radiology nursing throughout the procedure under my direct supervision. FLUOROSCOPY TIME:  14 minutes, 48 seconds (584 mGy) ACCESS: Right common femoral artery; hemostasis achieved with manual compression. COMPLICATIONS: None immediate. TECHNIQUE: Informed written consent was obtained from the patient after a discussion of the risks, benefits and alternatives to treatment. Questions regarding the procedure were encouraged and answered. A timeout was performed prior to the initiation of the procedure. The right groin was prepped and draped in the usual sterile fashion, and a sterile drape was applied covering the operative field. Maximum barrier sterile technique with sterile gowns and gloves were used for the procedure. A timeout was performed prior to the initiation of the procedure. Local anesthesia was provided with 1% lidocaine. The right femoral head was marked fluoroscopically. Under ultrasound guidance, the right common femoral artery was accessed with a micropuncture kit after the overlying soft tissues were anesthetized with 1% lidocaine. An ultrasound image was saved for documentation purposes. The micropuncture sheath was exchanged for a 5 [place] vascular sheath over a Bentson wire. A closure arteriogram was performed through the side of the sheath confirming access within the right common femoral artery. Over a Bentson wire, a Mickelson catheter was advanced to the level of the thoracic aorta where it was back bled and flushed. The catheter was then utilized to select the superior mesenteric artery and a superior mesenteric arteriogram with delayed portal venous imaging was performed. The Mickelson catheter was then advanced cranially and utilized to select the celiac artery and a celiac arteriogram was performed. Utilizing a fathom 14 microwire, a regular renegade micro catheter was utilized to select common and proper hepatic arteries and selective arteriograms were performed. The Renegade  microcatheter was utilized to select an accessory pancreaticoduodenal artery noted to arise subjacent to the left hepatic artery. Selective arteriogram was performed confirming appropriate positioning and the vessel was subsequent percutaneously coil embolized with 3 overlapping 2 mm x 5 mm partial coils to near the vessel's origin. The microcatheter was retracted into the proper hepatic artery and a post embolization proper hepatic arteriogram was performed Next, the microcatheter was utilized to select the accessory right hepatic artery and accessory right hepatic arteriogram was performed. From this location, 2 mCi of technetium 99 MAA was administered. Next the microcatheter was advanced into the right hepatic artery and a selective right hepatic arteriogram was performed. From the location proximal to the vessels bifurcation, 3 mCi of technetium 99 MAA was administered. At this point, the procedure was terminated. All wires and catheters and sheaths were removed from the patient. Hemostasis was achieved at the right groin and access site with manual compression. A dressing was placed. The patient tolerated procedure well without immediate postprocedural complication. The patient was escorted to nuclear medicine department for planar imaging. FINDINGS: Selective superior mesenteric arteriogram was again negative for accessory or replaced hepatic arterial supply. Late portal venous phase imaging demonstrates patency of the main, right and left portal veins. Celiac arteriogram re-demonstrates an accessory right hepatic artery supplying the medial aspect of the right lobe of the liver. 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 Successful split dose administration of a total of 5 mCi of technetium 80mMAA via the right hepatic and accessory right hepatic arteries.  IMPRESSION: 1. Successful pre Y-90 arteriogram with percutaneous coil embolization of  the pancreaticoduodenal artery. 2. Successful split dose administration of a total of 5 mCi of technetium 50mMAA via the right hepatic and accessory right hepatic arteries. Awaiting results of nuclear medicine liver scan. PLAN: Pending the results of the nuclear medicine liver scan, the patient will return Y90 radioembolization the right hepatic artery. Pending the patient's recovery from this initial treatment session, the patient will return for radioembolization of the accessory right hepatic artery. Electronically Signed   By: JSandi MariscalM.D.   On: 12/08/2017 13:45   Ir Angiogram Selective Each Additional Vessel  Result Date: 12/08/2017 INDICATION: History of multifocal hepatocellular carcinoma. Patient presents today for Y 90 mapping procedure. Please refer to formal consultation in the epic EMR dated 11/17/2017 for additional details. EXAM: 1. ULTRASOUND GUIDANCE FOR ARTERIAL ACCESS 2. CELIAC AND SUPERIOR MESENTERIC ARTERIOGRAM (1st ORDER) 3. SELECTIVE COMMON AND PROPER HEPATIC ARTERIOGRAMS 4. SELECTIVE ARTERIOGRAM OF ACCESSORY PANCREATICODUODENAL ARTERY AND PERCUTANEOUS COIL EMBOLIZATION 5. SELECTIVE ARTERIOGRAM OF ACCESSORY RIGHT HEPATIC ARTERY AND ADMINISTRATION OF [id number] 6. SELECTIVE ARTERIOGRAM OF THE RIGHT HEPATIC ARTERY AND ADMINISTRATION OF Tc9946mA COMPARISON:  Hepatic bland embolization - 07/25/2017; abdominal MRI - 11/01/2017; CTA of the abdomen pelvis - 07/07/2017 MEDICATIONS: None RADIOPHARMACEUTICALS:  A total of 5 mCi Tc99m32m was administered, 2 mCi via an accessory right hepatic artery and 3 mCi via the right hepatic artery. CONTRAST:  75 cc Isovue 300 ANESTHESIA/SEDATION: Moderate (conscious) sedation was employed during this procedure. A total of Versed 3 mg and Fentanyl 150 mcg was administered intravenously. Moderate Sedation Time: 59 minutes. The patient's level of consciousness and vital  signs were monitored continuously by radiology nursing throughout the procedure under my direct supervision. FLUOROSCOPY TIME:  14 minutes, 48 seconds (584 mGy) ACCESS: Right common femoral artery; hemostasis achieved with manual compression. COMPLICATIONS: None immediate. TECHNIQUE: Informed written consent was obtained from the patient after a discussion of the risks, benefits and alternatives to treatment. Questions regarding the procedure were encouraged and answered. A timeout was performed prior to the initiation of the procedure. The right groin was prepped and draped in the usual sterile fashion, and a sterile drape was applied covering the operative field. Maximum barrier sterile technique with sterile gowns and gloves were used for the procedure. A timeout was performed prior to the initiation of the procedure. Local anesthesia was provided with 1% lidocaine. The right femoral head was marked fluoroscopically. Under ultrasound guidance, the right common femoral artery was accessed with a micropuncture kit after the overlying soft tissues were anesthetized with 1% lidocaine. An ultrasound image was saved for documentation purposes. The micropuncture sheath was exchanged for a 5 Fren[place]ular sheath over a Bentson wire. A closure arteriogram was performed through the side of the sheath confirming access within the right common femoral artery. Over a Bentson wire, a Mickelson catheter was advanced to the level of the thoracic aorta where it was back bled and flushed. The catheter was then utilized to select the superior mesenteric artery and a superior mesenteric arteriogram with delayed portal venous imaging was performed. The Mickelson catheter was then advanced cranially and utilized to select the celiac artery and a celiac arteriogram was performed. Utilizing a fathom 14 microwire, a regular renegade micro catheter was utilized to select common and proper hepatic arteries and selective arteriograms were  performed. The Renegade microcatheter was utilized to select an accessory pancreaticoduodenal artery noted to arise subjacent to the left hepatic artery. Selective arteriogram was performed confirming appropriate positioning and the vessel was  subsequent percutaneously coil embolized with 3 overlapping 2 mm x 5 mm partial coils to near the vessel's origin. The microcatheter was retracted into the proper hepatic artery and a post embolization proper hepatic arteriogram was performed Next, the microcatheter was utilized to select the accessory right hepatic artery and accessory right hepatic arteriogram was performed. From this location, 2 mCi of technetium 99 MAA was administered. Next the microcatheter was advanced into the right hepatic artery and a selective right hepatic arteriogram was performed. From the location proximal to the vessels bifurcation, 3 mCi of technetium 99 MAA was administered. At this point, the procedure was terminated. All wires and catheters and sheaths were removed from the patient. Hemostasis was achieved at the right groin and access site with manual compression. A dressing was placed. The patient tolerated procedure well without immediate postprocedural complication. The patient was escorted to nuclear medicine department for planar imaging. FINDINGS: Selective superior mesenteric arteriogram was again negative for accessory or replaced hepatic arterial supply. Late portal venous phase imaging demonstrates patency of the main, right and left portal veins. Celiac arteriogram re-demonstrates an accessory right hepatic artery supplying the medial aspect of the right lobe of the liver. 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 Successful split dose administration of a total of 5 mCi of technetium 47mMAA via the right hepatic and accessory  right hepatic arteries. IMPRESSION: 1. Successful pre Y-90 arteriogram with percutaneous coil embolization of the pancreaticoduodenal artery. 2. Successful split dose administration of a total of 5 mCi of technetium 965mAA via the right hepatic and accessory right hepatic arteries. Awaiting results of nuclear medicine liver scan. PLAN: Pending the results of the nuclear medicine liver scan, the patient will return Y90 radioembolization the right hepatic artery. Pending the patient's recovery from this initial treatment session, the patient will return for radioembolization of the accessory right hepatic artery. Electronically Signed   By: JoSandi Mariscal.D.   On: 12/08/2017 13:45   Ir Angiogram Selective Each Additional Vessel  Result Date: 12/08/2017 INDICATION: History of multifocal hepatocellular carcinoma. Patient presents today for Y 90 mapping procedure. Please refer to formal consultation in the epic EMR dated 11/17/2017 for additional details. EXAM: 1. ULTRASOUND GUIDANCE FOR ARTERIAL ACCESS 2. CELIAC AND SUPERIOR MESENTERIC ARTERIOGRAM (1st ORDER) 3. SELECTIVE COMMON AND PROPER HEPATIC ARTERIOGRAMS 4. SELECTIVE ARTERIOGRAM OF ACCESSORY PANCREATICODUODENAL ARTERY AND PERCUTANEOUS COIL EMBOLIZATION 5. SELECTIVE ARTERIOGRAM OF ACCESSORY RIGHT HEPATIC ARTERY AND ADMINISTRATION OF Tc9950mA 6. SELECTIVE ARTERIOGRAM OF THE RIGHT HEPATIC ARTERY AND ADMINISTRATION OF Tc99m39m COMPARISON:  Hepatic bland embolization - 07/25/2017; abdominal MRI - 11/01/2017; CTA of the abdomen pelvis - 07/07/2017 MEDICATIONS: None RADIOPHARMACEUTICALS:  A total of 5 mCi Tc99m 40mwas administered, 2 mCi via an accessory right hepatic artery and 3 mCi via the right hepatic artery. CONTRAST:  75 cc Isovue 300 ANESTHESIA/SEDATION: Moderate (conscious) sedation was employed during this procedure. A total of Versed 3 mg and Fentanyl 150 mcg was administered intravenously. Moderate Sedation Time: 59 minutes. The patient's level of  consciousness and vital signs were monitored continuously by radiology nursing throughout the procedure under my direct supervision. FLUOROSCOPY TIME:  14 minutes, 48 seconds (584 mGy) ACCESS: Right common femoral artery; hemostasis achieved with manual compression. COMPLICATIONS: None immediate. TECHNIQUE: Informed written consent was obtained from the patient after a discussion of the risks, benefits and alternatives  to treatment. Questions regarding the procedure were encouraged and answered. A timeout was performed prior to the initiation of the procedure. The right groin was prepped and draped in the usual sterile fashion, and a sterile drape was applied covering the operative field. Maximum barrier sterile technique with sterile gowns and gloves were used for the procedure. A timeout was performed prior to the initiation of the procedure. Local anesthesia was provided with 1% lidocaine. The right femoral head was marked fluoroscopically. Under ultrasound guidance, the right common femoral artery was accessed with a micropuncture kit after the overlying soft tissues were anesthetized with 1% lidocaine. An ultrasound image was saved for documentation purposes. The micropuncture sheath was exchanged for a 5 [place] vascular sheath over a Bentson wire. A closure arteriogram was performed through the side of the sheath confirming access within the right common femoral artery. Over a Bentson wire, a Mickelson catheter was advanced to the level of the thoracic aorta where it was back bled and flushed. The catheter was then utilized to select the superior mesenteric artery and a superior mesenteric arteriogram with delayed portal venous imaging was performed. The Mickelson catheter was then advanced cranially and utilized to select the celiac artery and a celiac arteriogram was performed. Utilizing a fathom 14 microwire, a regular renegade micro catheter was utilized to select common and proper hepatic arteries and  selective arteriograms were performed. The Renegade microcatheter was utilized to select an accessory pancreaticoduodenal artery noted to arise subjacent to the left hepatic artery. Selective arteriogram was performed confirming appropriate positioning and the vessel was subsequent percutaneously coil embolized with 3 overlapping 2 mm x 5 mm partial coils to near the vessel's origin. The microcatheter was retracted into the proper hepatic artery and a post embolization proper hepatic arteriogram was performed Next, the microcatheter was utilized to select the accessory right hepatic artery and accessory right hepatic arteriogram was performed. From this location, 2 mCi of technetium 99 MAA was administered. Next the microcatheter was advanced into the right hepatic artery and a selective right hepatic arteriogram was performed. From the location proximal to the vessels bifurcation, 3 mCi of technetium 99 MAA was administered. At this point, the procedure was terminated. All wires and catheters and sheaths were removed from the patient. Hemostasis was achieved at the right groin and access site with manual compression. A dressing was placed. The patient tolerated procedure well without immediate postprocedural complication. The patient was escorted to nuclear medicine department for planar imaging. FINDINGS: Selective superior mesenteric arteriogram was again negative for accessory or replaced hepatic arterial supply. Late portal venous phase imaging demonstrates patency of the main, right and left portal veins. Celiac arteriogram re-demonstrates an accessory right hepatic artery supplying the medial aspect of the right lobe of the liver. 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 Successful split dose administration of a total of 5 mCi of technetium 70mMAA via the  right hepatic and accessory right hepatic arteries. IMPRESSION: 1. Successful pre Y-90 arteriogram with percutaneous coil embolization of the pancreaticoduodenal artery. 2. Successful split dose administration of a total of 5 mCi of technetium 929mAA via the right hepatic and accessory right hepatic arteries. Awaiting results of nuclear medicine liver scan. PLAN: Pending the results of the nuclear medicine liver scan, the patient will return Y90 radioembolization the right hepatic artery. Pending the patient's recovery from this  initial treatment session, the patient will return for radioembolization of the accessory right hepatic artery. Electronically Signed   By: Sandi Mariscal M.D.   On: 12/08/2017 13:45   Ir Angiogram Selective Each Additional Vessel  Result Date: 12/08/2017 INDICATION: History of multifocal hepatocellular carcinoma. Patient presents today for Y 90 mapping procedure. Please refer to formal consultation in the epic EMR dated 11/17/2017 for additional details. EXAM: 1. ULTRASOUND GUIDANCE FOR ARTERIAL ACCESS 2. CELIAC AND SUPERIOR MESENTERIC ARTERIOGRAM (1st ORDER) 3. SELECTIVE COMMON AND PROPER HEPATIC ARTERIOGRAMS 4. SELECTIVE ARTERIOGRAM OF ACCESSORY PANCREATICODUODENAL ARTERY AND PERCUTANEOUS COIL EMBOLIZATION 5. SELECTIVE ARTERIOGRAM OF ACCESSORY RIGHT HEPATIC ARTERY AND ADMINISTRATION OF Tc48mMAA 6. SELECTIVE ARTERIOGRAM OF THE RIGHT HEPATIC ARTERY AND ADMINISTRATION OF Tc973mAA COMPARISON:  Hepatic bland embolization - 07/25/2017; abdominal MRI - 11/01/2017; CTA of the abdomen pelvis - 07/07/2017 MEDICATIONS: None RADIOPHARMACEUTICALS:  A total of 5 mCi Tc9961mA was administered, 2 mCi via an accessory right hepatic artery and 3 mCi via the right hepatic artery. CONTRAST:  75 cc Isovue 300 ANESTHESIA/SEDATION: Moderate (conscious) sedation was employed during this procedure. A total of Versed 3 mg and Fentanyl 150 mcg was administered intravenously. Moderate Sedation Time: 59 minutes.  The patient's level of consciousness and vital signs were monitored continuously by radiology nursing throughout the procedure under my direct supervision. FLUOROSCOPY TIME:  14 minutes, 48 seconds (584 mGy) ACCESS: Right common femoral artery; hemostasis achieved with manual compression. COMPLICATIONS: None immediate. TECHNIQUE: Informed written consent was obtained from the patient after a discussion of the risks, benefits and alternatives to treatment. Questions regarding the procedure were encouraged and answered. A timeout was performed prior to the initiation of the procedure. The right groin was prepped and draped in the usual sterile fashion, and a sterile drape was applied covering the operative field. Maximum barrier sterile technique with sterile gowns and gloves were used for the procedure. A timeout was performed prior to the initiation of the procedure. Local anesthesia was provided with 1% lidocaine. The right femoral head was marked fluoroscopically. Under ultrasound guidance, the right common femoral artery was accessed with a micropuncture kit after the overlying soft tissues were anesthetized with 1% lidocaine. An ultrasound image was saved for documentation purposes. The micropuncture sheath was exchanged for a 5 FrePakistanscular sheath over a Bentson wire. A closure arteriogram was performed through the side of the sheath confirming access within the right common femoral artery. Over a Bentson wire, a Mickelson catheter was advanced to the level of the thoracic aorta where it was back bled and flushed. The catheter was then utilized to select the superior mesenteric artery and a superior mesenteric arteriogram with delayed portal venous imaging was performed. The Mickelson catheter was then advanced cranially and utilized to select the celiac artery and a celiac arteriogram was performed. Utilizing a fathom 14 microwire, a regular renegade micro catheter was utilized to select common and proper  hepatic arteries and selective arteriograms were performed. The Renegade microcatheter was utilized to select an accessory pancreaticoduodenal artery noted to arise subjacent to the left hepatic artery. Selective arteriogram was performed confirming appropriate positioning and the vessel was subsequent percutaneously coil embolized with 3 overlapping 2 mm x 5 mm partial coils to near the vessel's origin. The microcatheter was retracted into the proper hepatic artery and a post embolization proper hepatic arteriogram was performed Next, the microcatheter was utilized to select the accessory right hepatic artery and accessory right hepatic arteriogram was performed. From this location, 2  mCi of technetium 99 MAA was administered. Next the microcatheter was advanced into the right hepatic artery and a selective right hepatic arteriogram was performed. From the location proximal to the vessels bifurcation, 3 mCi of technetium 99 MAA was administered. At this point, the procedure was terminated. All wires and catheters and sheaths were removed from the patient. Hemostasis was achieved at the right groin and access site with manual compression. A dressing was placed. The patient tolerated procedure well without immediate postprocedural complication. The patient was escorted to nuclear medicine department for planar imaging. FINDINGS: Selective superior mesenteric arteriogram was again negative for accessory or replaced hepatic arterial supply. Late portal venous phase imaging demonstrates patency of the main, right and left portal veins. Celiac arteriogram re-demonstrates an accessory right hepatic artery supplying the medial aspect of the right lobe of the liver. 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 Successful split dose administration of a total of 5 mCi of  technetium 60mMAA via the right hepatic and accessory right hepatic arteries. IMPRESSION: 1. Successful pre Y-90 arteriogram with percutaneous coil embolization of the pancreaticoduodenal artery. 2. Successful split dose administration of a total of 5 mCi of technetium 958mAA via the right hepatic and accessory right hepatic arteries. Awaiting results of nuclear medicine liver scan. PLAN: Pending the results of the nuclear medicine liver scan, the patient will return Y90 radioembolization the right hepatic artery. Pending the patient's recovery from this initial treatment session, the patient will return for radioembolization of the accessory right hepatic artery. Electronically Signed   By: JoSandi Mariscal.D.   On: 12/08/2017 13:45   Ir Fluoro Rm 30-60 Min  Result Date: 12/09/2017 CLINICAL DATA:  Patient underwent technically successful mapping Y 90 radioembolization earlier today however prior to discharge developed acute onset of right lower abdominal pain. Subsequent CTA demonstrated small area of persistent extravasation at the right common femoral access site. Please perform fluoroscopic assisted localization of the access site of the right common femoral artery prior to continued manual compression in hopes of achieving hemostasis. EXAM: IR FLOURO RM 0-60 MIN TECHNIQUE: Patient was placed supine on the fluoroscopy table and the approximate location of the right common femoral arterial access site was marked fluoroscopically. Next, approximately 30 minutes of additional manual compression was performed at this location. Patient reports subjective improvement in his right lower abdominal pain and has remained hemodynamically stable throughout this prolonged observation in interventional radiology department. CONTRAST:  None FLUOROSCOPY TIME:  12 seconds COMPARISON:  Mapping Y 90 radioembolization - earlier same day; CT abdomen pelvis - earlier same day FINDINGS: Patient was placed supine on the fluoroscopy  table and the approximate location of the right common femoral arterial access site was marked fluoroscopically. Next, approximately 30 minutes of additional manual compression was performed at this location. Patient reports subjective improvement in his right lower abdominal pain and has remained hemodynamically stable throughout this prolonged observation in interventional radiology department. IMPRESSION: Successful fluoroscopic guided marking of the right femoral head followed by additional 30 minutes of manual compression at the right common femoral arterial access site. Electronically Signed   By: JoSandi Mariscal.D.   On: 12/09/2017 13:48   Ir [place] Vasc Access Right  Result Date: 12/08/2017 INDICATION: History of multifocal hepatocellular carcinoma. Patient presents today for Y 90 mapping procedure. Please refer to formal consultation in the  [facility] EMR dated 11/17/2017 for additional details. EXAM: 1. ULTRASOUND GUIDANCE FOR ARTERIAL ACCESS 2. CELIAC AND SUPERIOR MESENTERIC ARTERIOGRAM (1st ORDER) 3. SELECTIVE COMMON AND PROPER HEPATIC ARTERIOGRAMS 4. SELECTIVE ARTERIOGRAM OF ACCESSORY PANCREATICODUODENAL ARTERY AND PERCUTANEOUS COIL EMBOLIZATION 5. SELECTIVE ARTERIOGRAM OF ACCESSORY RIGHT HEPATIC ARTERY AND ADMINISTRATION OF Tc25mMAA 6. SELECTIVE ARTERIOGRAM OF THE RIGHT HEPATIC ARTERY AND ADMINISTRATION OF Tc921mAA COMPARISON:  Hepatic bland embolization - 07/25/2017; abdominal MRI - 11/01/2017; CTA of the abdomen pelvis - 07/07/2017 MEDICATIONS: None RADIOPHARMACEUTICALS:  A total of 5 mCi Tc9961mA was administered, 2 mCi via an accessory right hepatic artery and 3 mCi via the right hepatic artery. CONTRAST:  75 cc Isovue 300 ANESTHESIA/SEDATION: Moderate (conscious) sedation was employed during this procedure. A total of Versed 3 mg and Fentanyl 150 mcg was administered intravenously. Moderate Sedation Time: 59 minutes. The patient's level of consciousness and vital signs were monitored continuously  by radiology nursing throughout the procedure under my direct supervision. FLUOROSCOPY TIME:  14 minutes, 48 seconds (584 mGy) ACCESS: Right common femoral artery; hemostasis achieved with manual compression. COMPLICATIONS: None immediate. TECHNIQUE: Informed written consent was obtained from the patient after a discussion of the risks, benefits and alternatives to treatment. Questions regarding the procedure were encouraged and answered. A timeout was performed prior to the initiation of the procedure. The right groin was prepped and draped in the usual sterile fashion, and a sterile drape was applied covering the operative field. Maximum barrier sterile technique with sterile gowns and gloves were used for the procedure. A timeout was performed prior to the initiation of the procedure. Local anesthesia was provided with 1% lidocaine. The right femoral head was marked fluoroscopically. Under ultrasound guidance, the right common femoral artery was accessed with a micropuncture kit after the overlying soft tissues were anesthetized with 1% lidocaine. An ultrasound image was saved for documentation purposes. The micropuncture sheath was exchanged for a 5 FrePakistanscular sheath over a Bentson wire. A closure arteriogram was performed through the side of the sheath confirming access within the right common femoral artery. Over a Bentson wire, a Mickelson catheter was advanced to the level of the thoracic aorta where it was back bled and flushed. The catheter was then utilized to select the superior mesenteric artery and a superior mesenteric arteriogram with delayed portal venous imaging was performed. The Mickelson catheter was then advanced cranially and utilized to select the celiac artery and a celiac arteriogram was performed. Utilizing a fathom 14 microwire, a regular renegade micro catheter was utilized to select common and proper hepatic arteries and selective arteriograms were performed. The Renegade  microcatheter was utilized to select an accessory pancreaticoduodenal artery noted to arise subjacent to the left hepatic artery. Selective arteriogram was performed confirming appropriate positioning and the vessel was subsequent percutaneously coil embolized with 3 overlapping 2 mm x 5 mm partial coils to near the vessel's origin. The microcatheter was retracted into the proper hepatic artery and a post embolization proper hepatic arteriogram was performed Next, the microcatheter was utilized to select the accessory right hepatic artery and accessory right hepatic arteriogram was performed. From this location, 2 mCi of technetium 99 MAA was administered. Next the microcatheter was advanced into the right hepatic artery and a selective right hepatic arteriogram was performed. From the location proximal to the vessels bifurcation, 3 mCi of technetium 99 MAA was administered. At this point, the procedure was terminated. All wires and catheters and sheaths were removed from the patient. Hemostasis was achieved  at the right groin and access site with manual compression. A dressing was placed. The patient tolerated procedure well without immediate postprocedural complication. The patient was escorted to nuclear medicine department for planar imaging. FINDINGS: Selective superior mesenteric arteriogram was again negative for accessory or replaced hepatic arterial supply. Late portal venous phase imaging demonstrates patency of the main, right and left portal veins. Celiac arteriogram re-demonstrates an accessory right hepatic artery supplying the medial aspect of the right lobe of the liver. 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 Successful split dose administration of a total of 5 mCi of technetium 2mMAA via the right hepatic and accessory right hepatic arteries.  IMPRESSION: 1. Successful pre Y-90 arteriogram with percutaneous coil embolization of the pancreaticoduodenal artery. 2. Successful split dose administration of a total of 5 mCi of technetium 931mAA via the right hepatic and accessory right hepatic arteries. Awaiting results of nuclear medicine liver scan. PLAN: Pending the results of the nuclear medicine liver scan, the patient will return Y90 radioembolization the right hepatic artery. Pending the patient's recovery from this initial treatment session, the patient will return for radioembolization of the accessory right hepatic artery. Electronically Signed   By: JoSandi Mariscal.D.   On: 12/08/2017 13:45   Ir Embo Arterial Not [place]  Result Date: 12/08/2017 INDICATION: History of multifocal hepatocellular carcinoma. Patient presents today for Y 90 mapping procedure. Please refer to formal consultation in the epic EMR dated 11/17/2017 for additional details. EXAM: 1. ULTRASOUND GUIDANCE FOR ARTERIAL ACCESS 2. CELIAC AND SUPERIOR MESENTERIC ARTERIOGRAM (1st ORDER) 3. SELECTIVE COMMON AND PROPER HEPATIC ARTERIOGRAMS 4. SELECTIVE ARTERIOGRAM OF ACCESSORY PANCREATICODUODENAL ARTERY AND PERCUTANEOUS COIL EMBOLIZATION 5. SELECTIVE ARTERIOGRAM OF ACCESSORY RIGHT HEPATIC ARTERY AND ADMINISTRATION OF Tc9980mA 6. SELECTIVE ARTERIOGRAM OF THE RIGHT HEPATIC ARTERY AND ADMINISTRATION OF Tc99m48m COMPARISON:  Hepatic bland embolization - 07/25/2017; abdominal MRI - 11/01/2017; CTA of the abdomen pelvis - 07/07/2017 MEDICATIONS: None RADIOPHARMACEUTICALS:  A total of 5 mCi Tc99m 75mwas administered, 2 mCi via an accessory right hepatic artery and 3 mCi via the right hepatic artery. CONTRAST:  75 cc Isovue 300 ANESTHESIA/SEDATION: Moderate (conscious) sedation was employed during this procedure. A total of Versed 3 mg and Fentanyl 150 mcg was administered intravenously. Moderate Sedation Time: 59 minutes. The patient's level of consciousness and  vital signs were monitored continuously by radiology nursing throughout the procedure under my direct supervision. FLUOROSCOPY TIME:  14 minutes, 48 seconds (584 mGy) ACCESS: Right common femoral artery; hemostasis achieved with manual compression. COMPLICATIONS: None immediate. TECHNIQUE: Informed written consent was obtained from the patient after a discussion of the risks, benefits and alternatives to treatment. Questions regarding the procedure were encouraged and answered. A timeout was performed prior to the initiation of the procedure. The right groin was prepped and draped in the usual sterile fashion, and a sterile drape was applied covering the operative field. Maximum barrier sterile technique with sterile gowns and gloves were used for the procedure. A timeout was performed prior to the initiation of the procedure. Local anesthesia was provided with 1% lidocaine. The right femoral head was marked fluoroscopically. Under ultrasound guidance, the right common femoral artery was accessed with a micropuncture kit after the overlying soft tissues were anesthetized with 1% lidocaine. An ultrasound image was saved for documentation purposes. The micropuncture sheath was exchanged  for a 5 [place] vascular sheath over a Bentson wire. A closure arteriogram was performed through the side of the sheath confirming access within the right common femoral artery. Over a Bentson wire, a Mickelson catheter was advanced to the level of the thoracic aorta where it was back bled and flushed. The catheter was then utilized to select the superior mesenteric artery and a superior mesenteric arteriogram with delayed portal venous imaging was performed. The Mickelson catheter was then advanced cranially and utilized to select the celiac artery and a celiac arteriogram was performed. Utilizing a fathom 14 microwire, a regular renegade micro catheter was utilized to select common and proper hepatic arteries and selective  arteriograms were performed. The Renegade microcatheter was utilized to select an accessory pancreaticoduodenal artery noted to arise subjacent to the left hepatic artery. Selective arteriogram was performed confirming appropriate positioning and the vessel was subsequent percutaneously coil embolized with 3 overlapping 2 mm x 5 mm partial coils to near the vessel's origin. The microcatheter was retracted into the proper hepatic artery and a post embolization proper hepatic arteriogram was performed Next, the microcatheter was utilized to select the accessory right hepatic artery and accessory right hepatic arteriogram was performed. From this location, 2 mCi of technetium 99 MAA was administered. Next the microcatheter was advanced into the right hepatic artery and a selective right hepatic arteriogram was performed. From the location proximal to the vessels bifurcation, 3 mCi of technetium 99 MAA was administered. At this point, the procedure was terminated. All wires and catheters and sheaths were removed from the patient. Hemostasis was achieved at the right groin and access site with manual compression. A dressing was placed. The patient tolerated procedure well without immediate postprocedural complication. The patient was escorted to nuclear medicine department for planar imaging. FINDINGS: Selective superior mesenteric arteriogram was again negative for accessory or replaced hepatic arterial supply. Late portal venous phase imaging demonstrates patency of the main, right and left portal veins. Celiac arteriogram re-demonstrates an accessory right hepatic artery supplying the medial aspect of the right lobe of the liver. 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 Successful split dose administration of a total of 5 mCi of technetium 72mMAA via the right  hepatic and accessory right hepatic arteries. IMPRESSION: 1. Successful pre Y-90 arteriogram with percutaneous coil embolization of the pancreaticoduodenal artery. 2. Successful split dose administration of a total of 5 mCi of technetium 958mAA via the right hepatic and accessory right hepatic arteries. Awaiting results of nuclear medicine liver scan. PLAN: Pending the results of the nuclear medicine liver scan, the patient will return Y90 radioembolization the right hepatic artery. Pending the patient's recovery from this initial treatment session, the patient will return for radioembolization of the accessory right hepatic artery. Electronically Signed   By: JoSandi Mariscal.D.   On: 12/08/2017 13:45   Ir Radiologist Eval & Mgmt  Result Date: 11/17/2017 Please refer to notes tab for details about interventional procedure. (Op Note)  Ct Angio Abd/pel W/ And/or W/o  Result Date: 12/08/2017 CLINICAL DATA:  Mapping Y 90 radioembolization earlier today, now with right lower abdominal pain. Evaluate for hematoma/access site complication. EXAM: CTA ABDOMEN AND PELVIS WITH CONTRAST TECHNIQUE: Multidetector CT imaging of the abdomen and pelvis was performed using the standard protocol during bolus administration of intravenous contrast. Multiplanar reconstructed images and MIPs were obtained and  reviewed to evaluate the vascular anatomy. CONTRAST:  20m ISOVUE-370 IOPAMIDOL (ISOVUE-370) INJECTION 76% COMPARISON:  Abdominal MRI-11/01/2017; CT abdomen pelvis - 07/07/2017 FINDINGS: VASCULAR Aorta: Scattered mixed calcified and noncalcified atherosclerotic plaque with a normal caliber abdominal aorta, not resulting in hemodynamically significant stenosis. Celiac: There is a minimal amount of atherosclerotic plaque involving the origin the celiac artery, not resulting in hemodynamically significant stenosis. Sequela of coil embolization the pancreaticoduodenal artery. SMA: There is a moderate amount of mixed calcified and  noncalcified atherosclerotic plaque involving the origin of the SMA which approaches of approximately 50% luminal narrowing. Renals: Solitary bilaterally. There is a minimal amount of calcified atherosclerotic plaque involving the origin of left renal artery, not resulting in a hemodynamically significant stenosis. No vessel irregularity to suggest FMD. IMA: Remains widely patent. Pelvic inflow: There is a minimal amount of calcified atherosclerotic plaque involving the bilateral common iliac arteries, not resulting in a hemodynamically significant stenosis. The bilateral common iliac arteries are diseased though of normal caliber. The bilateral external iliac arteries are of normal caliber and widely patent. Right-sided proximal outflow: There is a minimal amount of mixed calcified and noncalcified atherosclerotic plaque within the right common femoral artery. There is a tiny area of active extravasation at the location of the right common femoral artery access site (representative images 23 through 25) with associated hematoma tracking into the right lower pelvis and abdomen. No definitive retroperitoneal hematoma. The adjacent inferior epigastric artery appears widely patent as does the right deep iliac circumflex artery. No vessel dissection. The imaged portions of the right superficial and deep femoral artery appear widely patent. Left-sided proximal outflow: The left common femoral artery is widely patent without hemodynamically significant stenosis. There is a minimal amount of calcified atherosclerotic plaque involving the origin of the left superficial femoral artery, not resulting in hemodynamically significant stenosis. The left deep femoral artery is widely patent throughout its imaged course. Veins: The pelvic venous system and IVC appear widely patent. Review of the MIP images confirms the above findings. NON-VASCULAR Lower chest: Limited visualization of the lower thorax is negative for focal airspace  opacity or pleural effusion. Coronary artery calcifications.  No pericardial effusion. 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 Re demonstrated ill-defined area of hyperenhancement adjacent to the ablation zone within the anterior segment of the right lobe of the liver (image 23, series 2 as well as nodular areas of enhancement within the right lobe of the liver (images 21, 24 and 34) compatible with known multifocal hepatocellular carcinoma. The portal vein appears widely patent. Normal appearance of the gallbladder.  No radiopaque gallstones. Pancreas: Normal appearance of the pancreas Spleen: Normal appearance of the spleen Adrenals/Urinary Tract: Excreted contrast from a prior arteriogram seen within bilateral renal collecting system. No definite renal stones. No discrete renal lesions. No urinary obstruction or perinephric stranding. Excreted contrast seen within the urinary bladder. Stomach/Bowel: There is minimal mass effect upon the cecum a distal sigmoid colon secondary to the right lower abdominal hematoma. The bowel is otherwise normal in course and caliber without wall thickening or evidence of enteric obstruction. No pneumoperitoneum, pneumatosis or portal venous gas. Lymphatic: No bulky porta hepatis, retroperitoneal, mesenteric, pelvic or inguinal lymphadenopathy. Reproductive: Dystrophic calcifications within normal sized prostate gland. No free fluid in the pelvic cul-de-sac. Other: Minimal amount of expected stranding within the right groin regional to the access site. Musculoskeletal: No acute or aggressive osseous abnormalities. Stigmata of DISH within the lower thoracic spine. Mild-to-moderate multilevel lumbar spine DDD,  worse at a definitive 3 and L3-L4 with disc space height loss, endplate irregularity and sclerosis. Bilateral facet degenerative change within the lower lumbar spine. Bone island is noted involving the posterior aspect the right ilium,  unchanged. IMPRESSION: VASCULAR 1. Tiny area of active extravasation from the right common femoral artery access site with associated hematoma extending from the right pelvis to the right lower abdomen. Patient was subsequently brought to the interventional radiology suite and approximately 30 additional minutes of manual compression was applied. 2.  Aortic Atherosclerosis (ICD10-I70.0). NON-VASCULAR 1. Otherwise, no acute findings within the abdomen or pelvis. 2. Similar findings of hepatic cirrhosis and multifocal hepatocellular carcinoma as seen on recent abdominal MRI. Electronically Signed   By: Sandi Mariscal M.D.   On: 12/08/2017 16:53   Nm Fusion  Result Date: 12/08/2017 CLINICAL DATA:  Pre yttrium 90 radioembolization evaluation. EXAM: NUCLEAR MEDICINE LIVER SCAN; ULTRASOUND MISCELLANEOUS SOFT TISSUE TECHNIQUE: Abdominal images were obtained in multiple projections after intrahepatic arterial injection of radiopharmaceutical. SPECT imaging was performed. Lung shunt calculation was performed. RADIOPHARMACEUTICALS:  4.25mllicurie MAA TECHNETIUM TO 82M ALBUMIN AGGREGATED COMPARISON:  MRI 06/28/2017, angiography 12/08/2017 FINDINGS: The injected microaggregated albumin localizes within the RIGHT hepatic lobe. Small focus of activity in the porta hepatis. No evidence of activity within the stomach, duodenum, or bowel. Calculated shunt fraction to the lungs equals 8.0%. IMPRESSION: 1. Injected MAA tracer activity localizes to the RIGHT hepatic lobe. 2. Small focus of radiotracer activity within the porta hepatis. Recommend catheter angiography evaluation at time of treatment. 3. Lung shunt fraction equals 8.0%. Findings conveyed toJOHN Ferguson on 12/08/2017  at16:08. Electronically Signed   By: SSuzy BouchardM.D.   On: 12/08/2017 16:09    ECHOCARDIOGRAM ------------------------------------------------------------------- Study Conclusions  - Left ventricle: The cavity size was normal. Wall thickness  was increased in a pattern of mild LVH. Systolic function was normal. The estimated ejection fraction was in the range of 60% to 65%. Wall motion was normal; there were no regional wall motion abnormalities. Doppler parameters are consistent with abnormal left ventricular relaxation (grade 1 diastolic dysfunction). The E/e&' ratio is between 8-15, suggesting indeterminate LV filling pressure. - Mitral valve: Mildly thickened leaflets . There was mild regurgitation. - Left atrium: The atrium was normal in size. - Tricuspid valve: There was trivial regurgitation. - Pulmonary arteries: PA peak pressure: 29 mm Hg (S). - Inferior vena cava: The vessel was normal in size. The respirophasic diameter changes were in the normal range (= 50%), consistent with normal central venous pressure.  Impressions:  - LVEF 60-65%, mild LVH, normal wall motion, grade 1 DD, indeterminate LV filling pressure, mild MR, normal LA size, trivial TR, RVSP 29 mmHg, normal IVC.  Subjective: Examined at bedside and was doing well.  Did not feel lightheaded or dizzy.  Felt well and had no other complaints or concerns and denies any chest pain, nausea, vomiting.  Ready to go home  Discharge Exam: Vitals:   12/10/17 1449 12/10/17 1451  BP: 135/67 (!) 153/70  Pulse: 74 73  Resp:    Temp:    SpO2: 100% 100%   Vitals:   12/10/17 1445 12/10/17 1447 12/10/17 1449 12/10/17 1451  BP: (!) 145/64 (!) 148/71 135/67 (!) 153/70  Pulse: 73 72 74 73  Resp:      Temp:      TempSrc:      SpO2: 100% 100% 100% 100%  Weight:      Height:       General: Pt is  alert, awake, not in acute distress Cardiovascular: RRR, S1/S2 +, no rubs, no gallops Respiratory: Diminished bilaterally, no wheezing, no rhonchi Abdominal: Soft, Mildly tender, ND, bowel sounds + Extremities: no edema, no cyanosis  The results of significant diagnostics from this hospitalization (including imaging, microbiology,  ancillary and laboratory) are listed below for reference.    Microbiology: No results found for this or any previous visit (from the past 240 hour(s)).   Labs: BNP (last 3 results) No results for input(s): BNP in the last 8760 hours. Basic Metabolic Panel: Recent Labs  Lab 12/08/17 0803 12/09/17 0520 12/10/17 0249  NA 138 139 140  K 5.1 4.3 4.1  CL 100* 104 109  CO2 25 21* 23  GLUCOSE 83 95 110*  BUN '11 13 9  ' CREATININE 1.07 0.88 0.94  CALCIUM 9.5 9.0 8.5*  MG  --   --  1.4*  PHOS  --   --  2.3*   Liver Function Tests: Recent Labs  Lab 12/08/17 0803 12/09/17 0520 12/10/17 0249  AST 75* 43* 40  ALT 45 31 25  ALKPHOS 124 91 83  BILITOT 1.6* 1.8* 1.5*  PROT 8.5* 6.6 6.1*  ALBUMIN 5.1* 4.0 3.6   No results for input(s): LIPASE, AMYLASE in the last 168 hours. No results for input(s): AMMONIA in the last 168 hours. CBC: Recent Labs  Lab 12/08/17 0803  12/09/17 0520 12/09/17 1352 12/09/17 1948 12/10/17 0249 12/10/17 0910  WBC 6.5   < > 6.6 7.0 6.1 6.4 6.7  NEUTROABS 2.3  --   --  2.7 2.5 2.8 2.8  HGB 14.4   < > 11.0* 10.0* 9.6* 9.0* 9.0*  HCT 41.1   < > 31.7* 28.9* 28.4* 26.3* 26.1*  MCV 96.3   < > 94.9 95.7 95.6 94.6 94.9  PLT 83*   < > 62* 52* 59* 53* 47*   < > = values in this interval not displayed.   Cardiac Enzymes: Recent Labs  Lab 12/09/17 1351 12/09/17 1948 12/10/17 0249  TROPONINI <0.03 <0.03 <0.03   BNP: Invalid input(s): POCBNP CBG: Recent Labs  Lab 12/08/17 1826 12/09/17 0742 12/10/17 0800  GLUCAP 76 88 155*   D-Dimer No results for input(s): DDIMER in the last 72 hours. Hgb A1c No results for input(s): HGBA1C in the last 72 hours. Lipid Profile No results for input(s): CHOL, HDL, LDLCALC, TRIG, CHOLHDL, LDLDIRECT in the last 72 hours. Thyroid function studies Recent Labs    12/09/17 1352  TSH 0.548   Anemia work up No results for input(s): VITAMINB12, FOLATE, FERRITIN, TIBC, IRON, RETICCTPCT in the last 72  hours. Urinalysis No results found for: COLORURINE, APPEARANCEUR, LABSPEC, [place], GLUCOSEU, HGBUR, BILIRUBINUR, KETONESUR, PROTEINUR, UROBILINOGEN, NITRITE, LEUKOCYTESUR Sepsis Labs Invalid input(s): PROCALCITONIN,  WBC,  LACTICIDVEN Microbiology No results found for this or any previous visit (from the past 240 hour(s)).  Time coordinating discharge: 35 minutes  SIGNED:  Kerney Elbe, DO [facility] 12/10/2017, 3:06 PM Pager [phone]  If 7PM-7AM, please contact night-coverage [email or web address] Password [email or web address]

## 2017-12-15 ENCOUNTER — Emergency Department (HOSPITAL_BASED_OUTPATIENT_CLINIC_OR_DEPARTMENT_OTHER)
Admit: 2017-12-15 | Discharge: 2017-12-15 | Disposition: A | Discharge status: Home / Self Care | Payer: No Typology Code available for payment source | Attending: Emergency Medicine | Admitting: Emergency Medicine

## 2017-12-15 ENCOUNTER — Emergency Department (HOSPITAL_COMMUNITY): Payer: No Typology Code available for payment source

## 2017-12-15 ENCOUNTER — Encounter (HOSPITAL_COMMUNITY): Payer: Self-pay | Admitting: *Deleted

## 2017-12-15 ENCOUNTER — Observation Stay (HOSPITAL_COMMUNITY)
Admission: EM | Admit: 2017-12-15 | Discharge: 2017-12-16 | Disposition: A | Discharge status: Home / Self Care | Payer: No Typology Code available for payment source | Attending: Interventional Radiology | Admitting: Interventional Radiology

## 2017-12-15 DIAGNOSIS — K7030 Alcoholic cirrhosis of liver without ascites: Secondary | ICD-10-CM | POA: Diagnosis not present

## 2017-12-15 DIAGNOSIS — M109 Gout, unspecified: Secondary | ICD-10-CM | POA: Diagnosis not present

## 2017-12-15 DIAGNOSIS — T81719A Complication of unspecified artery following a procedure, not elsewhere classified, initial encounter: Secondary | ICD-10-CM | POA: Diagnosis not present

## 2017-12-15 DIAGNOSIS — I724 Aneurysm of artery of lower extremity: Principal | ICD-10-CM | POA: Diagnosis present

## 2017-12-15 DIAGNOSIS — R609 Edema, unspecified: Secondary | ICD-10-CM

## 2017-12-15 DIAGNOSIS — R0602 Shortness of breath: Secondary | ICD-10-CM | POA: Diagnosis not present

## 2017-12-15 DIAGNOSIS — I2510 Atherosclerotic heart disease of native coronary artery without angina pectoris: Secondary | ICD-10-CM | POA: Diagnosis not present

## 2017-12-15 DIAGNOSIS — D696 Thrombocytopenia, unspecified: Secondary | ICD-10-CM | POA: Diagnosis not present

## 2017-12-15 DIAGNOSIS — I700 Atherosclerosis of aorta: Secondary | ICD-10-CM | POA: Insufficient documentation

## 2017-12-15 DIAGNOSIS — I10 Essential (primary) hypertension: Secondary | ICD-10-CM | POA: Insufficient documentation

## 2017-12-15 DIAGNOSIS — T81718A Complication of other artery following a procedure, not elsewhere classified, initial encounter: Secondary | ICD-10-CM

## 2017-12-15 DIAGNOSIS — Z87891 Personal history of nicotine dependence: Secondary | ICD-10-CM | POA: Insufficient documentation

## 2017-12-15 DIAGNOSIS — T148XXA Other injury of unspecified body region, initial encounter: Secondary | ICD-10-CM

## 2017-12-15 DIAGNOSIS — M79672 Pain in left foot: Secondary | ICD-10-CM | POA: Diagnosis not present

## 2017-12-15 DIAGNOSIS — Z881 Allergy status to other antibiotic agents status: Secondary | ICD-10-CM | POA: Insufficient documentation

## 2017-12-15 DIAGNOSIS — C220 Liver cell carcinoma: Secondary | ICD-10-CM | POA: Diagnosis not present

## 2017-12-15 DIAGNOSIS — Z79899 Other long term (current) drug therapy: Secondary | ICD-10-CM | POA: Diagnosis not present

## 2017-12-15 DIAGNOSIS — M7989 Other specified soft tissue disorders: Secondary | ICD-10-CM | POA: Diagnosis not present

## 2017-12-15 DIAGNOSIS — R899 Unspecified abnormal finding in specimens from other organs, systems and tissues: Secondary | ICD-10-CM | POA: Diagnosis not present

## 2017-12-15 DIAGNOSIS — M79675 Pain in left toe(s): Secondary | ICD-10-CM | POA: Diagnosis not present

## 2017-12-15 DIAGNOSIS — K661 Hemoperitoneum: Secondary | ICD-10-CM | POA: Diagnosis not present

## 2017-12-15 LAB — COMPREHENSIVE METABOLIC PANEL
ALT: 31 U/L (ref 17–63)
AST: 45 U/L — ABNORMAL HIGH (ref 15–41)
Albumin: 3.8 g/dL (ref 3.5–5.0)
Alkaline Phosphatase: 133 U/L — ABNORMAL HIGH (ref 38–126)
Anion gap: 7 (ref 5–15)
BUN: 10 mg/dL (ref 6–20)
CHLORIDE: 103 mmol/L (ref 101–111)
CO2: 26 mmol/L (ref 22–32)
CREATININE: 0.87 mg/dL (ref 0.61–1.24)
Calcium: 8.7 mg/dL — ABNORMAL LOW (ref 8.9–10.3)
Glucose, Bld: 95 mg/dL (ref 65–99)
Potassium: 3.9 mmol/L (ref 3.5–5.1)
Sodium: 136 mmol/L (ref 135–145)
Total Bilirubin: 2.3 mg/dL — ABNORMAL HIGH (ref 0.3–1.2)
Total Protein: 6.6 g/dL (ref 6.5–8.1)

## 2017-12-15 LAB — CBC WITH DIFFERENTIAL/PLATELET
BASOS ABS: 0 10*3/uL (ref 0.0–0.1)
Basophils Relative: 0 %
EOS ABS: 0.5 10*3/uL (ref 0.0–0.7)
EOS PCT: 6 %
HEMATOCRIT: 26.7 % — AB (ref 39.0–52.0)
Hemoglobin: 9.1 g/dL — ABNORMAL LOW (ref 13.0–17.0)
Lymphocytes Relative: 29 %
Lymphs Abs: 2.6 10*3/uL (ref 0.7–4.0)
MCH: 33.3 pg (ref 26.0–34.0)
MCHC: 34.1 g/dL (ref 30.0–36.0)
MCV: 97.8 fL (ref 78.0–100.0)
MONO ABS: 2.5 10*3/uL — AB (ref 0.1–1.0)
Monocytes Relative: 28 %
NEUTROS PCT: 37 %
Neutro Abs: 3.5 10*3/uL (ref 1.7–7.7)
PLATELETS: 86 10*3/uL — AB (ref 150–400)
RBC: 2.73 MIL/uL — AB (ref 4.22–5.81)
RDW: 14.4 % (ref 11.5–15.5)
WBC: 9.1 10*3/uL (ref 4.0–10.5)

## 2017-12-15 LAB — TROPONIN I: Troponin I: <0.03 ng/mL (ref <0.03)

## 2017-12-15 MED ORDER — IOPAMIDOL (ISOVUE-300) INJECTION 61%: 100.0000 mL | Freq: Once | INTRAVENOUS | Status: DC | PRN

## 2017-12-15 MED ORDER — IOPAMIDOL (ISOVUE-370) INJECTION 76%
INTRAVENOUS | Status: AC
  Filled 2017-12-15: qty 100

## 2017-12-15 MED ORDER — TRAMADOL HCL 50 MG PO TABS
50.0000 mg | ORAL_TABLET | Freq: Four times a day (QID) | ORAL | Status: DC | PRN
  Administered 2017-12-16 (×2): 50 mg via ORAL
  Filled 2017-12-15 (×2): qty 1

## 2017-12-15 MED ORDER — IOPAMIDOL (ISOVUE-370) INJECTION 76%
100.0000 mL | Freq: Once | INTRAVENOUS | Status: AC | PRN
  Administered 2017-12-15: 100 mL via INTRAVENOUS

## 2017-12-15 MED ORDER — SODIUM CHLORIDE 0.9 % IV SOLN
INTRAVENOUS | Status: DC
  Administered 2017-12-16: 1000 mL via INTRAVENOUS
  Administered 2017-12-16: 13:00:00 via INTRAVENOUS

## 2017-12-15 NOTE — ED Notes (Signed)
Ultrasound currently at bedside.

## 2017-12-15 NOTE — ED Provider Notes (Signed)
[facility] DEPT Provider Note   CSN: [id number] Arrival date & time: 12/15/17  1451     History   Chief Complaint Chief Complaint  Patient presents with  . Abnormal Lab    D-dimer 4.54    HPI Mike Ferguson is a 77 y.o. male.  HPI Patient presents with pain and swelling in the left foot and swelling the left lower extremity.  Seen by PCP and diagnosed with gout and had a positive d-dimer.  Also has some shortness of breath and wheezing.  No history of asthma or COPD.  Does have a history of liver cancer that had recent embolization that was complicated by bleed.  Has a chronic thrombocytopenia.  No fevers.  No cough. Past Medical History:  Diagnosis Date  . Arthritis    generalized-back  . Cirrhosis ([place])   . Cirrhosis, alcoholic ([place])   . Esophageal varices ([place])   . Hearing impaired person, bilateral    hearing aids bilaterally  . Hepatocellular carcinoma ([place])   . Hypertension     Patient Active Problem List   Diagnosis Date Noted  . Pseudoaneurysm of femoral artery following procedure ([place]) 12/15/2017  . Hematoma 12/08/2017  . Alcoholic cirrhosis of liver without ascites ([place]) 12/08/2017  . Hepatocellular carcinoma ([place]) 06/25/2016    Past Surgical History:  Procedure Laterality Date  . HERNIA REPAIR     RIH  . IR ANGIOGRAM SELECTIVE EACH ADDITIONAL VESSEL  07/25/2017  . IR ANGIOGRAM SELECTIVE EACH ADDITIONAL VESSEL  07/25/2017  . IR ANGIOGRAM SELECTIVE EACH ADDITIONAL VESSEL  07/25/2017  . IR ANGIOGRAM SELECTIVE EACH ADDITIONAL VESSEL  07/25/2017  . IR ANGIOGRAM SELECTIVE EACH ADDITIONAL VESSEL  12/08/2017  . IR ANGIOGRAM SELECTIVE EACH ADDITIONAL VESSEL  12/08/2017  . IR ANGIOGRAM SELECTIVE EACH ADDITIONAL VESSEL  12/08/2017  . IR ANGIOGRAM SELECTIVE EACH ADDITIONAL VESSEL  12/08/2017  . IR ANGIOGRAM SELECTIVE EACH ADDITIONAL VESSEL  12/08/2017  . IR ANGIOGRAM VISCERAL SELECTIVE  07/25/2017  . IR ANGIOGRAM VISCERAL SELECTIVE  07/25/2017  .  IR ANGIOGRAM VISCERAL SELECTIVE  12/08/2017  . IR ANGIOGRAM VISCERAL SELECTIVE  12/08/2017  . IR EMBO ARTERIAL NOT HEMORR HEMANG INC GUIDE ROADMAPPING  12/08/2017  . IR EMBO TUMOR ORGAN ISCHEMIA INFARCT INC GUIDE ROADMAPPING  07/25/2017  . IR FLUORO RM 30-60 MIN  12/08/2017  . IR GENERIC HISTORICAL  06/17/2016   IR RADIOLOGIST EVAL & MGMT 06/17/2016 Sandi Mariscal, MD [facility] INTERV RAD  . IR GENERIC HISTORICAL  07/21/2016   IR RADIOLOGIST EVAL & MGMT 07/21/2016 Sandi Mariscal, MD [facility] INTERV RAD  . IR RADIOLOGIST EVAL & MGMT  10/26/2016  . IR RADIOLOGIST EVAL & MGMT  04/05/2017  . IR RADIOLOGIST EVAL & MGMT  01/11/2017  . IR RADIOLOGIST EVAL & MGMT  06/28/2017  . IR RADIOLOGIST EVAL & MGMT  08/18/2017  . IR RADIOLOGIST EVAL & MGMT  11/17/2017  . IR US GUIDE VASC ACCESS RIGHT  07/25/2017  . IR US GUIDE VASC ACCESS RIGHT  12/08/2017  . Left rotator cuff          Home Medications    Prior to Admission medications   Medication Sig Start Date End Date Taking? Authorizing Provider  acetaminophen (TYLENOL) 500 MG tablet Take 500 mg by mouth daily as needed for mild pain.   Yes [provider]  cholecalciferol (VITAMIN D) 1000 units tablet Take 2,000 Units by mouth daily.   Yes [provider]  mirtazapine (REMERON) 15 MG tablet Take 15  mg by mouth at bedtime.   Yes [provider]  polyethylene glycol (MIRALAX / GLYCOLAX) packet Take 17 g by mouth 2 (two) times daily. 12/10/17  Yes Sheikh, Omair Latif, DO  propranolol (INDERAL) 20 MG tablet Take 20 mg by mouth 2 (two) times daily. 05/31/16  Yes [provider]  rifaximin (XIFAXAN) 550 MG TABS tablet Take 550 mg by mouth 2 (two) times daily.   Yes [provider]  senna-docusate (SENOKOT-S) 8.6-50 MG tablet Take 1 tablet by mouth at bedtime. 12/10/17  Yes Sheikh, Omair Latif, DO  tamsulosin (FLOMAX) 0.4 MG CAPS capsule TAKE 1 capsule Once in the evening Orally 30 day(s) 05/20/17  Yes [provider]  traMADol  (ULTRAM) 50 MG tablet Take 50 mg by mouth every 6 (six) hours as needed for moderate pain or severe pain.   Yes [provider]  vitamin B-12 (CYANOCOBALAMIN) 100 MCG tablet Take 100 mcg by mouth daily.   Yes [provider]  vitamin C (ASCORBIC ACID) 500 MG tablet Take 500 mg by mouth daily.   Yes [provider]  COLCRYS 0.6 MG tablet Take 0.6 mg by mouth daily.  12/15/17   [provider]  loratadine-pseudoephedrine (CLARITIN-D 24-HOUR) 10-240 MG 24 hr tablet Take 1 tablet by mouth daily as needed for allergies.     [provider]  sodium chloride (OCEAN) 0.65 % SOLN nasal spray Place 1 spray into both nostrils as needed for congestion.    [provider]    Family History No family history on file.  Social History Social History   Tobacco Use  . Smoking status: Former Smoker    Last attempt to quit: 06/24/1986    Years since quitting: 31.5  . Smokeless tobacco: Never Used  Substance Use Topics  . Alcohol use: No    Comment: 2011 Quit- ETOH abuse  . Drug use: No     Allergies   Erythromycin   Review of Systems Review of Systems  Constitutional: Negative for appetite change.  HENT: Negative for congestion.   Respiratory: Positive for shortness of breath and wheezing.   Cardiovascular: Positive for leg swelling.  Gastrointestinal: Positive for abdominal pain.  Musculoskeletal: Negative for back pain.       Left foot pain and left lower leg swelling.  Skin: Negative for rash.  Neurological: Negative for weakness.  Hematological: Negative for adenopathy.  Psychiatric/Behavioral: Negative for behavioral problems.     Physical Exam Updated Vital Signs BP (!) 149/74 (BP Location: Left Arm)   Pulse 70   Temp 98.1 F (36.7 C) (Oral)   Resp 18   Ht 5\' 5"  (1.651 m)   Wt 64.8 kg (142 lb 13.7 oz)   SpO2 99%   BMI 23.77 kg/m   Physical Exam  Constitutional: He appears well-developed.  HENT:  Head: Atraumatic.    Eyes: Pupils are equal, round, and reactive to light.  Neck: Neck supple.  Cardiovascular: Normal rate.  Pulmonary/Chest: He has no wheezes.  Mild scattered wheezes.  Abdominal: There is tenderness.  Mild lower abdominal tenderness.  No hernia palpated.  Suprapubic and groin ecchymosis.  Musculoskeletal: He exhibits edema and tenderness.  Tenderness over plantar surface of left first MTP joint.  Joint itself is not that irritable but very tender inferiorly.  Mild erythema of the skin.  Does have edema of the left lower leg.  Neurological: He is alert.  Skin: Skin is warm. Capillary refill takes less than 2 seconds.  Psychiatric: He  has a normal mood and affect.     ED Treatments / Results  Labs (all labs ordered are listed, but only abnormal results are displayed) Labs Reviewed  COMPREHENSIVE METABOLIC PANEL - Abnormal; Notable for the following components:      Result Value   Calcium 8.7 (*)    AST 45 (*)    Alkaline Phosphatase 133 (*)    Total Bilirubin 2.3 (*)    All other components within normal limits  CBC WITH DIFFERENTIAL/PLATELET - Abnormal; Notable for the following components:   RBC 2.73 (*)    Hemoglobin 9.1 (*)    HCT 26.7 (*)    Platelets 86 (*)    Monocytes Absolute 2.5 (*)    All other components within normal limits  TROPONIN I  TYPE AND SCREEN    EKG None  Radiology Ct Angio Chest Pe W And/or Wo Contrast  Result Date: 12/15/2017 CLINICAL DATA:  Pt wife states the pt's doctor called and informed them he may have a blood clot. Pt had D-dimer of 4.54. Pt states his left leg hurts. Pt's wife noticed the pt has been wheezing intermittently since coming home from hospital 5 days ago. EXAM: CT ANGIOGRAPHY CHEST CT ABDOMEN AND PELVIS WITH CONTRAST TECHNIQUE: Multidetector CT imaging of the chest was performed using the standard protocol during bolus administration of intravenous contrast. Multiplanar CT image reconstructions and MIPs were obtained to evaluate  the vascular anatomy. Multidetector CT imaging of the abdomen and pelvis was performed using the standard protocol during bolus administration of intravenous contrast. CONTRAST:  <See Chart> ISOVUE-300 IOPAMIDOL (ISOVUE-300) INJECTION 61%, 111mL ISOVUE-370 IOPAMIDOL (ISOVUE-370) INJECTION 76% COMPARISON:  12/08/2017 and multiple prior studies including 2011 FINDINGS: CTA CHEST FINDINGS Cardiovascular: The pulmonary arteries are well opacified. There is no evidence for acute pulmonary embolus. Coronary artery calcifications are present. There is atherosclerotic calcification of the thoracic aorta not associated with aneurysm. Mediastinum/Nodes: No enlarged mediastinal, hilar, or axillary lymph nodes. Thyroid gland, trachea, and esophagus demonstrate no significant findings. Lungs/Pleura: Mild patient motion artifact. No suspicious pulmonary nodules. No pleural effusions or consolidations. Airways are patent. Musculoskeletal: No chest wall abnormality. No acute or significant osseous findings. Review of the MIP images confirms the above findings. CT ABDOMEN and PELVIS FINDINGS Hepatobiliary: Cirrhotic contour of the liver. Low-attenuation again identified in the anterior segmental RIGHT hepatic lobe, without associated hyperemia best seen on the previous exam. Gallbladder is present. Pancreas: Unremarkable. No pancreatic ductal dilatation or surrounding inflammatory changes. Spleen: UPPER normal in size.  No focal abnormality. Adrenals/Urinary Tract: No urinary tract obstruction. Urinary bladder is unremarkable. Stomach/Bowel: The stomach and small bowel loops are normal in appearance. The appendix is well seen and has a normal appearance. Colon is normal in appearance. Vascular/Lymphatic: In the RIGHT inguinal region there is a moderate hematoma following previous arterial access. Adjacent to the femoral artery, there is a contrast-enhancing bilobed circumscribed mass measuring 1.3 x 2.4 centimeters and consistent  with a pseudoaneurysm. Reproductive: No significant adenopathy. Other: Prostatic calcifications are present. Musculoskeletal: No acute osseous abnormalities. Benign sclerotic lesion within the RIGHT posterior iliac wing. Review of the MIP images confirms the above findings. IMPRESSION: 1. Technically adequate exam showing no acute pulmonary embolus. 2. Atherosclerosis of the coronary arteries an aorta. Aortic Atherosclerosis (ICD10-I70.0). 3. Cirrhosis and RIGHT hepatic lobe lesion. 4. RIGHT inguinal hematoma and interval development of 2.4 cm pseudoaneurysm adjacent to the RIGHT femoral artery. These results were called by telephone at the time of interpretation on 12/15/2017 at 7:13  pm to Dr. Davonna Belling , who verbally acknowledged these results. Electronically Signed   By: Nolon Nations M.D.   On: 12/15/2017 19:14   Ct Abdomen Pelvis W Contrast  Result Date: 12/15/2017 CLINICAL DATA:  Pt wife states the pt's doctor called and informed them he may have a blood clot. Pt had D-dimer of 4.54. Pt states his left leg hurts. Pt's wife noticed the pt has been wheezing intermittently since coming home from hospital 5 days ago. EXAM: CT ANGIOGRAPHY CHEST CT ABDOMEN AND PELVIS WITH CONTRAST TECHNIQUE: Multidetector CT imaging of the chest was performed using the standard protocol during bolus administration of intravenous contrast. Multiplanar CT image reconstructions and MIPs were obtained to evaluate the vascular anatomy. Multidetector CT imaging of the abdomen and pelvis was performed using the standard protocol during bolus administration of intravenous contrast. CONTRAST:  <See Chart> ISOVUE-300 IOPAMIDOL (ISOVUE-300) INJECTION 61%, 149mL ISOVUE-370 IOPAMIDOL (ISOVUE-370) INJECTION 76% COMPARISON:  12/08/2017 and multiple prior studies including 2011 FINDINGS: CTA CHEST FINDINGS Cardiovascular: The pulmonary arteries are well opacified. There is no evidence for acute pulmonary embolus. Coronary artery  calcifications are present. There is atherosclerotic calcification of the thoracic aorta not associated with aneurysm. Mediastinum/Nodes: No enlarged mediastinal, hilar, or axillary lymph nodes. Thyroid gland, trachea, and esophagus demonstrate no significant findings. Lungs/Pleura: Mild patient motion artifact. No suspicious pulmonary nodules. No pleural effusions or consolidations. Airways are patent. Musculoskeletal: No chest wall abnormality. No acute or significant osseous findings. Review of the MIP images confirms the above findings. CT ABDOMEN and PELVIS FINDINGS Hepatobiliary: Cirrhotic contour of the liver. Low-attenuation again identified in the anterior segmental RIGHT hepatic lobe, without associated hyperemia best seen on the previous exam. Gallbladder is present. Pancreas: Unremarkable. No pancreatic ductal dilatation or surrounding inflammatory changes. Spleen: UPPER normal in size.  No focal abnormality. Adrenals/Urinary Tract: No urinary tract obstruction. Urinary bladder is unremarkable. Stomach/Bowel: The stomach and small bowel loops are normal in appearance. The appendix is well seen and has a normal appearance. Colon is normal in appearance. Vascular/Lymphatic: In the RIGHT inguinal region there is a moderate hematoma following previous arterial access. Adjacent to the femoral artery, there is a contrast-enhancing bilobed circumscribed mass measuring 1.3 x 2.4 centimeters and consistent with a pseudoaneurysm. Reproductive: No significant adenopathy. Other: Prostatic calcifications are present. Musculoskeletal: No acute osseous abnormalities. Benign sclerotic lesion within the RIGHT posterior iliac wing. Review of the MIP images confirms the above findings. IMPRESSION: 1. Technically adequate exam showing no acute pulmonary embolus. 2. Atherosclerosis of the coronary arteries an aorta. Aortic Atherosclerosis (ICD10-I70.0). 3. Cirrhosis and RIGHT hepatic lobe lesion. 4. RIGHT inguinal hematoma  and interval development of 2.4 cm pseudoaneurysm adjacent to the RIGHT femoral artery. These results were called by telephone at the time of interpretation on 12/15/2017 at 7:13 pm to Dr. Davonna Belling , who verbally acknowledged these results. Electronically Signed   By: Nolon Nations M.D.   On: 12/15/2017 19:14   Dg Foot Complete Left  Result Date: 12/15/2017 CLINICAL DATA:  Left foot pain and swelling beginning yesterday. No known injury. EXAM: LEFT FOOT - COMPLETE 3+ VIEW COMPARISON:  1Jul 07, 202017 FINDINGS: Nonspecific soft tissue swelling in the region of the MTP joint of the great toe. No evidence of erosion. The remainder the exam is negative. Question gout. Arterial calcification incidentally noted. IMPRESSION: Nonspecific soft tissue swelling in the region of the MTP joint of the great toe at could be seen with gout. No osseous finding however. Electronically Signed   By:  Nelson Chimes M.D.   On: 12/15/2017 17:09    Procedures Procedures (including critical care time)  Medications Ordered in ED Medications  iopamidol (ISOVUE-300) 61 % injection 100 mL ( Intravenous Canceled Entry 12/15/17 1826)  iopamidol (ISOVUE-370) 76 % injection (has no administration in time range)  traMADol (ULTRAM) tablet 50 mg (has no administration in time range)  0.9 %  sodium chloride infusion (has no administration in time range)  iopamidol (ISOVUE-370) 76 % injection 100 mL (100 mLs Intravenous Contrast Given 12/15/17 1827)     Initial Impression / Assessment and Plan / ED Course  I have reviewed the triage vital signs and the nursing notes.  Pertinent labs & imaging results that were available during my care of the patient were reviewed by me and considered in my medical decision making (see chart for details).     Patient with swelling of left leg and left foot.  Question of gout for the foot.  CT scan done to evaluate for pulmonary embolism and rescan of abdomen done due to recent hematoma.  Has  pseudoaneurysm now.  Discussed with vascular surgery and interventional radiology.  Will be admitted by interventional radiology.  Final Clinical Impressions(s) / ED Diagnoses   Final diagnoses:  Pseudoaneurysm of femoral artery following procedure [facility])    ED Discharge Orders    None       Davonna Belling, MD 12/16/17 [phone]

## 2017-12-15 NOTE — H&P (Signed)
Chief Complaint: Patient was seen in consultation today for  Chief Complaint  Patient presents with  . Abnormal Lab    D-dimer 4.54   at the request of * No referring provider recorded for this case *   Supervising Physician: Marybelle Killings  Patient Status: [facility] - ED  History of Present Illness: Mike Ferguson is a 77 y.o. male who underwent arterial mapping and pancreatic artery embolization for future Y-90 therapy. Postoperatively, he developed syncope and was found to have a retroperitoneal hemorrhage from the right femoral artery puncture site. No pseudoaneurysm was noted on that CT. He was subsequently discharged in stable condition, then he developed pain today and came to the emergency room. A CT demonstrated a small 2.4 cm pseudoaneurysm with a narrow neck in the right groin. There is no increased retroperitoneal hemorrhage. He denies dizziness or weakness. Denies syncope.  Past Medical History:  Diagnosis Date  . Arthritis    generalized-back  . Cirrhosis ([place])   . Cirrhosis, alcoholic ([place])   . Esophageal varices ([place])   . Hearing impaired person, bilateral    hearing aids bilaterally  . Hepatocellular carcinoma ([place])   . Hypertension     Past Surgical History:  Procedure Laterality Date  . HERNIA REPAIR     RIH  . IR ANGIOGRAM SELECTIVE EACH ADDITIONAL VESSEL  07/25/2017  . IR ANGIOGRAM SELECTIVE EACH ADDITIONAL VESSEL  07/25/2017  . IR ANGIOGRAM SELECTIVE EACH ADDITIONAL VESSEL  07/25/2017  . IR ANGIOGRAM SELECTIVE EACH ADDITIONAL VESSEL  07/25/2017  . IR ANGIOGRAM SELECTIVE EACH ADDITIONAL VESSEL  12/08/2017  . IR ANGIOGRAM SELECTIVE EACH ADDITIONAL VESSEL  12/08/2017  . IR ANGIOGRAM SELECTIVE EACH ADDITIONAL VESSEL  12/08/2017  . IR ANGIOGRAM SELECTIVE EACH ADDITIONAL VESSEL  12/08/2017  . IR ANGIOGRAM SELECTIVE EACH ADDITIONAL VESSEL  12/08/2017  . IR ANGIOGRAM VISCERAL SELECTIVE  07/25/2017  . IR ANGIOGRAM VISCERAL SELECTIVE  07/25/2017  . IR ANGIOGRAM VISCERAL SELECTIVE   12/08/2017  . IR ANGIOGRAM VISCERAL SELECTIVE  12/08/2017  . IR EMBO ARTERIAL NOT HEMORR HEMANG INC GUIDE ROADMAPPING  12/08/2017  . IR EMBO TUMOR ORGAN ISCHEMIA INFARCT INC GUIDE ROADMAPPING  07/25/2017  . IR FLUORO RM 30-60 MIN  12/08/2017  . IR GENERIC HISTORICAL  06/17/2016   IR RADIOLOGIST EVAL & MGMT 06/17/2016 Sandi Mariscal, MD [facility] INTERV RAD  . IR GENERIC HISTORICAL  07/21/2016   IR RADIOLOGIST EVAL & MGMT 07/21/2016 Sandi Mariscal, MD [facility] INTERV RAD  . IR RADIOLOGIST EVAL & MGMT  10/26/2016  . IR RADIOLOGIST EVAL & MGMT  04/05/2017  . IR RADIOLOGIST EVAL & MGMT  01/11/2017  . IR RADIOLOGIST EVAL & MGMT  06/28/2017  . IR RADIOLOGIST EVAL & MGMT  08/18/2017  . IR RADIOLOGIST EVAL & MGMT  11/17/2017  . IR US GUIDE VASC ACCESS RIGHT  07/25/2017  . IR US GUIDE VASC ACCESS RIGHT  12/08/2017  . Left rotator cuff      Allergies: Erythromycin  Medications: Prior to Admission medications   Medication Sig Start Date End Date Taking? Authorizing Provider  acetaminophen (TYLENOL) 500 MG tablet Take 500 mg by mouth daily as needed for mild pain.   Yes [provider]  cholecalciferol (VITAMIN D) 1000 units tablet Take 2,000 Units by mouth daily.   Yes [provider]  mirtazapine (REMERON) 15 MG tablet Take 15 mg by mouth at bedtime.   Yes [provider]  polyethylene glycol (MIRALAX / GLYCOLAX) packet Take 17 g by mouth 2 (  two) times daily. 12/10/17  Yes Sheikh, Omair Latif, DO  propranolol (INDERAL) 20 MG tablet Take 20 mg by mouth 2 (two) times daily. 05/31/16  Yes [provider]  rifaximin (XIFAXAN) 550 MG TABS tablet Take 550 mg by mouth 2 (two) times daily.   Yes [provider]  senna-docusate (SENOKOT-S) 8.6-50 MG tablet Take 1 tablet by mouth at bedtime. 12/10/17  Yes Sheikh, Omair Latif, DO  tamsulosin (FLOMAX) 0.4 MG CAPS capsule TAKE 1 capsule Once in the evening Orally 30 day(s) 05/20/17  Yes [provider]  traMADol (ULTRAM) 50 MG tablet  Take 50 mg by mouth every 6 (six) hours as needed for moderate pain or severe pain.   Yes [provider]  vitamin B-12 (CYANOCOBALAMIN) 100 MCG tablet Take 100 mcg by mouth daily.   Yes [provider]  vitamin C (ASCORBIC ACID) 500 MG tablet Take 500 mg by mouth daily.   Yes [provider]  COLCRYS 0.6 MG tablet Take 0.6 mg by mouth daily.  12/15/17   [provider]  loratadine-pseudoephedrine (CLARITIN-D 24-HOUR) 10-240 MG 24 hr tablet Take 1 tablet by mouth daily as needed for allergies.     [provider]  sodium chloride (OCEAN) 0.65 % SOLN nasal spray Place 1 spray into both nostrils as needed for congestion.    [provider]     No family history on file.  Social History   Socioeconomic History  . Marital status: Married    Spouse name: Not on file  . Number of children: Not on file  . Years of education: Not on file  . Highest education level: Not on file  Occupational History  . Not on file  Social Needs  . Financial resource strain: Not on file  . Food insecurity:    Worry: Not on file    Inability: Not on file  . Transportation needs:    Medical: Not on file    Non-medical: Not on file  Tobacco Use  . Smoking status: Former Smoker    Last attempt to quit: 06/24/1986    Years since quitting: 31.4  . Smokeless tobacco: Never Used  Substance and Sexual Activity  . Alcohol use: No    Comment: 2011 Quit- ETOH abuse  . Drug use: No  . Sexual activity: Not Currently  Lifestyle  . Physical activity:    Days per week: Not on file    Minutes per session: Not on file  . Stress: Not on file  Relationships  . Social connections:    Talks on phone: Not on file    Gets together: Not on file    Attends religious service: Not on file    Active member of club or organization: Not on file    Attends meetings of clubs or organizations: Not on file    Relationship status: Not on file  Other Topics Concern  . Not on  file  Social History Narrative  . Not on file      Review of Systems: A 12 point ROS discussed and pertinent positives are indicated in the HPI above.  All other systems are negative.  Review of Systems  Vital Signs: BP (!) 150/72   Pulse 71   Temp 98.5 F (36.9 C) (Oral)   Resp 18   SpO2 100%   Physical Exam  He is in no distress. Neurologic exam is non-focal.  Examination of the right groin demonstrates a small pulsatile mass which is mildly  tender. There is overlying ecchymosis. His right foot pulses are intact distally. The right foot is warm and pink.       Imaging: Ct Head Wo Contrast  Result Date: 12/09/2017 CLINICAL DATA:  Syncopal episode yesterday. Dizzy. Hepatocellular carcinoma. EXAM: CT HEAD WITHOUT CONTRAST TECHNIQUE: Contiguous axial images were obtained from the base of the skull through the vertex without intravenous contrast. COMPARISON:  None. FINDINGS: Brain: Mild white matter changes are present bilaterally. No acute or focal cortical infarct is present. Basal ganglia are intact. Insular ribbon is normal bilaterally. The brainstem and cerebellum are normal. Ventricles are of normal size. No significant extra-axial fluid collection is present. Vascular: Atherosclerotic calcifications are present within the cavernous internal carotid arteries bilaterally. There is no hyperdense vessel. Skull: Calvarium is intact. No focal lytic or blastic lesions are present. Sinuses/Orbits: The paranasal sinuses and mastoid air cells are clear. Bilateral lens replacements are present. Globes and orbits are within normal limits. IMPRESSION: 1. No acute intracranial abnormality or focal lesion to explain the patient's syncopal episode or dizziness. 2. Mild white matter changes are within normal limits for age. 3. Atherosclerosis. Electronically Signed   By: San Morelle M.D.   On: 12/09/2017 13:26   Ct Angio Chest Pe W And/or Wo Contrast  Result Date: 12/15/2017 CLINICAL  DATA:  Pt wife states the pt's doctor called and informed them he may have a blood clot. Pt had D-dimer of 4.54. Pt states his left leg hurts. Pt's wife noticed the pt has been wheezing intermittently since coming home from hospital 5 days ago. EXAM: CT ANGIOGRAPHY CHEST CT ABDOMEN AND PELVIS WITH CONTRAST TECHNIQUE: Multidetector CT imaging of the chest was performed using the standard protocol during bolus administration of intravenous contrast. Multiplanar CT image reconstructions and MIPs were obtained to evaluate the vascular anatomy. Multidetector CT imaging of the abdomen and pelvis was performed using the standard protocol during bolus administration of intravenous contrast. CONTRAST:  <See Chart> ISOVUE-300 IOPAMIDOL (ISOVUE-300) INJECTION 61%, 150m ISOVUE-370 IOPAMIDOL (ISOVUE-370) INJECTION 76% COMPARISON:  12/08/2017 and multiple prior studies including 2011 FINDINGS: CTA CHEST FINDINGS Cardiovascular: The pulmonary arteries are well opacified. There is no evidence for acute pulmonary embolus. Coronary artery calcifications are present. There is atherosclerotic calcification of the thoracic aorta not associated with aneurysm. Mediastinum/Nodes: No enlarged mediastinal, hilar, or axillary lymph nodes. Thyroid gland, trachea, and esophagus demonstrate no significant findings. Lungs/Pleura: Mild patient motion artifact. No suspicious pulmonary nodules. No pleural effusions or consolidations. Airways are patent. Musculoskeletal: No chest wall abnormality. No acute or significant osseous findings. Review of the MIP images confirms the above findings. CT ABDOMEN and PELVIS FINDINGS Hepatobiliary: Cirrhotic contour of the liver. Low-attenuation again identified in the anterior segmental RIGHT hepatic lobe, without associated hyperemia best seen on the previous exam. Gallbladder is present. Pancreas: Unremarkable. No pancreatic ductal dilatation or surrounding inflammatory changes. Spleen: UPPER normal in size.   No focal abnormality. Adrenals/Urinary Tract: No urinary tract obstruction. Urinary bladder is unremarkable. Stomach/Bowel: The stomach and small bowel loops are normal in appearance. The appendix is well seen and has a normal appearance. Colon is normal in appearance. Vascular/Lymphatic: In the RIGHT inguinal region there is a moderate hematoma following previous arterial access. Adjacent to the femoral artery, there is a contrast-enhancing bilobed circumscribed mass measuring 1.3 x 2.4 centimeters and consistent with a pseudoaneurysm. Reproductive: No significant adenopathy. Other: Prostatic calcifications are present. Musculoskeletal: No acute osseous abnormalities. Benign sclerotic lesion within the RIGHT posterior iliac wing. Review of the MIP  images confirms the above findings. IMPRESSION: 1. Technically adequate exam showing no acute pulmonary embolus. 2. Atherosclerosis of the coronary arteries an aorta. Aortic Atherosclerosis (ICD10-I70.0). 3. Cirrhosis and RIGHT hepatic lobe lesion. 4. RIGHT inguinal hematoma and interval development of 2.4 cm pseudoaneurysm adjacent to the RIGHT femoral artery. These results were called by telephone at the time of interpretation on 12/15/2017 at 7:13 pm to Dr. Davonna Belling , who verbally acknowledged these results. Electronically Signed   By: Nolon Nations M.D.   On: 12/15/2017 19:14   Nm Liver Img Spect  Result Date: 12/08/2017 CLINICAL DATA:  Pre yttrium 90 radioembolization evaluation. EXAM: NUCLEAR MEDICINE LIVER SCAN; ULTRASOUND MISCELLANEOUS SOFT TISSUE TECHNIQUE: Abdominal images were obtained in multiple projections after intrahepatic arterial injection of radiopharmaceutical. SPECT imaging was performed. Lung shunt calculation was performed. RADIOPHARMACEUTICALS:  4.65mllicurie MAA TECHNETIUM TO 44M ALBUMIN AGGREGATED COMPARISON:  MRI 06/28/2017, angiography 12/08/2017 FINDINGS: The injected microaggregated albumin localizes within the RIGHT hepatic  lobe. Small focus of activity in the porta hepatis. No evidence of activity within the stomach, duodenum, or bowel. Calculated shunt fraction to the lungs equals 8.0%. IMPRESSION: 1. Injected MAA tracer activity localizes to the RIGHT hepatic lobe. 2. Small focus of radiotracer activity within the porta hepatis. Recommend catheter angiography evaluation at time of treatment. 3. Lung shunt fraction equals 8.0%. Findings conveyed toJOHN WATTS on 12/08/2017  at16:08. Electronically Signed   By: SSuzy BouchardM.D.   On: 12/08/2017 16:09   Ct Abdomen Pelvis W Contrast  Result Date: 12/15/2017 CLINICAL DATA:  Pt wife states the pt's doctor called and informed them he may have a blood clot. Pt had D-dimer of 4.54. Pt states his left leg hurts. Pt's wife noticed the pt has been wheezing intermittently since coming home from hospital 5 days ago. EXAM: CT ANGIOGRAPHY CHEST CT ABDOMEN AND PELVIS WITH CONTRAST TECHNIQUE: Multidetector CT imaging of the chest was performed using the standard protocol during bolus administration of intravenous contrast. Multiplanar CT image reconstructions and MIPs were obtained to evaluate the vascular anatomy. Multidetector CT imaging of the abdomen and pelvis was performed using the standard protocol during bolus administration of intravenous contrast. CONTRAST:  <See Chart> ISOVUE-300 IOPAMIDOL (ISOVUE-300) INJECTION 61%, 1053mISOVUE-370 IOPAMIDOL (ISOVUE-370) INJECTION 76% COMPARISON:  12/08/2017 and multiple prior studies including 2011 FINDINGS: CTA CHEST FINDINGS Cardiovascular: The pulmonary arteries are well opacified. There is no evidence for acute pulmonary embolus. Coronary artery calcifications are present. There is atherosclerotic calcification of the thoracic aorta not associated with aneurysm. Mediastinum/Nodes: No enlarged mediastinal, hilar, or axillary lymph nodes. Thyroid gland, trachea, and esophagus demonstrate no significant findings. Lungs/Pleura: Mild patient  motion artifact. No suspicious pulmonary nodules. No pleural effusions or consolidations. Airways are patent. Musculoskeletal: No chest wall abnormality. No acute or significant osseous findings. Review of the MIP images confirms the above findings. CT ABDOMEN and PELVIS FINDINGS Hepatobiliary: Cirrhotic contour of the liver. Low-attenuation again identified in the anterior segmental RIGHT hepatic lobe, without associated hyperemia best seen on the previous exam. Gallbladder is present. Pancreas: Unremarkable. No pancreatic ductal dilatation or surrounding inflammatory changes. Spleen: UPPER normal in size.  No focal abnormality. Adrenals/Urinary Tract: No urinary tract obstruction. Urinary bladder is unremarkable. Stomach/Bowel: The stomach and small bowel loops are normal in appearance. The appendix is well seen and has a normal appearance. Colon is normal in appearance. Vascular/Lymphatic: In the RIGHT inguinal region there is a moderate hematoma following previous arterial access. Adjacent to the femoral artery, there is a contrast-enhancing bilobed  circumscribed mass measuring 1.3 x 2.4 centimeters and consistent with a pseudoaneurysm. Reproductive: No significant adenopathy. Other: Prostatic calcifications are present. Musculoskeletal: No acute osseous abnormalities. Benign sclerotic lesion within the RIGHT posterior iliac wing. Review of the MIP images confirms the above findings. IMPRESSION: 1. Technically adequate exam showing no acute pulmonary embolus. 2. Atherosclerosis of the coronary arteries an aorta. Aortic Atherosclerosis (ICD10-I70.0). 3. Cirrhosis and RIGHT hepatic lobe lesion. 4. RIGHT inguinal hematoma and interval development of 2.4 cm pseudoaneurysm adjacent to the RIGHT femoral artery. These results were called by telephone at the time of interpretation on 12/15/2017 at 7:13 pm to Dr. Davonna Belling , who verbally acknowledged these results. Electronically Signed   By: Nolon Nations M.D.    On: 12/15/2017 19:14   Ir Angiogram Visceral Selective  Result Date: 12/08/2017 INDICATION: History of multifocal hepatocellular carcinoma. Patient presents today for Y 90 mapping procedure. Please refer to formal consultation in the epic EMR dated 11/17/2017 for additional details. EXAM: 1. ULTRASOUND GUIDANCE FOR ARTERIAL ACCESS 2. CELIAC AND SUPERIOR MESENTERIC ARTERIOGRAM (1st ORDER) 3. SELECTIVE COMMON AND PROPER HEPATIC ARTERIOGRAMS 4. SELECTIVE ARTERIOGRAM OF ACCESSORY PANCREATICODUODENAL ARTERY AND PERCUTANEOUS COIL EMBOLIZATION 5. SELECTIVE ARTERIOGRAM OF ACCESSORY RIGHT HEPATIC ARTERY AND ADMINISTRATION OF Tc65mMAA 6. SELECTIVE ARTERIOGRAM OF THE RIGHT HEPATIC ARTERY AND ADMINISTRATION OF Tc986mAA COMPARISON:  Hepatic bland embolization - 07/25/2017; abdominal MRI - 11/01/2017; CTA of the abdomen pelvis - 07/07/2017 MEDICATIONS: None RADIOPHARMACEUTICALS:  A total of 5 mCi Tc9957mA was administered, 2 mCi via an accessory right hepatic artery and 3 mCi via the right hepatic artery. CONTRAST:  75 cc Isovue 300 ANESTHESIA/SEDATION: Moderate (conscious) sedation was employed during this procedure. A total of Versed 3 mg and Fentanyl 150 mcg was administered intravenously. Moderate Sedation Time: 59 minutes. The patient's level of consciousness and vital signs were monitored continuously by radiology nursing throughout the procedure under my direct supervision. FLUOROSCOPY TIME:  14 minutes, 48 seconds (584 mGy) ACCESS: Right common femoral artery; hemostasis achieved with manual compression. COMPLICATIONS: None immediate. TECHNIQUE: Informed written consent was obtained from the patient after a discussion of the risks, benefits and alternatives to treatment. Questions regarding the procedure were encouraged and answered. A timeout was performed prior to the initiation of the procedure. The right groin was prepped and draped in the usual sterile fashion, and a sterile drape was applied covering the  operative field. Maximum barrier sterile technique with sterile gowns and gloves were used for the procedure. A timeout was performed prior to the initiation of the procedure. Local anesthesia was provided with 1% lidocaine. The right femoral head was marked fluoroscopically. Under ultrasound guidance, the right common femoral artery was accessed with a micropuncture kit after the overlying soft tissues were anesthetized with 1% lidocaine. An ultrasound image was saved for documentation purposes. The micropuncture sheath was exchanged for a 5 FrePakistanscular sheath over a Bentson wire. A closure arteriogram was performed through the side of the sheath confirming access within the right common femoral artery. Over a Bentson wire, a Mickelson catheter was advanced to the level of the thoracic aorta where it was back bled and flushed. The catheter was then utilized to select the superior mesenteric artery and a superior mesenteric arteriogram with delayed portal venous imaging was performed. The Mickelson catheter was then advanced cranially and utilized to select the celiac artery and a celiac arteriogram was performed. Utilizing a fathom 14 microwire, a regular renegade micro catheter was utilized to select common and proper  hepatic arteries and selective arteriograms were performed. The Renegade microcatheter was utilized to select an accessory pancreaticoduodenal artery noted to arise subjacent to the left hepatic artery. Selective arteriogram was performed confirming appropriate positioning and the vessel was subsequent percutaneously coil embolized with 3 overlapping 2 mm x 5 mm partial coils to near the vessel's origin. The microcatheter was retracted into the proper hepatic artery and a post embolization proper hepatic arteriogram was performed Next, the microcatheter was utilized to select the accessory right hepatic artery and accessory right hepatic arteriogram was performed. From this location, 2 mCi of  technetium 99 MAA was administered. Next the microcatheter was advanced into the right hepatic artery and a selective right hepatic arteriogram was performed. From the location proximal to the vessels bifurcation, 3 mCi of technetium 99 MAA was administered. At this point, the procedure was terminated. All wires and catheters and sheaths were removed from the patient. Hemostasis was achieved at the right groin and access site with manual compression. A dressing was placed. The patient tolerated procedure well without immediate postprocedural complication. The patient was escorted to nuclear medicine department for planar imaging. FINDINGS: Selective superior mesenteric arteriogram was again negative for accessory or replaced hepatic arterial supply. Late portal venous phase imaging demonstrates patency of the main, right and left portal veins. Celiac arteriogram re-demonstrates an accessory right hepatic artery supplying the medial aspect of the right lobe of the liver. 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 Successful split dose administration of a total of 5 mCi of technetium 91mMAA via the right hepatic and accessory right hepatic arteries. IMPRESSION: 1. Successful pre Y-90 arteriogram with percutaneous coil embolization of the pancreaticoduodenal artery. 2. Successful split dose administration of a total of 5 mCi of technetium 933mAA via the right hepatic and accessory right hepatic arteries. Awaiting results of nuclear medicine liver scan. PLAN: Pending the results of the nuclear medicine liver scan, the patient will return Y90 radioembolization the right hepatic artery. Pending the patient's recovery from this initial treatment session, the patient will return for radioembolization of the accessory right hepatic artery. Electronically Signed   By: JoSandi Mariscal.D.   On: 12/08/2017 13:45   Ir Angiogram Visceral Selective  Result Date: 12/08/2017 INDICATION: History of multifocal hepatocellular carcinoma. Patient presents today for Y 90 mapping procedure. Please refer to formal consultation in the epic EMR dated 11/17/2017 for additional details. EXAM: 1. ULTRASOUND GUIDANCE FOR ARTERIAL ACCESS 2. CELIAC AND SUPERIOR MESENTERIC ARTERIOGRAM (1st ORDER) 3. SELECTIVE COMMON AND PROPER HEPATIC ARTERIOGRAMS 4. SELECTIVE ARTERIOGRAM OF ACCESSORY PANCREATICODUODENAL ARTERY AND PERCUTANEOUS COIL EMBOLIZATION 5. SELECTIVE ARTERIOGRAM OF ACCESSORY RIGHT HEPATIC ARTERY AND ADMINISTRATION OF Tc9968mA 6. SELECTIVE ARTERIOGRAM OF THE RIGHT HEPATIC ARTERY AND ADMINISTRATION OF Tc99m36m COMPARISON:  Hepatic bland embolization - 07/25/2017; abdominal MRI - 11/01/2017; CTA of the abdomen pelvis - 07/07/2017 MEDICATIONS: None RADIOPHARMACEUTICALS:  A total of 5 mCi Tc99m 41mwas administered, 2 mCi via an accessory right hepatic artery and 3 mCi via the right hepatic artery. CONTRAST:  75 cc Isovue 300 ANESTHESIA/SEDATION: Moderate (conscious) sedation was employed during this procedure. A total of Versed 3 mg and Fentanyl 150 mcg was administered intravenously. Moderate Sedation Time: 59 minutes. The patient's level of consciousness and vital signs were monitored continuously by radiology nursing throughout the procedure under my direct supervision. FLUOROSCOPY TIME:  14  minutes, 48 seconds (584 mGy) ACCESS: Right common femoral artery; hemostasis achieved with manual compression. COMPLICATIONS: None immediate. TECHNIQUE: Informed written consent was obtained from the patient after a discussion of the risks, benefits and alternatives to treatment. Questions regarding the procedure were encouraged and answered. A timeout was performed prior to the initiation of the procedure. The right groin was prepped and draped in the usual sterile fashion, and a sterile drape was applied  covering the operative field. Maximum barrier sterile technique with sterile gowns and gloves were used for the procedure. A timeout was performed prior to the initiation of the procedure. Local anesthesia was provided with 1% lidocaine. The right femoral head was marked fluoroscopically. Under ultrasound guidance, the right common femoral artery was accessed with a micropuncture kit after the overlying soft tissues were anesthetized with 1% lidocaine. An ultrasound image was saved for documentation purposes. The micropuncture sheath was exchanged for a 5 [place] vascular sheath over a Bentson wire. A closure arteriogram was performed through the side of the sheath confirming access within the right common femoral artery. Over a Bentson wire, a Mickelson catheter was advanced to the level of the thoracic aorta where it was back bled and flushed. The catheter was then utilized to select the superior mesenteric artery and a superior mesenteric arteriogram with delayed portal venous imaging was performed. The Mickelson catheter was then advanced cranially and utilized to select the celiac artery and a celiac arteriogram was performed. Utilizing a fathom 14 microwire, a regular renegade micro catheter was utilized to select common and proper hepatic arteries and selective arteriograms were performed. The Renegade microcatheter was utilized to select an accessory pancreaticoduodenal artery noted to arise subjacent to the left hepatic artery. Selective arteriogram was performed confirming appropriate positioning and the vessel was subsequent percutaneously coil embolized with 3 overlapping 2 mm x 5 mm partial coils to near the vessel's origin. The microcatheter was retracted into the proper hepatic artery and a post embolization proper hepatic arteriogram was performed Next, the microcatheter was utilized to select the accessory right hepatic artery and accessory right hepatic arteriogram was performed. From this location,  2 mCi of technetium 99 MAA was administered. Next the microcatheter was advanced into the right hepatic artery and a selective right hepatic arteriogram was performed. From the location proximal to the vessels bifurcation, 3 mCi of technetium 99 MAA was administered. At this point, the procedure was terminated. All wires and catheters and sheaths were removed from the patient. Hemostasis was achieved at the right groin and access site with manual compression. A dressing was placed. The patient tolerated procedure well without immediate postprocedural complication. The patient was escorted to nuclear medicine department for planar imaging. FINDINGS: Selective superior mesenteric arteriogram was again negative for accessory or replaced hepatic arterial supply. Late portal venous phase imaging demonstrates patency of the main, right and left portal veins. Celiac arteriogram re-demonstrates an accessory right hepatic artery supplying the medial aspect of the right lobe of the liver. 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 Successful split dose administration of a total of 5 mCi of technetium 37mMAA via the right hepatic and accessory right hepatic arteries. IMPRESSION: 1. Successful pre Y-90 arteriogram with percutaneous coil embolization of the pancreaticoduodenal artery. 2. Successful split dose administration of a total of 5 mCi of technetium 953mAA via the right hepatic and accessory  right hepatic arteries. Awaiting results of nuclear medicine liver scan. PLAN: Pending the results of the nuclear medicine liver scan, the patient will return Y90 radioembolization the right hepatic artery. Pending the patient's recovery from this initial treatment session, the patient will return for radioembolization of the accessory right hepatic artery. Electronically Signed    By: Sandi Mariscal M.D.   On: 12/08/2017 13:45   Ir Angiogram Selective Each Additional Vessel  Result Date: 12/08/2017 INDICATION: History of multifocal hepatocellular carcinoma. Patient presents today for Y 90 mapping procedure. Please refer to formal consultation in the epic EMR dated 11/17/2017 for additional details. EXAM: 1. ULTRASOUND GUIDANCE FOR ARTERIAL ACCESS 2. CELIAC AND SUPERIOR MESENTERIC ARTERIOGRAM (1st ORDER) 3. SELECTIVE COMMON AND PROPER HEPATIC ARTERIOGRAMS 4. SELECTIVE ARTERIOGRAM OF ACCESSORY PANCREATICODUODENAL ARTERY AND PERCUTANEOUS COIL EMBOLIZATION 5. SELECTIVE ARTERIOGRAM OF ACCESSORY RIGHT HEPATIC ARTERY AND ADMINISTRATION OF Tc56mMAA 6. SELECTIVE ARTERIOGRAM OF THE RIGHT HEPATIC ARTERY AND ADMINISTRATION OF Tc956mAA COMPARISON:  Hepatic bland embolization - 07/25/2017; abdominal MRI - 11/01/2017; CTA of the abdomen pelvis - 07/07/2017 MEDICATIONS: None RADIOPHARMACEUTICALS:  A total of 5 mCi Tc9962mA was administered, 2 mCi via an accessory right hepatic artery and 3 mCi via the right hepatic artery. CONTRAST:  75 cc Isovue 300 ANESTHESIA/SEDATION: Moderate (conscious) sedation was employed during this procedure. A total of Versed 3 mg and Fentanyl 150 mcg was administered intravenously. Moderate Sedation Time: 59 minutes. The patient's level of consciousness and vital signs were monitored continuously by radiology nursing throughout the procedure under my direct supervision. FLUOROSCOPY TIME:  14 minutes, 48 seconds (584 mGy) ACCESS: Right common femoral artery; hemostasis achieved with manual compression. COMPLICATIONS: None immediate. TECHNIQUE: Informed written consent was obtained from the patient after a discussion of the risks, benefits and alternatives to treatment. Questions regarding the procedure were encouraged and answered. A timeout was performed prior to the initiation of the procedure. The right groin was prepped and draped in the usual sterile fashion, and a  sterile drape was applied covering the operative field. Maximum barrier sterile technique with sterile gowns and gloves were used for the procedure. A timeout was performed prior to the initiation of the procedure. Local anesthesia was provided with 1% lidocaine. The right femoral head was marked fluoroscopically. Under ultrasound guidance, the right common femoral artery was accessed with a micropuncture kit after the overlying soft tissues were anesthetized with 1% lidocaine. An ultrasound image was saved for documentation purposes. The micropuncture sheath was exchanged for a 5 FrePakistanscular sheath over a Bentson wire. A closure arteriogram was performed through the side of the sheath confirming access within the right common femoral artery. Over a Bentson wire, a Mickelson catheter was advanced to the level of the thoracic aorta where it was back bled and flushed. The catheter was then utilized to select the superior mesenteric artery and a superior mesenteric arteriogram with delayed portal venous imaging was performed. The Mickelson catheter was then advanced cranially and utilized to select the celiac artery and a celiac arteriogram was performed. Utilizing a fathom 14 microwire, a regular renegade micro catheter was utilized to select common and proper hepatic arteries and selective arteriograms were performed. The Renegade microcatheter was utilized to select an accessory pancreaticoduodenal artery noted to arise subjacent to the left hepatic artery. Selective arteriogram was performed confirming appropriate positioning and the vessel was subsequent percutaneously coil embolized with 3 overlapping 2 mm x 5 mm partial coils to near the vessel's origin. The microcatheter was retracted into  the proper hepatic artery and a post embolization proper hepatic arteriogram was performed Next, the microcatheter was utilized to select the accessory right hepatic artery and accessory right hepatic arteriogram was  performed. From this location, 2 mCi of technetium 99 MAA was administered. Next the microcatheter was advanced into the right hepatic artery and a selective right hepatic arteriogram was performed. From the location proximal to the vessels bifurcation, 3 mCi of technetium 99 MAA was administered. At this point, the procedure was terminated. All wires and catheters and sheaths were removed from the patient. Hemostasis was achieved at the right groin and access site with manual compression. A dressing was placed. The patient tolerated procedure well without immediate postprocedural complication. The patient was escorted to nuclear medicine department for planar imaging. FINDINGS: Selective superior mesenteric arteriogram was again negative for accessory or replaced hepatic arterial supply. Late portal venous phase imaging demonstrates patency of the main, right and left portal veins. Celiac arteriogram re-demonstrates an accessory right hepatic artery supplying the medial aspect of the right lobe of the liver. 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 Successful split dose administration of a total of 5 mCi of technetium 81mMAA via the right hepatic and accessory right hepatic arteries. IMPRESSION: 1. Successful pre Y-90 arteriogram with percutaneous coil embolization of the pancreaticoduodenal artery. 2. Successful split dose administration of a total of 5 mCi of technetium 979mAA via the right hepatic and accessory right hepatic arteries. Awaiting results of nuclear medicine liver scan. PLAN: Pending the results of the nuclear medicine liver scan, the patient will return Y90 radioembolization the right hepatic artery. Pending the patient's recovery from this initial treatment session, the patient will return for radioembolization of the accessory right hepatic  artery. Electronically Signed   By: JoSandi Mariscal.D.   On: 12/08/2017 13:45   Ir Angiogram Selective Each Additional Vessel  Result Date: 12/08/2017 INDICATION: History of multifocal hepatocellular carcinoma. Patient presents today for Y 90 mapping procedure. Please refer to formal consultation in the epic EMR dated 11/17/2017 for additional details. EXAM: 1. ULTRASOUND GUIDANCE FOR ARTERIAL ACCESS 2. CELIAC AND SUPERIOR MESENTERIC ARTERIOGRAM (1st ORDER) 3. SELECTIVE COMMON AND PROPER HEPATIC ARTERIOGRAMS 4. SELECTIVE ARTERIOGRAM OF ACCESSORY PANCREATICODUODENAL ARTERY AND PERCUTANEOUS COIL EMBOLIZATION 5. SELECTIVE ARTERIOGRAM OF ACCESSORY RIGHT HEPATIC ARTERY AND ADMINISTRATION OF Tc9971mA 6. SELECTIVE ARTERIOGRAM OF THE RIGHT HEPATIC ARTERY AND ADMINISTRATION OF Tc99m78m COMPARISON:  Hepatic bland embolization - 07/25/2017; abdominal MRI - 11/01/2017; CTA of the abdomen pelvis - 07/07/2017 MEDICATIONS: None RADIOPHARMACEUTICALS:  A total of 5 mCi Tc99m 9mwas administered, 2 mCi via an accessory right hepatic artery and 3 mCi via the right hepatic artery. CONTRAST:  75 cc Isovue 300 ANESTHESIA/SEDATION: Moderate (conscious) sedation was employed during this procedure. A total of Versed 3 mg and Fentanyl 150 mcg was administered intravenously. Moderate Sedation Time: 59 minutes. The patient's level of consciousness and vital signs were monitored continuously by radiology nursing throughout the procedure under my direct supervision. FLUOROSCOPY TIME:  14 minutes, 48 seconds (584 mGy) ACCESS: Right common femoral artery; hemostasis achieved with manual compression. COMPLICATIONS: None immediate. TECHNIQUE: Informed written consent was obtained from the patient after a discussion of the risks, benefits and alternatives to treatment. Questions regarding the procedure were encouraged and answered. A timeout was performed prior to the initiation of the procedure. The right groin  was prepped and draped in the  usual sterile fashion, and a sterile drape was applied covering the operative field. Maximum barrier sterile technique with sterile gowns and gloves were used for the procedure. A timeout was performed prior to the initiation of the procedure. Local anesthesia was provided with 1% lidocaine. The right femoral head was marked fluoroscopically. Under ultrasound guidance, the right common femoral artery was accessed with a micropuncture kit after the overlying soft tissues were anesthetized with 1% lidocaine. An ultrasound image was saved for documentation purposes. The micropuncture sheath was exchanged for a 5 [place] vascular sheath over a Bentson wire. A closure arteriogram was performed through the side of the sheath confirming access within the right common femoral artery. Over a Bentson wire, a Mickelson catheter was advanced to the level of the thoracic aorta where it was back bled and flushed. The catheter was then utilized to select the superior mesenteric artery and a superior mesenteric arteriogram with delayed portal venous imaging was performed. The Mickelson catheter was then advanced cranially and utilized to select the celiac artery and a celiac arteriogram was performed. Utilizing a fathom 14 microwire, a regular renegade micro catheter was utilized to select common and proper hepatic arteries and selective arteriograms were performed. The Renegade microcatheter was utilized to select an accessory pancreaticoduodenal artery noted to arise subjacent to the left hepatic artery. Selective arteriogram was performed confirming appropriate positioning and the vessel was subsequent percutaneously coil embolized with 3 overlapping 2 mm x 5 mm partial coils to near the vessel's origin. The microcatheter was retracted into the proper hepatic artery and a post embolization proper hepatic arteriogram was performed Next, the microcatheter was utilized to select the accessory right hepatic artery and accessory right  hepatic arteriogram was performed. From this location, 2 mCi of technetium 99 MAA was administered. Next the microcatheter was advanced into the right hepatic artery and a selective right hepatic arteriogram was performed. From the location proximal to the vessels bifurcation, 3 mCi of technetium 99 MAA was administered. At this point, the procedure was terminated. All wires and catheters and sheaths were removed from the patient. Hemostasis was achieved at the right groin and access site with manual compression. A dressing was placed. The patient tolerated procedure well without immediate postprocedural complication. The patient was escorted to nuclear medicine department for planar imaging. FINDINGS: Selective superior mesenteric arteriogram was again negative for accessory or replaced hepatic arterial supply. Late portal venous phase imaging demonstrates patency of the main, right and left portal veins. Celiac arteriogram re-demonstrates an accessory right hepatic artery supplying the medial aspect of the right lobe of the liver. 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 Successful split dose administration of a total of 5 mCi of technetium 8mMAA via the right hepatic and accessory right hepatic arteries. IMPRESSION: 1. Successful pre Y-90 arteriogram with percutaneous coil embolization of the pancreaticoduodenal artery. 2. Successful split dose administration of a total of 5 mCi of technetium 960mAA via the right hepatic and accessory right hepatic arteries. Awaiting results of nuclear medicine liver scan. PLAN: Pending the results of the nuclear medicine liver scan, the patient will return Y90 radioembolization the right hepatic artery. Pending the patient's recovery from this initial treatment session, the patient will return for radioembolization of the  accessory right hepatic artery. Electronically Signed   By: JoSandi Mariscal  M.D.   On: 12/08/2017 13:45   Ir Angiogram Selective Each Additional Vessel  Result Date: 12/08/2017 INDICATION: History of multifocal hepatocellular carcinoma. Patient presents today for Y 90 mapping procedure. Please refer to formal consultation in the epic EMR dated 11/17/2017 for additional details. EXAM: 1. ULTRASOUND GUIDANCE FOR ARTERIAL ACCESS 2. CELIAC AND SUPERIOR MESENTERIC ARTERIOGRAM (1st ORDER) 3. SELECTIVE COMMON AND PROPER HEPATIC ARTERIOGRAMS 4. SELECTIVE ARTERIOGRAM OF ACCESSORY PANCREATICODUODENAL ARTERY AND PERCUTANEOUS COIL EMBOLIZATION 5. SELECTIVE ARTERIOGRAM OF ACCESSORY RIGHT HEPATIC ARTERY AND ADMINISTRATION OF Tc51mMAA 6. SELECTIVE ARTERIOGRAM OF THE RIGHT HEPATIC ARTERY AND ADMINISTRATION OF Tc917mAA COMPARISON:  Hepatic bland embolization - 07/25/2017; abdominal MRI - 11/01/2017; CTA of the abdomen pelvis - 07/07/2017 MEDICATIONS: None RADIOPHARMACEUTICALS:  A total of 5 mCi Tc9941mA was administered, 2 mCi via an accessory right hepatic artery and 3 mCi via the right hepatic artery. CONTRAST:  75 cc Isovue 300 ANESTHESIA/SEDATION: Moderate (conscious) sedation was employed during this procedure. A total of Versed 3 mg and Fentanyl 150 mcg was administered intravenously. Moderate Sedation Time: 59 minutes. The patient's level of consciousness and vital signs were monitored continuously by radiology nursing throughout the procedure under my direct supervision. FLUOROSCOPY TIME:  14 minutes, 48 seconds (584 mGy) ACCESS: Right common femoral artery; hemostasis achieved with manual compression. COMPLICATIONS: None immediate. TECHNIQUE: Informed written consent was obtained from the patient after a discussion of the risks, benefits and alternatives to treatment. Questions regarding the procedure were encouraged and answered. A timeout was performed prior to the initiation of the procedure. The right groin was  prepped and draped in the usual sterile fashion, and a sterile drape was applied covering the operative field. Maximum barrier sterile technique with sterile gowns and gloves were used for the procedure. A timeout was performed prior to the initiation of the procedure. Local anesthesia was provided with 1% lidocaine. The right femoral head was marked fluoroscopically. Under ultrasound guidance, the right common femoral artery was accessed with a micropuncture kit after the overlying soft tissues were anesthetized with 1% lidocaine. An ultrasound image was saved for documentation purposes. The micropuncture sheath was exchanged for a 5 FrePakistanscular sheath over a Bentson wire. A closure arteriogram was performed through the side of the sheath confirming access within the right common femoral artery. Over a Bentson wire, a Mickelson catheter was advanced to the level of the thoracic aorta where it was back bled and flushed. The catheter was then utilized to select the superior mesenteric artery and a superior mesenteric arteriogram with delayed portal venous imaging was performed. The Mickelson catheter was then advanced cranially and utilized to select the celiac artery and a celiac arteriogram was performed. Utilizing a fathom 14 microwire, a regular renegade micro catheter was utilized to select common and proper hepatic arteries and selective arteriograms were performed. The Renegade microcatheter was utilized to select an accessory pancreaticoduodenal artery noted to arise subjacent to the left hepatic artery. Selective arteriogram was performed confirming appropriate positioning and the vessel was subsequent percutaneously coil embolized with 3 overlapping 2 mm x 5 mm partial coils to near the vessel's origin. The microcatheter was retracted into the proper hepatic artery and a post embolization proper hepatic arteriogram was performed Next, the microcatheter was utilized to select the accessory right hepatic  artery and accessory right hepatic arteriogram was performed. From this location, 2 mCi of technetium 99 MAA was administered. Next the microcatheter was advanced into the right hepatic artery and a selective right hepatic arteriogram  was performed. From the location proximal to the vessels bifurcation, 3 mCi of technetium 99 MAA was administered. At this point, the procedure was terminated. All wires and catheters and sheaths were removed from the patient. Hemostasis was achieved at the right groin and access site with manual compression. A dressing was placed. The patient tolerated procedure well without immediate postprocedural complication. The patient was escorted to nuclear medicine department for planar imaging. FINDINGS: Selective superior mesenteric arteriogram was again negative for accessory or replaced hepatic arterial supply. Late portal venous phase imaging demonstrates patency of the main, right and left portal veins. Celiac arteriogram re-demonstrates an accessory right hepatic artery supplying the medial aspect of the right lobe of the liver. 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 Successful split dose administration of a total of 5 mCi of technetium 53mMAA via the right hepatic and accessory right hepatic arteries. IMPRESSION: 1. Successful pre Y-90 arteriogram with percutaneous coil embolization of the pancreaticoduodenal artery. 2. Successful split dose administration of a total of 5 mCi of technetium 998mAA via the right hepatic and accessory right hepatic arteries. Awaiting results of nuclear medicine liver scan. PLAN: Pending the results of the nuclear medicine liver scan, the patient will return Y90 radioembolization the right hepatic artery. Pending the patient's recovery from this initial treatment session, the patient will return for  radioembolization of the accessory right hepatic artery. Electronically Signed   By: JoSandi Mariscal.D.   On: 12/08/2017 13:45   Ir Angiogram Selective Each Additional Vessel  Result Date: 12/08/2017 INDICATION: History of multifocal hepatocellular carcinoma. Patient presents today for Y 90 mapping procedure. Please refer to formal consultation in the epic EMR dated 11/17/2017 for additional details. EXAM: 1. ULTRASOUND GUIDANCE FOR ARTERIAL ACCESS 2. CELIAC AND SUPERIOR MESENTERIC ARTERIOGRAM (1st ORDER) 3. SELECTIVE COMMON AND PROPER HEPATIC ARTERIOGRAMS 4. SELECTIVE ARTERIOGRAM OF ACCESSORY PANCREATICODUODENAL ARTERY AND PERCUTANEOUS COIL EMBOLIZATION 5. SELECTIVE ARTERIOGRAM OF ACCESSORY RIGHT HEPATIC ARTERY AND ADMINISTRATION OF Tc9970mA 6. SELECTIVE ARTERIOGRAM OF THE RIGHT HEPATIC ARTERY AND ADMINISTRATION OF Tc99m11m COMPARISON:  Hepatic bland embolization - 07/25/2017; abdominal MRI - 11/01/2017; CTA of the abdomen pelvis - 07/07/2017 MEDICATIONS: None RADIOPHARMACEUTICALS:  A total of 5 mCi Tc99m 85mwas administered, 2 mCi via an accessory right hepatic artery and 3 mCi via the right hepatic artery. CONTRAST:  75 cc Isovue 300 ANESTHESIA/SEDATION: Moderate (conscious) sedation was employed during this procedure. A total of Versed 3 mg and Fentanyl 150 mcg was administered intravenously. Moderate Sedation Time: 59 minutes. The patient's level of consciousness and vital signs were monitored continuously by radiology nursing throughout the procedure under my direct supervision. FLUOROSCOPY TIME:  14 minutes, 48 seconds (584 mGy) ACCESS: Right common femoral artery; hemostasis achieved with manual compression. COMPLICATIONS: None immediate. TECHNIQUE: Informed written consent was obtained from the patient after a discussion of the risks, benefits and alternatives to treatment. Questions regarding the procedure were encouraged and answered. A timeout was performed prior to the initiation of the procedure.  The right groin was prepped and draped in the usual sterile fashion, and a sterile drape was applied covering the operative field. Maximum barrier sterile technique with sterile gowns and gloves were used for the procedure. A timeout was performed prior to the initiation of the procedure. Local anesthesia was provided with 1% lidocaine. The right femoral head was marked fluoroscopically. Under  ultrasound guidance, the right common femoral artery was accessed with a micropuncture kit after the overlying soft tissues were anesthetized with 1% lidocaine. An ultrasound image was saved for documentation purposes. The micropuncture sheath was exchanged for a 5 [place] vascular sheath over a Bentson wire. A closure arteriogram was performed through the side of the sheath confirming access within the right common femoral artery. Over a Bentson wire, a Mickelson catheter was advanced to the level of the thoracic aorta where it was back bled and flushed. The catheter was then utilized to select the superior mesenteric artery and a superior mesenteric arteriogram with delayed portal venous imaging was performed. The Mickelson catheter was then advanced cranially and utilized to select the celiac artery and a celiac arteriogram was performed. Utilizing a fathom 14 microwire, a regular renegade micro catheter was utilized to select common and proper hepatic arteries and selective arteriograms were performed. The Renegade microcatheter was utilized to select an accessory pancreaticoduodenal artery noted to arise subjacent to the left hepatic artery. Selective arteriogram was performed confirming appropriate positioning and the vessel was subsequent percutaneously coil embolized with 3 overlapping 2 mm x 5 mm partial coils to near the vessel's origin. The microcatheter was retracted into the proper hepatic artery and a post embolization proper hepatic arteriogram was performed Next, the microcatheter was utilized to select the  accessory right hepatic artery and accessory right hepatic arteriogram was performed. From this location, 2 mCi of technetium 99 MAA was administered. Next the microcatheter was advanced into the right hepatic artery and a selective right hepatic arteriogram was performed. From the location proximal to the vessels bifurcation, 3 mCi of technetium 99 MAA was administered. At this point, the procedure was terminated. All wires and catheters and sheaths were removed from the patient. Hemostasis was achieved at the right groin and access site with manual compression. A dressing was placed. The patient tolerated procedure well without immediate postprocedural complication. The patient was escorted to nuclear medicine department for planar imaging. FINDINGS: Selective superior mesenteric arteriogram was again negative for accessory or replaced hepatic arterial supply. Late portal venous phase imaging demonstrates patency of the main, right and left portal veins. Celiac arteriogram re-demonstrates an accessory right hepatic artery supplying the medial aspect of the right lobe of the liver. 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 Successful split dose administration of a total of 5 mCi of technetium 31mMAA via the right hepatic and accessory right hepatic arteries. IMPRESSION: 1. Successful pre Y-90 arteriogram with percutaneous coil embolization of the pancreaticoduodenal artery. 2. Successful split dose administration of a total of 5 mCi of technetium 948mAA via the right hepatic and accessory right hepatic arteries. Awaiting results of nuclear medicine liver scan. PLAN: Pending the results of the nuclear medicine liver scan, the patient will return Y90 radioembolization the right hepatic artery. Pending the patient's recovery from this initial treatment session, the  patient will return for radioembolization of the accessory right hepatic artery. Electronically Signed   By: JoSandi Mariscal.D.   On: 12/08/2017 13:45   Ir Angiogram Selective Each Additional Vessel  Result Date: 12/08/2017 INDICATION: History of multifocal hepatocellular carcinoma. Patient presents today for Y 90 mapping procedure. Please refer to formal consultation in the epic EMR dated 11/17/2017 for additional details. EXAM: 1. ULTRASOUND GUIDANCE FOR ARTERIAL ACCESS 2. CELIAC AND SUPERIOR MESENTERIC ARTERIOGRAM (1st  ORDER) 3. SELECTIVE COMMON AND PROPER HEPATIC ARTERIOGRAMS 4. SELECTIVE ARTERIOGRAM OF ACCESSORY PANCREATICODUODENAL ARTERY AND PERCUTANEOUS COIL EMBOLIZATION 5. SELECTIVE ARTERIOGRAM OF ACCESSORY RIGHT HEPATIC ARTERY AND ADMINISTRATION OF Tc13mMAA 6. SELECTIVE ARTERIOGRAM OF THE RIGHT HEPATIC ARTERY AND ADMINISTRATION OF Tc936mAA COMPARISON:  Hepatic bland embolization - 07/25/2017; abdominal MRI - 11/01/2017; CTA of the abdomen pelvis - 07/07/2017 MEDICATIONS: None RADIOPHARMACEUTICALS:  A total of 5 mCi Tc9962mA was administered, 2 mCi via an accessory right hepatic artery and 3 mCi via the right hepatic artery. CONTRAST:  75 cc Isovue 300 ANESTHESIA/SEDATION: Moderate (conscious) sedation was employed during this procedure. A total of Versed 3 mg and Fentanyl 150 mcg was administered intravenously. Moderate Sedation Time: 59 minutes. The patient's level of consciousness and vital signs were monitored continuously by radiology nursing throughout the procedure under my direct supervision. FLUOROSCOPY TIME:  14 minutes, 48 seconds (584 mGy) ACCESS: Right common femoral artery; hemostasis achieved with manual compression. COMPLICATIONS: None immediate. TECHNIQUE: Informed written consent was obtained from the patient after a discussion of the risks, benefits and alternatives to treatment. Questions regarding the procedure were encouraged and answered. A timeout was performed prior to the  initiation of the procedure. The right groin was prepped and draped in the usual sterile fashion, and a sterile drape was applied covering the operative field. Maximum barrier sterile technique with sterile gowns and gloves were used for the procedure. A timeout was performed prior to the initiation of the procedure. Local anesthesia was provided with 1% lidocaine. The right femoral head was marked fluoroscopically. Under ultrasound guidance, the right common femoral artery was accessed with a micropuncture kit after the overlying soft tissues were anesthetized with 1% lidocaine. An ultrasound image was saved for documentation purposes. The micropuncture sheath was exchanged for a 5 [place] sheath over a Bentson wire. A closure arteriogram was performed through the side of the sheath confirming access within the right common femoral artery. Over a Bentson wire, a Mickelson catheter was advanced to the level of the thoracic aorta where it was back bled and flushed. The catheter was then utilized to select the superior mesenteric artery and a superior mesenteric arteriogram with delayed portal venous imaging was performed. The Mickelson catheter was then advanced cranially and utilized to select the celiac artery and a celiac arteriogram was performed. Utilizing a fathom 14 microwire, a regular renegade micro catheter was utilized to select common and proper hepatic arteries and selective arteriograms were performed. The Renegade microcatheter was utilized to select an accessory pancreaticoduodenal artery noted to arise subjacent to the left hepatic artery. Selective arteriogram was performed confirming appropriate positioning and the vessel was subsequent percutaneously coil embolized with 3 overlapping 2 mm x 5 mm partial coils to near the vessel's origin. The microcatheter was retracted into the proper hepatic artery and a post embolization proper hepatic arteriogram was performed Next, the microcatheter  was utilized to select the accessory right hepatic artery and accessory right hepatic arteriogram was performed. From this location, 2 mCi of technetium 99 MAA was administered. Next the microcatheter was advanced into the right hepatic artery and a selective right hepatic arteriogram was performed. From the location proximal to the vessels bifurcation, 3 mCi of technetium 99 MAA was administered. At this point, the procedure was terminated. All wires and catheters and sheaths were removed from the patient. Hemostasis was achieved at the right groin and access site with manual compression. A dressing was placed. The patient tolerated procedure well without immediate  postprocedural complication. The patient was escorted to nuclear medicine department for planar imaging. FINDINGS: Selective superior mesenteric arteriogram was again negative for accessory or replaced hepatic arterial supply. Late portal venous phase imaging demonstrates patency of the main, right and left portal veins. Celiac arteriogram re-demonstrates an accessory right hepatic artery supplying the medial aspect of the right lobe of the liver. 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 Successful split dose administration of a total of 5 mCi of technetium 38mMAA via the right hepatic and accessory right hepatic arteries. IMPRESSION: 1. Successful pre Y-90 arteriogram with percutaneous coil embolization of the pancreaticoduodenal artery. 2. Successful split dose administration of a total of 5 mCi of technetium 929mAA via the right hepatic and accessory right hepatic arteries. Awaiting results of nuclear medicine liver scan. PLAN: Pending the results of the nuclear medicine liver scan, the patient will return Y90 radioembolization the right hepatic artery. Pending the patient's recovery from this  initial treatment session, the patient will return for radioembolization of the accessory right hepatic artery. Electronically Signed   By: JoSandi Mariscal.D.   On: 12/08/2017 13:45   Ir Fluoro Rm 30-60 Min  Result Date: 12/09/2017 CLINICAL DATA:  Patient underwent technically successful mapping Y 90 radioembolization earlier today however prior to discharge developed acute onset of right lower abdominal pain. Subsequent CTA demonstrated small area of persistent extravasation at the right common femoral access site. Please perform fluoroscopic assisted localization of the access site of the right common femoral artery prior to continued manual compression in hopes of achieving hemostasis. EXAM: IR FLOURO RM 0-60 MIN TECHNIQUE: Patient was placed supine on the fluoroscopy table and the approximate location of the right common femoral arterial access site was marked fluoroscopically. Next, approximately 30 minutes of additional manual compression was performed at this location. Patient reports subjective improvement in his right lower abdominal pain and has remained hemodynamically stable throughout this prolonged observation in interventional radiology department. CONTRAST:  None FLUOROSCOPY TIME:  12 seconds COMPARISON:  Mapping Y 90 radioembolization - earlier same day; CT abdomen pelvis - earlier same day FINDINGS: Patient was placed supine on the fluoroscopy table and the approximate location of the right common femoral arterial access site was marked fluoroscopically. Next, approximately 30 minutes of additional manual compression was performed at this location. Patient reports subjective improvement in his right lower abdominal pain and has remained hemodynamically stable throughout this prolonged observation in interventional radiology department. IMPRESSION: Successful fluoroscopic guided marking of the right femoral head followed by additional 30 minutes of manual compression at the right common femoral  arterial access site. Electronically Signed   By: JoSandi Mariscal.D.   On: 12/09/2017 13:48   Ir UsKoreauide Vasc Access Right  Result Date: 12/08/2017 INDICATION: History of multifocal hepatocellular carcinoma. Patient presents today for Y 90 mapping procedure. Please refer to formal consultation in the epic EMR dated 11/17/2017 for additional details. EXAM: 1. ULTRASOUND GUIDANCE FOR ARTERIAL ACCESS 2. CELIAC AND SUPERIOR MESENTERIC ARTERIOGRAM (1st ORDER) 3. SELECTIVE COMMON AND PROPER HEPATIC ARTERIOGRAMS 4. SELECTIVE ARTERIOGRAM OF ACCESSORY PANCREATICODUODENAL ARTERY AND PERCUTANEOUS COIL EMBOLIZATION 5. SELECTIVE ARTERIOGRAM OF ACCESSORY RIGHT HEPATIC ARTERY AND ADMINISTRATION OF Tc9924mA 6. SELECTIVE ARTERIOGRAM OF THE RIGHT HEPATIC ARTERY AND ADMINISTRATION OF Tc99m48m COMPARISON:  Hepatic bland embolization - 07/25/2017; abdominal MRI - 11/01/2017; CTA of the abdomen pelvis -  07/07/2017 MEDICATIONS: None RADIOPHARMACEUTICALS:  A total of 5 mCi Tc75mMAA was administered, 2 mCi via an accessory right hepatic artery and 3 mCi via the right hepatic artery. CONTRAST:  75 cc Isovue 300 ANESTHESIA/SEDATION: Moderate (conscious) sedation was employed during this procedure. A total of Versed 3 mg and Fentanyl 150 mcg was administered intravenously. Moderate Sedation Time: 59 minutes. The patient's level of consciousness and vital signs were monitored continuously by radiology nursing throughout the procedure under my direct supervision. FLUOROSCOPY TIME:  14 minutes, 48 seconds (584 mGy) ACCESS: Right common femoral artery; hemostasis achieved with manual compression. COMPLICATIONS: None immediate. TECHNIQUE: Informed written consent was obtained from the patient after a discussion of the risks, benefits and alternatives to treatment. Questions regarding the procedure were encouraged and answered. A timeout was performed prior to the initiation of the procedure. The right groin was prepped and draped in the usual  sterile fashion, and a sterile drape was applied covering the operative field. Maximum barrier sterile technique with sterile gowns and gloves were used for the procedure. A timeout was performed prior to the initiation of the procedure. Local anesthesia was provided with 1% lidocaine. The right femoral head was marked fluoroscopically. Under ultrasound guidance, the right common femoral artery was accessed with a micropuncture kit after the overlying soft tissues were anesthetized with 1% lidocaine. An ultrasound image was saved for documentation purposes. The micropuncture sheath was exchanged for a 5 [place] sheath over a Bentson wire. A closure arteriogram was performed through the side of the sheath confirming access within the right common femoral artery. Over a Bentson wire, a Mickelson catheter was advanced to the level of the thoracic aorta where it was back bled and flushed. The catheter was then utilized to select the superior mesenteric artery and a superior mesenteric arteriogram with delayed portal venous imaging was performed. The Mickelson catheter was then advanced cranially and utilized to select the celiac artery and a celiac arteriogram was performed. Utilizing a fathom 14 microwire, a regular renegade micro catheter was utilized to select common and proper hepatic arteries and selective arteriograms were performed. The Renegade microcatheter was utilized to select an accessory pancreaticoduodenal artery noted to arise subjacent to the left hepatic artery. Selective arteriogram was performed confirming appropriate positioning and the vessel was subsequent percutaneously coil embolized with 3 overlapping 2 mm x 5 mm partial coils to near the vessel's origin. The microcatheter was retracted into the proper hepatic artery and a post embolization proper hepatic arteriogram was performed Next, the microcatheter was utilized to select the accessory right hepatic artery and accessory right  hepatic arteriogram was performed. From this location, 2 mCi of technetium 99 MAA was administered. Next the microcatheter was advanced into the right hepatic artery and a selective right hepatic arteriogram was performed. From the location proximal to the vessels bifurcation, 3 mCi of technetium 99 MAA was administered. At this point, the procedure was terminated. All wires and catheters and sheaths were removed from the patient. Hemostasis was achieved at the right groin and access site with manual compression. A dressing was placed. The patient tolerated procedure well without immediate postprocedural complication. The patient was escorted to nuclear medicine department for planar imaging. FINDINGS: Selective superior mesenteric arteriogram was again negative for accessory or replaced hepatic arterial supply. Late portal venous phase imaging demonstrates patency of the main, right and left portal veins. Celiac arteriogram re-demonstrates an accessory right hepatic artery supplying the medial aspect of the right lobe of the liver.  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 Successful split dose administration of a total of 5 mCi of technetium 9mMAA via the right hepatic and accessory right hepatic arteries. IMPRESSION: 1. Successful pre Y-90 arteriogram with percutaneous coil embolization of the pancreaticoduodenal artery. 2. Successful split dose administration of a total of 5 mCi of technetium 928mAA via the right hepatic and accessory right hepatic arteries. Awaiting results of nuclear medicine liver scan. PLAN: Pending the results of the nuclear medicine liver scan, the patient will return Y90 radioembolization the right hepatic artery. Pending the patient's recovery from this initial treatment session, the patient will return for radioembolization of the  accessory right hepatic artery. Electronically Signed   By: JoSandi Mariscal.D.   On: 12/08/2017 13:45   Dg Foot Complete Left  Result Date: 12/15/2017 CLINICAL DATA:  Left foot pain and swelling beginning yesterday. No known injury. EXAM: LEFT FOOT - COMPLETE 3+ VIEW COMPARISON:  12020/12/315 FINDINGS: Nonspecific soft tissue swelling in the region of the MTP joint of the great toe. No evidence of erosion. The remainder the exam is negative. Question gout. Arterial calcification incidentally noted. IMPRESSION: Nonspecific soft tissue swelling in the region of the MTP joint of the great toe at could be seen with gout. No osseous finding however. Electronically Signed   By: MaNelson Chimes.D.   On: 12/15/2017 17:09   Ir Embo Arterial Not HeGibsonuide Roadmapping  Result Date: 12/08/2017 INDICATION: History of multifocal hepatocellular carcinoma. Patient presents today for Y 90 mapping procedure. Please refer to formal consultation in the epic EMR dated 11/17/2017 for additional details. EXAM: 1. ULTRASOUND GUIDANCE FOR ARTERIAL ACCESS 2. CELIAC AND SUPERIOR MESENTERIC ARTERIOGRAM (1st ORDER) 3. SELECTIVE COMMON AND PROPER HEPATIC ARTERIOGRAMS 4. SELECTIVE ARTERIOGRAM OF ACCESSORY PANCREATICODUODENAL ARTERY AND PERCUTANEOUS COIL EMBOLIZATION 5. SELECTIVE ARTERIOGRAM OF ACCESSORY RIGHT HEPATIC ARTERY AND ADMINISTRATION OF Tc9977mA 6. SELECTIVE ARTERIOGRAM OF THE RIGHT HEPATIC ARTERY AND ADMINISTRATION OF Tc99m5m COMPARISON:  Hepatic bland embolization - 07/25/2017; abdominal MRI - 11/01/2017; CTA of the abdomen pelvis - 07/07/2017 MEDICATIONS: None RADIOPHARMACEUTICALS:  A total of 5 mCi Tc99m 41mwas administered, 2 mCi via an accessory right hepatic artery and 3 mCi via the right hepatic artery. CONTRAST:  75 cc Isovue 300 ANESTHESIA/SEDATION: Moderate (conscious) sedation was employed during this procedure. A total of Versed 3 mg and Fentanyl 150 mcg was administered intravenously. Moderate Sedation  Time: 59 minutes. The patient's level of consciousness and vital signs were monitored continuously by radiology nursing throughout the procedure under my direct supervision. FLUOROSCOPY TIME:  14 minutes, 48 seconds (584 mGy) ACCESS: Right common femoral artery; hemostasis achieved with manual compression. COMPLICATIONS: None immediate. TECHNIQUE: Informed written consent was obtained from the patient after a discussion of the risks, benefits and alternatives to treatment. Questions regarding the procedure were encouraged and answered. A timeout was performed prior to the initiation of the procedure. The right groin was prepped and draped in the usual sterile fashion, and a sterile drape was applied covering the operative field. Maximum barrier sterile technique with sterile gowns and gloves were used for the procedure. A timeout was performed prior to the initiation of the procedure. Local anesthesia was provided with 1% lidocaine. The right femoral head was marked fluoroscopically. Under ultrasound guidance, the right common femoral artery was accessed with a micropuncture kit after  the overlying soft tissues were anesthetized with 1% lidocaine. An ultrasound image was saved for documentation purposes. The micropuncture sheath was exchanged for a 5 [place] vascular sheath over a Bentson wire. A closure arteriogram was performed through the side of the sheath confirming access within the right common femoral artery. Over a Bentson wire, a Mickelson catheter was advanced to the level of the thoracic aorta where it was back bled and flushed. The catheter was then utilized to select the superior mesenteric artery and a superior mesenteric arteriogram with delayed portal venous imaging was performed. The Mickelson catheter was then advanced cranially and utilized to select the celiac artery and a celiac arteriogram was performed. Utilizing a fathom 14 microwire, a regular renegade micro catheter was utilized to select  common and proper hepatic arteries and selective arteriograms were performed. The Renegade microcatheter was utilized to select an accessory pancreaticoduodenal artery noted to arise subjacent to the left hepatic artery. Selective arteriogram was performed confirming appropriate positioning and the vessel was subsequent percutaneously coil embolized with 3 overlapping 2 mm x 5 mm partial coils to near the vessel's origin. The microcatheter was retracted into the proper hepatic artery and a post embolization proper hepatic arteriogram was performed Next, the microcatheter was utilized to select the accessory right hepatic artery and accessory right hepatic arteriogram was performed. From this location, 2 mCi of technetium 99 MAA was administered. Next the microcatheter was advanced into the right hepatic artery and a selective right hepatic arteriogram was performed. From the location proximal to the vessels bifurcation, 3 mCi of technetium 99 MAA was administered. At this point, the procedure was terminated. All wires and catheters and sheaths were removed from the patient. Hemostasis was achieved at the right groin and access site with manual compression. A dressing was placed. The patient tolerated procedure well without immediate postprocedural complication. The patient was escorted to nuclear medicine department for planar imaging. FINDINGS: Selective superior mesenteric arteriogram was again negative for accessory or replaced hepatic arterial supply. Late portal venous phase imaging demonstrates patency of the main, right and left portal veins. Celiac arteriogram re-demonstrates an accessory right hepatic artery supplying the medial aspect of the right lobe of the liver. 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 Successful split dose administration of a total  of 5 mCi of technetium 54mMAA via the right hepatic and accessory right hepatic arteries. IMPRESSION: 1. Successful pre Y-90 arteriogram with percutaneous coil embolization of the pancreaticoduodenal artery. 2. Successful split dose administration of a total of 5 mCi of technetium 930mAA via the right hepatic and accessory right hepatic arteries. Awaiting results of nuclear medicine liver scan. PLAN: Pending the results of the nuclear medicine liver scan, the patient will return Y90 radioembolization the right hepatic artery. Pending the patient's recovery from this initial treatment session, the patient will return for radioembolization of the accessory right hepatic artery. Electronically Signed   By: JoSandi Mariscal.D.   On: 12/08/2017 13:45   Ir Radiologist Eval & Mgmt  Result Date: 11/17/2017 Please refer to notes tab for details about interventional procedure. (Op Note)  Ct Angio Abd/pel W/ And/or W/o  Result Date: 12/08/2017 CLINICAL DATA:  Mapping Y 90 radioembolization earlier today, now with right lower abdominal pain. Evaluate for hematoma/access site complication. EXAM: CTA ABDOMEN AND PELVIS WITH CONTRAST TECHNIQUE: Multidetector CT imaging of the abdomen  and pelvis was performed using the standard protocol during bolus administration of intravenous contrast. Multiplanar reconstructed images and MIPs were obtained and reviewed to evaluate the vascular anatomy. CONTRAST:  24m ISOVUE-370 IOPAMIDOL (ISOVUE-370) INJECTION 76% COMPARISON:  Abdominal MRI-11/01/2017; CT abdomen pelvis - 07/07/2017 FINDINGS: VASCULAR Aorta: Scattered mixed calcified and noncalcified atherosclerotic plaque with a normal caliber abdominal aorta, not resulting in hemodynamically significant stenosis. Celiac: There is a minimal amount of atherosclerotic plaque involving the origin the celiac artery, not resulting in hemodynamically significant stenosis. Sequela of coil embolization the pancreaticoduodenal artery. SMA: There  is a moderate amount of mixed calcified and noncalcified atherosclerotic plaque involving the origin of the SMA which approaches of approximately 50% luminal narrowing. Renals: Solitary bilaterally. There is a minimal amount of calcified atherosclerotic plaque involving the origin of left renal artery, not resulting in a hemodynamically significant stenosis. No vessel irregularity to suggest FMD. IMA: Remains widely patent. Pelvic inflow: There is a minimal amount of calcified atherosclerotic plaque involving the bilateral common iliac arteries, not resulting in a hemodynamically significant stenosis. The bilateral common iliac arteries are diseased though of normal caliber. The bilateral external iliac arteries are of normal caliber and widely patent. Right-sided proximal outflow: There is a minimal amount of mixed calcified and noncalcified atherosclerotic plaque within the right common femoral artery. There is a tiny area of active extravasation at the location of the right common femoral artery access site (representative images 23 through 25) with associated hematoma tracking into the right lower pelvis and abdomen. No definitive retroperitoneal hematoma. The adjacent inferior epigastric artery appears widely patent as does the right deep iliac circumflex artery. No vessel dissection. The imaged portions of the right superficial and deep femoral artery appear widely patent. Left-sided proximal outflow: The left common femoral artery is widely patent without hemodynamically significant stenosis. There is a minimal amount of calcified atherosclerotic plaque involving the origin of the left superficial femoral artery, not resulting in hemodynamically significant stenosis. The left deep femoral artery is widely patent throughout its imaged course. Veins: The pelvic venous system and IVC appear widely patent. Review of the MIP images confirms the above findings. NON-VASCULAR Lower chest: Limited visualization of the  lower thorax is negative for focal airspace opacity or pleural effusion. Coronary artery calcifications.  No pericardial effusion. 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 Re demonstrated ill-defined area of hyperenhancement adjacent to the ablation zone within the anterior segment of the right lobe of the liver (image 23, series 2 as well as nodular areas of enhancement within the right lobe of the liver (images 21, 24 and 34) compatible with known multifocal hepatocellular carcinoma. The portal vein appears widely patent. Normal appearance of the gallbladder.  No radiopaque gallstones. Pancreas: Normal appearance of the pancreas Spleen: Normal appearance of the spleen Adrenals/Urinary Tract: Excreted contrast from a prior arteriogram seen within bilateral renal collecting system. No definite renal stones. No discrete renal lesions. No urinary obstruction or perinephric stranding. Excreted contrast seen within the urinary bladder. Stomach/Bowel: There is minimal mass effect upon the cecum a distal sigmoid colon secondary to the right lower abdominal hematoma. The bowel is otherwise normal in course and caliber without wall thickening or evidence of enteric obstruction. No pneumoperitoneum, pneumatosis or portal venous gas. Lymphatic: No bulky porta hepatis, retroperitoneal, mesenteric, pelvic or inguinal lymphadenopathy. Reproductive: Dystrophic calcifications within normal sized prostate gland. No free fluid in the pelvic cul-de-sac. Other: Minimal amount of expected stranding within the right groin regional to the  access site. Musculoskeletal: No acute or aggressive osseous abnormalities. Stigmata of DISH within the lower thoracic spine. Mild-to-moderate multilevel lumbar spine DDD, worse at a definitive 3 and L3-L4 with disc space height loss, endplate irregularity and sclerosis. Bilateral facet degenerative change within the lower lumbar spine. Bone island is noted  involving the posterior aspect the right ilium, unchanged. IMPRESSION: VASCULAR 1. Tiny area of active extravasation from the right common femoral artery access site with associated hematoma extending from the right pelvis to the right lower abdomen. Patient was subsequently brought to the interventional radiology suite and approximately 30 additional minutes of manual compression was applied. 2.  Aortic Atherosclerosis (ICD10-I70.0). NON-VASCULAR 1. Otherwise, no acute findings within the abdomen or pelvis. 2. Similar findings of hepatic cirrhosis and multifocal hepatocellular carcinoma as seen on recent abdominal MRI. Electronically Signed   By: Sandi Mariscal M.D.   On: 12/08/2017 16:53   Nm Fusion  Result Date: 12/08/2017 CLINICAL DATA:  Pre yttrium 90 radioembolization evaluation. EXAM: NUCLEAR MEDICINE LIVER SCAN; ULTRASOUND MISCELLANEOUS SOFT TISSUE TECHNIQUE: Abdominal images were obtained in multiple projections after intrahepatic arterial injection of radiopharmaceutical. SPECT imaging was performed. Lung shunt calculation was performed. RADIOPHARMACEUTICALS:  4.74mllicurie MAA TECHNETIUM TO 56M ALBUMIN AGGREGATED COMPARISON:  MRI 06/28/2017, angiography 12/08/2017 FINDINGS: The injected microaggregated albumin localizes within the RIGHT hepatic lobe. Small focus of activity in the porta hepatis. No evidence of activity within the stomach, duodenum, or bowel. Calculated shunt fraction to the lungs equals 8.0%. IMPRESSION: 1. Injected MAA tracer activity localizes to the RIGHT hepatic lobe. 2. Small focus of radiotracer activity within the porta hepatis. Recommend catheter angiography evaluation at time of treatment. 3. Lung shunt fraction equals 8.0%. Findings conveyed toJOHN WATTS on 12/08/2017  at16:08. Electronically Signed   By: SSuzy BouchardM.D.   On: 12/08/2017 16:09    Labs:  CBC: Recent Labs    12/09/17 1948 12/10/17 0249 12/10/17 0910 12/15/17 1631  WBC 6.1 6.4 6.7 9.1  HGB 9.6*  9.0* 9.0* 9.1*  HCT 28.4* 26.3* 26.1* 26.7*  PLT 59* 53* 47* 86*    COAGS: Recent Labs    07/25/17 1146 12/08/17 0803  INR 1.14 1.12    BMP: Recent Labs    12/08/17 0803 12/09/17 0520 12/10/17 0249 12/15/17 1631  NA 138 139 140 136  K 5.1 4.3 4.1 3.9  CL 100* 104 109 103  CO2 25 21* 23 26  GLUCOSE 83 95 110* 95  BUN _0 CALCIUM 9.5 9.0 8.5* 8.7*  CREATININE 1.07 0.88 0.94 0.87  GFRNONAA >60 >60 >60 >60  GFRAA >60 >60 >60 >60    LIVER FUNCTION TESTS: Recent Labs    12/08/17 0803 12/09/17 0520 12/10/17 0249 12/15/17 1631  BILITOT 1.6* 1.8* 1.5* 2.3*  AST 75* 43* 40 45*  ALT 45 _1 ALKPHOS 124 91 83 133*  PROT 8.5* 6.6 6.1* 6.6  ALBUMIN 5.1* 4.0 3.6 3.8    TUMOR MARKERS: Recent Labs    12/16/16 1015  AFPTM 17.0*    Assessment and Plan:  Mr. CWeatherbeehas unfortunately developed a small pseudoaneurysm and the right femoral artery entry site. This was not present a few days ago and is therefor relatively fresh. There is also no change in the retroperitoneal hemorrhage. His Hb is stable and platelets are 80k. The pseudoaneurysm should be stable while under bedrest overnight. I have spoken with vascular surgery and he will undergo [place] compression or thrombin injection tomorrow morning.  Thank you for this interesting consult.  I greatly enjoyed meeting Mike Ferguson and look forward to participating in their care.  A copy of this report was sent to the requesting provider on this date.  Electronically Signed: Lamarion Mcevers, ART A, MD 12/15/2017, 8:49 PM   I spent a total of 40 Minutes  in face to face in clinical consultation, greater than 50% of which was counseling/coordinating care for pseudoaneurysm.

## 2017-12-15 NOTE — Progress Notes (Signed)
Left lower extremity venous duplex has been completed. Negative for DVT. Results were given to Dr. Alvino Chapel.   12/15/17 4:27 PM Carlos Levering RVT

## 2017-12-15 NOTE — ED Triage Notes (Signed)
Provider faxed paperwork w/ labs. Note states the pt has had left foot swelling since yesterday, red and painful since this morning.

## 2017-12-15 NOTE — ED Notes (Signed)
ED TO INPATIENT HANDOFF REPORT  Name/Age/Gender Mike Ferguson 77 y.o. male  Code Status Code Status History    Date Active Date Inactive Code Status Order ID Comments User Context   12/08/2017 1709 12/10/2017 2013 Full Code [id number]  Kerney Elbe, DO Inpatient      Home/SNF/Other Home  Chief Complaint Cancer Pt; Possible Blood Clot  Level of Care/Admitting Diagnosis ED Disposition    ED Disposition Condition Comment   Admit  Hospital Area: [facility] [[id number]]  Level of Care: Med-Surg [16]  Diagnosis: Pseudoaneurysm of femoral artery following procedure [facility]) [[id number]]  Admitting Physician: Marybelle Killings [phone]  Attending Physician: Marybelle Killings [[id number]]  PT Class (Do Not Modify): Observation [104]  PT Acc Code (Do Not Modify): Observation [10022]       Medical History Past Medical History:  Diagnosis Date  . Arthritis    generalized-back  . Cirrhosis ([place])   . Cirrhosis, alcoholic ([place])   . Esophageal varices ([place])   . Hearing impaired person, bilateral    hearing aids bilaterally  . Hepatocellular carcinoma ([place])   . Hypertension     Allergies Allergies  Allergen Reactions  . Erythromycin Other (See Comments)    Abd cramps    IV Location/Drains/Wounds Patient Lines/Drains/Airways Status   Active Line/Drains/Airways    Name:   Placement date:   Placement time:   Site:   Days:   Peripheral IV 12/15/17 Right Antecubital   12/15/17    1748    Antecubital   less than 1          Labs/Imaging Results for orders placed or performed during the hospital encounter of 12/15/17 (from the past 48 hour(s))  Comprehensive metabolic panel     Status: Abnormal   Collection Time: 12/15/17  4:31 PM  Result Value Ref Range   Sodium 136 135 - 145 mmol/L   Potassium 3.9 3.5 - 5.1 mmol/L   Chloride 103 101 - 111 mmol/L   CO2 26 22 - 32 mmol/L   Glucose, Bld 95 65 - 99 mg/dL   BUN 10 6 - 20 mg/dL   Creatinine, Ser 0.87 0.61 - 1.24 mg/dL    Calcium 8.7 (L) 8.9 - 10.3 mg/dL   Total Protein 6.6 6.5 - 8.1 g/dL   Albumin 3.8 3.5 - 5.0 g/dL   AST 45 (H) 15 - 41 U/L   ALT 31 17 - 63 U/L   Alkaline Phosphatase 133 (H) 38 - 126 U/L   Total Bilirubin 2.3 (H) 0.3 - 1.2 mg/dL   GFR calc non Af Amer >60 >60 mL/min   GFR calc Af Amer >60 >60 mL/min    Comment: (NOTE) The eGFR has been calculated using the CKD EPI equation. This calculation has not been validated in all clinical situations. eGFR's persistently <60 mL/min signify possible Chronic Kidney Disease.    Anion gap 7 5 - 15    Comment: Performed at [facility], [place] [address] [place], [place] [address]  CBC with Differential     Status: Abnormal   Collection Time: 12/15/17  4:31 PM  Result Value Ref Range   WBC 9.1 4.0 - 10.5 K/uL   RBC 2.73 (L) 4.22 - 5.81 MIL/uL   Hemoglobin 9.1 (L) 13.0 - 17.0 g/dL   HCT 26.7 (L) 39.0 - 52.0 %   MCV 97.8 78.0 - 100.0 fL   MCH 33.3 26.0 - 34.0 pg   MCHC 34.1 30.0 - 36.0 g/dL  RDW 14.4 11.5 - 15.5 %   Platelets 86 (L) 150 - 400 K/uL    Comment: REPEATED TO VERIFY SPECIMEN CHECKED FOR CLOTS PLATELET COUNT CONFIRMED BY SMEAR    Neutrophils Relative % 37 %   Lymphocytes Relative 29 %   Monocytes Relative 28 %   Eosinophils Relative 6 %   Basophils Relative 0 %   Neutro Abs 3.5 1.7 - 7.7 K/uL   Lymphs Abs 2.6 0.7 - 4.0 K/uL   Monocytes Absolute 2.5 (H) 0.1 - 1.0 K/uL   Eosinophils Absolute 0.5 0.0 - 0.7 K/uL   Basophils Absolute 0.0 0.0 - 0.1 K/uL   WBC Morphology MILD LEFT SHIFT (1-5% METAS, OCC MYELO, OCC BANDS)    Smear Review PLATELET COUNT CONFIRMED BY SMEAR     Comment: Performed at [facility], [place] [address], [place], [place] [address]  Troponin I     Status: None   Collection Time: 12/15/17  4:31 PM  Result Value Ref Range   Troponin I <0.03 <0.03 ng/mL    Comment: Performed at [facility], [place] [address] [place], [place] [address]   Ct Angio Chest Pe W  And/or Wo Contrast  Result Date: 12/15/2017 CLINICAL DATA:  Pt wife states the pt's doctor called and informed them he may have a blood clot. Pt had D-dimer of 4.54. Pt states his left leg hurts. Pt's wife noticed the pt has been wheezing intermittently since coming home from hospital 5 days ago. EXAM: CT ANGIOGRAPHY CHEST CT ABDOMEN AND PELVIS WITH CONTRAST TECHNIQUE: Multidetector CT imaging of the chest was performed using the standard protocol during bolus administration of intravenous contrast. Multiplanar CT image reconstructions and MIPs were obtained to evaluate the vascular anatomy. Multidetector CT imaging of the abdomen and pelvis was performed using the standard protocol during bolus administration of intravenous contrast. CONTRAST:  <See Chart> ISOVUE-300 IOPAMIDOL (ISOVUE-300) INJECTION 61%, 173m ISOVUE-370 IOPAMIDOL (ISOVUE-370) INJECTION 76% COMPARISON:  12/08/2017 and multiple prior studies including 2011 FINDINGS: CTA CHEST FINDINGS Cardiovascular: The pulmonary arteries are well opacified. There is no evidence for acute pulmonary embolus. Coronary artery calcifications are present. There is atherosclerotic calcification of the thoracic aorta not associated with aneurysm. Mediastinum/Nodes: No enlarged mediastinal, hilar, or axillary lymph nodes. Thyroid gland, trachea, and esophagus demonstrate no significant findings. Lungs/Pleura: Mild patient motion artifact. No suspicious pulmonary nodules. No pleural effusions or consolidations. Airways are patent. Musculoskeletal: No chest wall abnormality. No acute or significant osseous findings. Review of the MIP images confirms the above findings. CT ABDOMEN and PELVIS FINDINGS Hepatobiliary: Cirrhotic contour of the liver. Low-attenuation again identified in the anterior segmental RIGHT hepatic lobe, without associated hyperemia best seen on the previous exam. Gallbladder is present. Pancreas: Unremarkable. No pancreatic ductal dilatation or  surrounding inflammatory changes. Spleen: UPPER normal in size.  No focal abnormality. Adrenals/Urinary Tract: No urinary tract obstruction. Urinary bladder is unremarkable. Stomach/Bowel: The stomach and small bowel loops are normal in appearance. The appendix is well seen and has a normal appearance. Colon is normal in appearance. Vascular/Lymphatic: In the RIGHT inguinal region there is a moderate hematoma following previous arterial access. Adjacent to the femoral artery, there is a contrast-enhancing bilobed circumscribed mass measuring 1.3 x 2.4 centimeters and consistent with a pseudoaneurysm. Reproductive: No significant adenopathy. Other: Prostatic calcifications are present. Musculoskeletal: No acute osseous abnormalities. Benign sclerotic lesion within the RIGHT posterior iliac wing. Review of the MIP images confirms the above findings. IMPRESSION: 1. Technically adequate exam showing no  acute pulmonary embolus. 2. Atherosclerosis of the coronary arteries an aorta. Aortic Atherosclerosis (ICD10-I70.0). 3. Cirrhosis and RIGHT hepatic lobe lesion. 4. RIGHT inguinal hematoma and interval development of 2.4 cm pseudoaneurysm adjacent to the RIGHT femoral artery. These results were called by telephone at the time of interpretation on 12/15/2017 at 7:13 pm to Dr. Davonna Belling , who verbally acknowledged these results. Electronically Signed   By: Nolon Nations M.D.   On: 12/15/2017 19:14   Ct Abdomen Pelvis W Contrast  Result Date: 12/15/2017 CLINICAL DATA:  Pt wife states the pt's doctor called and informed them he may have a blood clot. Pt had D-dimer of 4.54. Pt states his left leg hurts. Pt's wife noticed the pt has been wheezing intermittently since coming home from hospital 5 days ago. EXAM: CT ANGIOGRAPHY CHEST CT ABDOMEN AND PELVIS WITH CONTRAST TECHNIQUE: Multidetector CT imaging of the chest was performed using the standard protocol during bolus administration of intravenous contrast.  Multiplanar CT image reconstructions and MIPs were obtained to evaluate the vascular anatomy. Multidetector CT imaging of the abdomen and pelvis was performed using the standard protocol during bolus administration of intravenous contrast. CONTRAST:  <See Chart> ISOVUE-300 IOPAMIDOL (ISOVUE-300) INJECTION 61%, 119m ISOVUE-370 IOPAMIDOL (ISOVUE-370) INJECTION 76% COMPARISON:  12/08/2017 and multiple prior studies including 2011 FINDINGS: CTA CHEST FINDINGS Cardiovascular: The pulmonary arteries are well opacified. There is no evidence for acute pulmonary embolus. Coronary artery calcifications are present. There is atherosclerotic calcification of the thoracic aorta not associated with aneurysm. Mediastinum/Nodes: No enlarged mediastinal, hilar, or axillary lymph nodes. Thyroid gland, trachea, and esophagus demonstrate no significant findings. Lungs/Pleura: Mild patient motion artifact. No suspicious pulmonary nodules. No pleural effusions or consolidations. Airways are patent. Musculoskeletal: No chest wall abnormality. No acute or significant osseous findings. Review of the MIP images confirms the above findings. CT ABDOMEN and PELVIS FINDINGS Hepatobiliary: Cirrhotic contour of the liver. Low-attenuation again identified in the anterior segmental RIGHT hepatic lobe, without associated hyperemia best seen on the previous exam. Gallbladder is present. Pancreas: Unremarkable. No pancreatic ductal dilatation or surrounding inflammatory changes. Spleen: UPPER normal in size.  No focal abnormality. Adrenals/Urinary Tract: No urinary tract obstruction. Urinary bladder is unremarkable. Stomach/Bowel: The stomach and small bowel loops are normal in appearance. The appendix is well seen and has a normal appearance. Colon is normal in appearance. Vascular/Lymphatic: In the RIGHT inguinal region there is a moderate hematoma following previous arterial access. Adjacent to the femoral artery, there is a contrast-enhancing  bilobed circumscribed mass measuring 1.3 x 2.4 centimeters and consistent with a pseudoaneurysm. Reproductive: No significant adenopathy. Other: Prostatic calcifications are present. Musculoskeletal: No acute osseous abnormalities. Benign sclerotic lesion within the RIGHT posterior iliac wing. Review of the MIP images confirms the above findings. IMPRESSION: 1. Technically adequate exam showing no acute pulmonary embolus. 2. Atherosclerosis of the coronary arteries an aorta. Aortic Atherosclerosis (ICD10-I70.0). 3. Cirrhosis and RIGHT hepatic lobe lesion. 4. RIGHT inguinal hematoma and interval development of 2.4 cm pseudoaneurysm adjacent to the RIGHT femoral artery. These results were called by telephone at the time of interpretation on 12/15/2017 at 7:13 pm to Dr. NDavonna Belling, who verbally acknowledged these results. Electronically Signed   By: ENolon NationsM.D.   On: 12/15/2017 19:14   Dg Foot Complete Left  Result Date: 12/15/2017 CLINICAL DATA:  Left foot pain and swelling beginning yesterday. No known injury. EXAM: LEFT FOOT - COMPLETE 3+ VIEW COMPARISON:  122-Jul-202017 FINDINGS: Nonspecific soft tissue swelling in the region of the  MTP joint of the great toe. No evidence of erosion. The remainder the exam is negative. Question gout. Arterial calcification incidentally noted. IMPRESSION: Nonspecific soft tissue swelling in the region of the MTP joint of the great toe at could be seen with gout. No osseous finding however. Electronically Signed   By: Nelson Chimes M.D.   On: 12/15/2017 17:09    Pending Labs Unresulted Labs (From admission, onward)   Start     Ordered   12/15/17 2105  Type and screen [facility]  Once,   R    Comments:  [place]    12/15/17 2104      Vitals/Pain Today's Vitals   12/15/17 1631 12/15/17 1752 12/15/17 1904 12/15/17 1930  BP: (!) 148/62 (!) 149/66 (!) 150/72   Pulse: 67 65 71   Resp: '16 20 18   ' Temp:       TempSrc:      SpO2: 100% 100% 100%   PainSc:    0-No pain    Isolation Precautions No active isolations  Medications Medications  iopamidol (ISOVUE-300) 61 % injection 100 mL ( Intravenous Canceled Entry 12/15/17 1826)  iopamidol (ISOVUE-370) 76 % injection (has no administration in time range)  iopamidol (ISOVUE-370) 76 % injection 100 mL (100 mLs Intravenous Contrast Given 12/15/17 1827)    Mobility Walks BUT ON BEDREST

## 2017-12-15 NOTE — ED Triage Notes (Signed)
Pt wife states the pt's doctor called and informed them he may have a blood clot. Pt had D-dimer of 4.54. Pt states his left leg hurts. Pt's wife noticed the pt has been wheezing intermittently since coming home from hospital 5 days ago.

## 2017-12-15 NOTE — ED Notes (Signed)
Patient transported to CT 

## 2017-12-16 ENCOUNTER — Observation Stay (HOSPITAL_BASED_OUTPATIENT_CLINIC_OR_DEPARTMENT_OTHER): Payer: No Typology Code available for payment source

## 2017-12-16 ENCOUNTER — Other Ambulatory Visit: Payer: Self-pay

## 2017-12-16 DIAGNOSIS — T81718A Complication of other artery following a procedure, not elsewhere classified, initial encounter: Secondary | ICD-10-CM

## 2017-12-16 DIAGNOSIS — I724 Aneurysm of artery of lower extremity: Secondary | ICD-10-CM | POA: Diagnosis not present

## 2017-12-16 LAB — CBC WITH DIFFERENTIAL/PLATELET
BASOS PCT: 0 %
Basophils Absolute: 0 10*3/uL (ref 0.0–0.1)
EOS ABS: 0.5 10*3/uL (ref 0.0–0.7)
Eosinophils Relative: 7 %
HCT: 26.1 % — ABNORMAL LOW (ref 39.0–52.0)
HEMOGLOBIN: 8.8 g/dL — AB (ref 13.0–17.0)
LYMPHS ABS: 1.1 10*3/uL (ref 0.7–4.0)
Lymphocytes Relative: 17 %
MCH: 33.5 pg (ref 26.0–34.0)
MCHC: 33.7 g/dL (ref 30.0–36.0)
MCV: 99.2 fL (ref 78.0–100.0)
MONOS PCT: 36 %
Monocytes Absolute: 2.3 10*3/uL — ABNORMAL HIGH (ref 0.1–1.0)
NEUTROS PCT: 40 %
Neutro Abs: 2.5 10*3/uL (ref 1.7–7.7)
Platelets: 87 10*3/uL — ABNORMAL LOW (ref 150–400)
RBC: 2.63 MIL/uL — AB (ref 4.22–5.81)
RDW: 14.9 % (ref 11.5–15.5)
WBC: 6.3 10*3/uL (ref 4.0–10.5)

## 2017-12-16 LAB — URIC ACID: Uric Acid, Serum: 6.6 mg/dL (ref 4.4–7.6)

## 2017-12-16 LAB — TYPE AND SCREEN
ABO/RH(D): O NEG
ANTIBODY SCREEN: NEGATIVE

## 2017-12-16 LAB — GLUCOSE, CAPILLARY: GLUCOSE-CAPILLARY: 133 mg/dL — AB (ref 65–99)

## 2017-12-16 MED ORDER — PROPRANOLOL HCL 20 MG PO TABS: 20.0000 mg | ORAL_TABLET | Freq: Two times a day (BID) | ORAL | Status: DC

## 2017-12-16 MED ORDER — MIRTAZAPINE 15 MG PO TABS: 15.0000 mg | ORAL_TABLET | Freq: Every day | ORAL | Status: DC

## 2017-12-16 MED ORDER — VITAMIN C 500 MG PO TABS: 500.0000 mg | ORAL_TABLET | Freq: Every day | ORAL | Status: DC

## 2017-12-16 MED ORDER — VITAMIN D 1000 UNITS PO TABS: 2000.0000 [IU] | ORAL_TABLET | Freq: Every day | ORAL | Status: DC

## 2017-12-16 MED ORDER — RIFAXIMIN 550 MG PO TABS
550.0000 mg | ORAL_TABLET | Freq: Two times a day (BID) | ORAL | Status: DC
  Filled 2017-12-16: qty 1

## 2017-12-16 MED ORDER — VITAMIN B-12 100 MCG PO TABS
100.0000 ug | ORAL_TABLET | Freq: Every day | ORAL | Status: DC
  Filled 2017-12-16: qty 1

## 2017-12-16 MED ORDER — TAMSULOSIN HCL 0.4 MG PO CAPS: 0.4000 mg | ORAL_CAPSULE | Freq: Every day | ORAL | Status: DC

## 2017-12-16 MED ORDER — ACETAMINOPHEN 500 MG PO TABS: 500.0000 mg | ORAL_TABLET | Freq: Every day | ORAL | Status: DC | PRN

## 2017-12-16 MED ORDER — SENNOSIDES-DOCUSATE SODIUM 8.6-50 MG PO TABS: 1.0000 | ORAL_TABLET | Freq: Every day | ORAL | Status: DC

## 2017-12-16 MED ORDER — TRAMADOL HCL 50 MG PO TABS: 50.0000 mg | ORAL_TABLET | Freq: Four times a day (QID) | ORAL | Status: DC | PRN

## 2017-12-16 MED ORDER — SALINE SPRAY 0.65 % NA SOLN
1.0000 | NASAL | Status: DC | PRN
  Filled 2017-12-16: qty 44

## 2017-12-16 NOTE — Plan of Care (Signed)
Pt alert and oriented, swelling in R groin and L foot.  [place] tech at bedside, RN will monitor.

## 2017-12-16 NOTE — Progress Notes (Signed)
Right lower extremity pseudoaneurysm surveillance has been completed. A 2.4 cm pseudoaneurysm was visualized on CT of abdomen/pelvis on 12/15/17 however, there appears to be no evidence of a pseudoaneurysm of the femoral artery after extensive interrogation of the area. The right external iliac, common femoral, and superficial femoral arteries were interrogated with no visualization a pseudoaneurysm. A 1.6 cm heterogenous area with no internal vascularization is visualized in the right groin suggestive of a possible hematoma.  Attempted to page Dr. Donzetta Matters x2 and Leontine Locket.   12/16/17 12:25 PM Mike Ferguson RVT

## 2017-12-16 NOTE — Progress Notes (Addendum)
Referring Physician(s): [name],V/Pickering,N  Supervising Physician: Arne Cleveland  Patient Status:  [facility] - In-pt  Chief Complaint:  Right groin pseudoaneurysm  Subjective: Patient with history of cirrhosis/multifocal [place], status post pre-Y 90 hepatic/visceral arteriogram with coil embolization of pancreaticoduodenal artery on 12/08/2017; subsequently developed tiny area of extravasation from right common femoral artery access site with associated hematoma extending from right pelvis to right lower abdominal region.  Additional manual compression applied with apparent hemostasis. He was admitted by [facility] afterwards and subsequently d/c'd home 5/25. He presented to [facility] ED yesterday with rt pelvic/groin and left foot pain.  Left foot xray revealed nonspecific soft tissue swelling in the region of the MTP joint of the great toe sometimes seen with gout.  No osseous finding.  CT angio chest/abd pelvis revealed no PE but right inguinal hematoma and interval development of a 2.4 cm pseudoaneurysm adjacent to the right femoral artery.  Labs from yesterday included creatinine 0.87, potassium 3.9, total bilirubin 2.3, WBC 9.1, hemoglobin 9.1(9.0), platelets 86k.  Vascular surgery consulted and initially recommend ultrasound-guided pseudoaneurysm compression.  Additional options include thrombin injection versus surgical intervention.  Patient currently without fever, chest pain, dyspnea, cough, vomiting or visible bleeding.  He continues to have some right groin/suprapubic, back discomfort along with some left foot discomfort swelling. Past Medical History:  Diagnosis Date  . Arthritis    generalized-back  . Cirrhosis ([place])   . Cirrhosis, alcoholic ([place])   . Esophageal varices ([place])   . Hearing impaired person, bilateral    hearing aids bilaterally  . Hepatocellular carcinoma ([place])   . Hypertension    Past Surgical History:  Procedure Laterality Date  . HERNIA REPAIR     RIH  . IR ANGIOGRAM  SELECTIVE EACH ADDITIONAL VESSEL  07/25/2017  . IR ANGIOGRAM SELECTIVE EACH ADDITIONAL VESSEL  07/25/2017  . IR ANGIOGRAM SELECTIVE EACH ADDITIONAL VESSEL  07/25/2017  . IR ANGIOGRAM SELECTIVE EACH ADDITIONAL VESSEL  07/25/2017  . IR ANGIOGRAM SELECTIVE EACH ADDITIONAL VESSEL  12/08/2017  . IR ANGIOGRAM SELECTIVE EACH ADDITIONAL VESSEL  12/08/2017  . IR ANGIOGRAM SELECTIVE EACH ADDITIONAL VESSEL  12/08/2017  . IR ANGIOGRAM SELECTIVE EACH ADDITIONAL VESSEL  12/08/2017  . IR ANGIOGRAM SELECTIVE EACH ADDITIONAL VESSEL  12/08/2017  . IR ANGIOGRAM VISCERAL SELECTIVE  07/25/2017  . IR ANGIOGRAM VISCERAL SELECTIVE  07/25/2017  . IR ANGIOGRAM VISCERAL SELECTIVE  12/08/2017  . IR ANGIOGRAM VISCERAL SELECTIVE  12/08/2017  . IR EMBO ARTERIAL NOT HEMORR HEMANG INC GUIDE ROADMAPPING  12/08/2017  . IR EMBO TUMOR ORGAN ISCHEMIA INFARCT INC GUIDE ROADMAPPING  07/25/2017  . IR FLUORO RM 30-60 MIN  12/08/2017  . IR GENERIC HISTORICAL  06/17/2016   IR RADIOLOGIST EVAL & MGMT 06/17/2016 Sandi Mariscal, MD [facility] INTERV RAD  . IR GENERIC HISTORICAL  07/21/2016   IR RADIOLOGIST EVAL & MGMT 07/21/2016 Sandi Mariscal, MD [facility] INTERV RAD  . IR RADIOLOGIST EVAL & MGMT  10/26/2016  . IR RADIOLOGIST EVAL & MGMT  04/05/2017  . IR RADIOLOGIST EVAL & MGMT  01/11/2017  . IR RADIOLOGIST EVAL & MGMT  06/28/2017  . IR RADIOLOGIST EVAL & MGMT  08/18/2017  . IR RADIOLOGIST EVAL & MGMT  11/17/2017  . IR US GUIDE VASC ACCESS RIGHT  07/25/2017  . IR US GUIDE VASC ACCESS RIGHT  12/08/2017  . Left rotator cuff        Allergies: Erythromycin  Medications: Prior to Admission medications   Medication Sig Start Date End Date Taking? Authorizing Provider  acetaminophen (TYLENOL) 500 MG tablet Take 500 mg by mouth daily as needed for mild pain.   Yes [provider]  cholecalciferol (VITAMIN D) 1000 units tablet Take 2,000 Units by mouth daily.   Yes [provider]  mirtazapine (REMERON) 15 MG tablet Take 15 mg by mouth at bedtime.   Yes  [provider]  polyethylene glycol (MIRALAX / GLYCOLAX) packet Take 17 g by mouth 2 (two) times daily. 12/10/17  Yes Sheikh, Omair Latif, DO  propranolol (INDERAL) 20 MG tablet Take 20 mg by mouth 2 (two) times daily. 05/31/16  Yes [provider]  rifaximin (XIFAXAN) 550 MG TABS tablet Take 550 mg by mouth 2 (two) times daily.   Yes [provider]  senna-docusate (SENOKOT-S) 8.6-50 MG tablet Take 1 tablet by mouth at bedtime. 12/10/17  Yes Sheikh, Omair Latif, DO  tamsulosin (FLOMAX) 0.4 MG CAPS capsule TAKE 1 capsule Once in the evening Orally 30 day(s) 05/20/17  Yes [provider]  traMADol (ULTRAM) 50 MG tablet Take 50 mg by mouth every 6 (six) hours as needed for moderate pain or severe pain.   Yes [provider]  vitamin B-12 (CYANOCOBALAMIN) 100 MCG tablet Take 100 mcg by mouth daily.   Yes [provider]  vitamin C (ASCORBIC ACID) 500 MG tablet Take 500 mg by mouth daily.   Yes [provider]  COLCRYS 0.6 MG tablet Take 0.6 mg by mouth daily.  12/15/17   [provider]  loratadine-pseudoephedrine (CLARITIN-D 24-HOUR) 10-240 MG 24 hr tablet Take 1 tablet by mouth daily as needed for allergies.     [provider]  sodium chloride (OCEAN) 0.65 % SOLN nasal spray Place 1 spray into both nostrils as needed for congestion.    [provider]     Vital Signs: BP 131/61 (BP Location: Left Arm)   Pulse 64   Temp 98.6 F (37 C) (Oral)   Resp 18   Ht 5\' 5"  (1.651 m)   Wt 142 lb 13.7 oz (64.8 kg)   SpO2 99%   BMI 23.77 kg/m   Physical Exam awake, alert.  Chest clear to auscultation bilaterally.  Heart with regular rate and rhythm.  Abdomen soft, positive bowel sounds, tender right lower abdominal/pelvic/suprapubic region with scattered ecchymosis/hematoma, no active external bleeding noted; Right lower extremity with plus minus PT, 2+ DP pulse, no edema; left lower extremity with plus minus left PT  pulse, 2+ DP pulse, swelling of left distal foot and toes with some mild erythema.  Sensory/motor fxn intact function intact.  Imaging: Ct Angio Chest Pe W And/or Wo Contrast  Result Date: 12/15/2017 CLINICAL DATA:  Pt wife states the pt's doctor called and informed them he may have a blood clot. Pt had D-dimer of 4.54. Pt states his left leg hurts. Pt's wife noticed the pt has been wheezing intermittently since coming home from hospital 5 days ago. EXAM: CT ANGIOGRAPHY CHEST CT ABDOMEN AND PELVIS WITH CONTRAST TECHNIQUE: Multidetector CT imaging of the chest was performed using the standard protocol during bolus administration of intravenous contrast. Multiplanar CT image reconstructions and MIPs were obtained to evaluate the vascular anatomy. Multidetector CT imaging of the abdomen and pelvis was performed using the standard protocol during bolus administration of intravenous contrast. CONTRAST:  <See Chart> ISOVUE-300 IOPAMIDOL (ISOVUE-300) INJECTION 61%, 121mL ISOVUE-370 IOPAMIDOL (ISOVUE-370) INJECTION 76% COMPARISON:  12/08/2017 and multiple prior studies including 2011 FINDINGS: CTA CHEST FINDINGS Cardiovascular: The pulmonary arteries are well opacified. There is  no evidence for acute pulmonary embolus. Coronary artery calcifications are present. There is atherosclerotic calcification of the thoracic aorta not associated with aneurysm. Mediastinum/Nodes: No enlarged mediastinal, hilar, or axillary lymph nodes. Thyroid gland, trachea, and esophagus demonstrate no significant findings. Lungs/Pleura: Mild patient motion artifact. No suspicious pulmonary nodules. No pleural effusions or consolidations. Airways are patent. Musculoskeletal: No chest wall abnormality. No acute or significant osseous findings. Review of the MIP images confirms the above findings. CT ABDOMEN and PELVIS FINDINGS Hepatobiliary: Cirrhotic contour of the liver. Low-attenuation again identified in the anterior segmental RIGHT hepatic  lobe, without associated hyperemia best seen on the previous exam. Gallbladder is present. Pancreas: Unremarkable. No pancreatic ductal dilatation or surrounding inflammatory changes. Spleen: UPPER normal in size.  No focal abnormality. Adrenals/Urinary Tract: No urinary tract obstruction. Urinary bladder is unremarkable. Stomach/Bowel: The stomach and small bowel loops are normal in appearance. The appendix is well seen and has a normal appearance. Colon is normal in appearance. Vascular/Lymphatic: In the RIGHT inguinal region there is a moderate hematoma following previous arterial access. Adjacent to the femoral artery, there is a contrast-enhancing bilobed circumscribed mass measuring 1.3 x 2.4 centimeters and consistent with a pseudoaneurysm. Reproductive: No significant adenopathy. Other: Prostatic calcifications are present. Musculoskeletal: No acute osseous abnormalities. Benign sclerotic lesion within the RIGHT posterior iliac wing. Review of the MIP images confirms the above findings. IMPRESSION: 1. Technically adequate exam showing no acute pulmonary embolus. 2. Atherosclerosis of the coronary arteries an aorta. Aortic Atherosclerosis (ICD10-I70.0). 3. Cirrhosis and RIGHT hepatic lobe lesion. 4. RIGHT inguinal hematoma and interval development of 2.4 cm pseudoaneurysm adjacent to the RIGHT femoral artery. These results were called by telephone at the time of interpretation on 12/15/2017 at 7:13 pm to Dr. Davonna Belling , who verbally acknowledged these results. Electronically Signed   By: Nolon Nations M.D.   On: 12/15/2017 19:14   Ct Abdomen Pelvis W Contrast  Result Date: 12/15/2017 CLINICAL DATA:  Pt wife states the pt's doctor called and informed them he may have a blood clot. Pt had D-dimer of 4.54. Pt states his left leg hurts. Pt's wife noticed the pt has been wheezing intermittently since coming home from hospital 5 days ago. EXAM: CT ANGIOGRAPHY CHEST CT ABDOMEN AND PELVIS WITH CONTRAST  TECHNIQUE: Multidetector CT imaging of the chest was performed using the standard protocol during bolus administration of intravenous contrast. Multiplanar CT image reconstructions and MIPs were obtained to evaluate the vascular anatomy. Multidetector CT imaging of the abdomen and pelvis was performed using the standard protocol during bolus administration of intravenous contrast. CONTRAST:  <See Chart> ISOVUE-300 IOPAMIDOL (ISOVUE-300) INJECTION 61%, 158mL ISOVUE-370 IOPAMIDOL (ISOVUE-370) INJECTION 76% COMPARISON:  12/08/2017 and multiple prior studies including 2011 FINDINGS: CTA CHEST FINDINGS Cardiovascular: The pulmonary arteries are well opacified. There is no evidence for acute pulmonary embolus. Coronary artery calcifications are present. There is atherosclerotic calcification of the thoracic aorta not associated with aneurysm. Mediastinum/Nodes: No enlarged mediastinal, hilar, or axillary lymph nodes. Thyroid gland, trachea, and esophagus demonstrate no significant findings. Lungs/Pleura: Mild patient motion artifact. No suspicious pulmonary nodules. No pleural effusions or consolidations. Airways are patent. Musculoskeletal: No chest wall abnormality. No acute or significant osseous findings. Review of the MIP images confirms the above findings. CT ABDOMEN and PELVIS FINDINGS Hepatobiliary: Cirrhotic contour of the liver. Low-attenuation again identified in the anterior segmental RIGHT hepatic lobe, without associated hyperemia best seen on the previous exam. Gallbladder is present. Pancreas: Unremarkable. No pancreatic ductal dilatation or surrounding inflammatory changes.  Spleen: UPPER normal in size.  No focal abnormality. Adrenals/Urinary Tract: No urinary tract obstruction. Urinary bladder is unremarkable. Stomach/Bowel: The stomach and small bowel loops are normal in appearance. The appendix is well seen and has a normal appearance. Colon is normal in appearance. Vascular/Lymphatic: In the RIGHT  inguinal region there is a moderate hematoma following previous arterial access. Adjacent to the femoral artery, there is a contrast-enhancing bilobed circumscribed mass measuring 1.3 x 2.4 centimeters and consistent with a pseudoaneurysm. Reproductive: No significant adenopathy. Other: Prostatic calcifications are present. Musculoskeletal: No acute osseous abnormalities. Benign sclerotic lesion within the RIGHT posterior iliac wing. Review of the MIP images confirms the above findings. IMPRESSION: 1. Technically adequate exam showing no acute pulmonary embolus. 2. Atherosclerosis of the coronary arteries an aorta. Aortic Atherosclerosis (ICD10-I70.0). 3. Cirrhosis and RIGHT hepatic lobe lesion. 4. RIGHT inguinal hematoma and interval development of 2.4 cm pseudoaneurysm adjacent to the RIGHT femoral artery. These results were called by telephone at the time of interpretation on 12/15/2017 at 7:13 pm to Dr. Davonna Belling , who verbally acknowledged these results. Electronically Signed   By: Nolon Nations M.D.   On: 12/15/2017 19:14   Dg Foot Complete Left  Result Date: 12/15/2017 CLINICAL DATA:  Left foot pain and swelling beginning yesterday. No known injury. EXAM: LEFT FOOT - COMPLETE 3+ VIEW COMPARISON:  [id number]-[id number] FINDINGS: Nonspecific soft tissue swelling in the region of the MTP joint of the great toe. No evidence of erosion. The remainder the exam is negative. Question gout. Arterial calcification incidentally noted. IMPRESSION: Nonspecific soft tissue swelling in the region of the MTP joint of the great toe at could be seen with gout. No osseous finding however. Electronically Signed   By: Nelson Chimes M.D.   On: 12/15/2017 17:09    Labs:  CBC: Recent Labs    12/09/17 1948 12/10/17 0249 12/10/17 0910 12/15/17 1631  WBC 6.1 6.4 6.7 9.1  HGB 9.6* 9.0* 9.0* 9.1*  HCT 28.4* 26.3* 26.1* 26.7*  PLT 59* 53* 47* 86*    COAGS: Recent Labs    07/25/17 1146 12/08/17 0803  INR 1.14 1.12      BMP: Recent Labs    12/08/17 0803 12/09/17 0520 12/10/17 0249 12/15/17 1631  NA 138 139 140 136  K 5.1 4.3 4.1 3.9  CL 100* 104 109 103  CO2 25 21* 23 26  GLUCOSE 83 95 110* 95  BUN 11 13 9 10   CALCIUM 9.5 9.0 8.5* 8.7*  CREATININE 1.07 0.88 0.94 0.87  GFRNONAA >60 >60 >60 >60  GFRAA >60 >60 >60 >60    LIVER FUNCTION TESTS: Recent Labs    12/08/17 0803 12/09/17 0520 12/10/17 0249 12/15/17 1631  BILITOT 1.6* 1.8* 1.5* 2.3*  AST 75* 43* 40 45*  ALT 45 31 25 31   ALKPHOS 124 91 83 133*  PROT 8.5* 6.6 6.1* 6.6  ALBUMIN 5.1* 4.0 3.6 3.8    Assessment and Plan: Patient with history of cirrhosis/multifocal [place], status post pre-Y 90 hepatic/visceral arteriogram with coil embolization of pancreaticoduodenal artery on 12/08/2017; subsequently developed tiny area of extravasation from right common femoral artery access site with associated hematoma extending from right pelvis to right lower abdominal region.  Additional manual compression applied with apparent hemostasis. He was admitted by [facility] afterwards and subsequently d/c'd home 5/25. He presented to [facility] ED yesterday with rt pelvic/groin and left foot pain.  Left foot xray revealed nonspecific soft tissue swelling in the region of the MTP joint of the  great toe sometimes seen with gout.  No osseous finding.  CT angio chest/abd pelvis revealed no PE but right inguinal hematoma and interval development of a 2.4 cm pseudoaneurysm adjacent to the right femoral artery.  Labs from yesterday included creatinine 0.87, potassium 3.9, total bilirubin 2.3, WBC 9.1, hemoglobin 9.1(9.0), platelets 86k.  Vascular surgery consulted and initially recommend ultrasound-guided pseudoaneurysm compression.  Other options would include thrombin injection versus surgical intervention.  Ultrasound vascular tech currently in room.  Will check follow-up CBC and uric acid level.   Electronically Signed: D. Rowe Robert, PA-C 12/16/2017, 10:38 AM   I spent  a total of 20 minutes at the the patient's bedside AND on the patient's hospital floor or unit, greater than 50% of which was counseling/coordinating care for right femoral artery pseudoaneurysm    Patient ID: Mike Ferguson, male   DOB: 04/29/1941, 77 y.o.   MRN: [id number]

## 2017-12-16 NOTE — Discharge Instructions (Signed)
Pseudoaneurysm An aneurysm is a bulge in an artery. A pseudoaneurysm happens when an artery is injured and blood leaks out to form a sac-like bulge. The bulge can break open, causing bleeding in the nearby tissues. What are the causes? The most common cause of this condition is a procedure such as an angiogram in which a thin tube (catheter) is inserted into an artery. After an angiogram, the insertion site on the artery should close back up all the way. If it does not, blood may leak out of the artery. Other causes of a pseudoaneurysminclude:  Trauma to the walls of an artery, such as from a stabbing injury or a deep cut.  Bypass artery grafting surgery, which is a type of surgery that makes blood flow to the heart better.  An infection that affects the walls of an artery.  A heart attack (myocardial infarction).  An aneurysm that bursts (ruptures).  What are the signs or symptoms? Symptoms of this condition include:  Pain, soreness, or tenderness at the site of the pseudoaneurysm.  Swelling.  Bruising or a change in skin color.  A throbbing mass or lump at the site.  How is this diagnosed? This condition may be diagnosed based on your symptoms, a physical exam, and an imaging test called a Doppler ultrasound. This imaging test uses sound waves to show the blood flow in the arteries and the pseudoaneurysm. How is this treated? This condition may go away on its own without treatment. To help prevent bleeding that cannot be controlled, or to help prevent other problems, your health care provider may suggest one of these treatments:  Injecting a blood-clotting enzyme, such as thrombin, into the site.  Fixing the artery with surgery.  Putting pressure (compression) on the pseudoaneurysm.  Follow these instructions at home:  Take over-the-counter and prescription medicines only as told by your health care provider.  Return to your normal activities as told by your health care  provider. Ask your health care provider what activities are safe for you.  Keep all follow-up visits as told by your health care provider. This is important. Contact a health care provider if:  Your pain, soreness, or tenderness at the pseudoaneurysm site keeps getting worse.  You have swelling at the site. Get help right away if:  You have severe or ongoing (persistent) pain at the site of the pseudoaneurysm.  There is bleeding or drainage from the site.  The part of your body where the pseudoaneurysm is located changes color or becomes painful, cold, or numb.  You have chest pain or shortness of breath.  You feel like you might faint or you faint. This information is not intended to replace advice given to you by your health care provider. Make sure you discuss any questions you have with your health care provider. Document Released: 12/22/2007 Document Revised: 04/16/2016 Document Reviewed: 04/16/2016 Elsevier Interactive Patient Education  2018 [employer]  

## 2017-12-16 NOTE — Consult Note (Addendum)
Hospital Consult    Reason for Consult:  psa in right groin Referring Physician:  Dr. Barbie Banner MRN #:  [id number]  History of Present Illness: This is a 77 y.o. male recently underwent pancreatic artery embolization from a right femoral approach.  Postoperatively he had right-sided retroperitoneal bleed that was controlled without any intervention.  He did have a CT scan that demonstrated hematoma there.  Yesterday he presented to the with left lower extremity swelling with concern for PE and DVT but studies were negative.  On CT scan he was found to have a pseudoaneurysm in his right groin at the previous cannulation site.  He does have some mild tenderness there as well as hematoma that is healing.  He denies any constitutional symptoms.  Past Medical History:  Diagnosis Date  . Arthritis    generalized-back  . Cirrhosis ([place])   . Cirrhosis, alcoholic ([place])   . Esophageal varices ([place])   . Hearing impaired person, bilateral    hearing aids bilaterally  . Hepatocellular carcinoma ([place])   . Hypertension     Past Surgical History:  Procedure Laterality Date  . HERNIA REPAIR     RIH  . IR ANGIOGRAM SELECTIVE EACH ADDITIONAL VESSEL  07/25/2017  . IR ANGIOGRAM SELECTIVE EACH ADDITIONAL VESSEL  07/25/2017  . IR ANGIOGRAM SELECTIVE EACH ADDITIONAL VESSEL  07/25/2017  . IR ANGIOGRAM SELECTIVE EACH ADDITIONAL VESSEL  07/25/2017  . IR ANGIOGRAM SELECTIVE EACH ADDITIONAL VESSEL  12/08/2017  . IR ANGIOGRAM SELECTIVE EACH ADDITIONAL VESSEL  12/08/2017  . IR ANGIOGRAM SELECTIVE EACH ADDITIONAL VESSEL  12/08/2017  . IR ANGIOGRAM SELECTIVE EACH ADDITIONAL VESSEL  12/08/2017  . IR ANGIOGRAM SELECTIVE EACH ADDITIONAL VESSEL  12/08/2017  . IR ANGIOGRAM VISCERAL SELECTIVE  07/25/2017  . IR ANGIOGRAM VISCERAL SELECTIVE  07/25/2017  . IR ANGIOGRAM VISCERAL SELECTIVE  12/08/2017  . IR ANGIOGRAM VISCERAL SELECTIVE  12/08/2017  . IR EMBO ARTERIAL NOT HEMORR HEMANG INC GUIDE ROADMAPPING  12/08/2017  . IR EMBO TUMOR ORGAN  ISCHEMIA INFARCT INC GUIDE ROADMAPPING  07/25/2017  . IR FLUORO RM 30-60 MIN  12/08/2017  . IR GENERIC HISTORICAL  06/17/2016   IR RADIOLOGIST EVAL & MGMT 06/17/2016 Sandi Mariscal, MD [facility] INTERV RAD  . IR GENERIC HISTORICAL  07/21/2016   IR RADIOLOGIST EVAL & MGMT 07/21/2016 Sandi Mariscal, MD [facility] INTERV RAD  . IR RADIOLOGIST EVAL & MGMT  10/26/2016  . IR RADIOLOGIST EVAL & MGMT  04/05/2017  . IR RADIOLOGIST EVAL & MGMT  01/11/2017  . IR RADIOLOGIST EVAL & MGMT  06/28/2017  . IR RADIOLOGIST EVAL & MGMT  08/18/2017  . IR RADIOLOGIST EVAL & MGMT  11/17/2017  . IR US GUIDE VASC ACCESS RIGHT  07/25/2017  . IR US GUIDE VASC ACCESS RIGHT  12/08/2017  . Left rotator cuff      Allergies  Allergen Reactions  . Erythromycin Other (See Comments)    Abd cramps    Prior to Admission medications   Medication Sig Start Date End Date Taking? Authorizing Provider  acetaminophen (TYLENOL) 500 MG tablet Take 500 mg by mouth daily as needed for mild pain.   Yes [provider]  cholecalciferol (VITAMIN D) 1000 units tablet Take 2,000 Units by mouth daily.   Yes [provider]  mirtazapine (REMERON) 15 MG tablet Take 15 mg by mouth at bedtime.   Yes [provider]  polyethylene glycol (MIRALAX / GLYCOLAX) packet Take 17 g by mouth 2 (two) times daily. 12/10/17  Yes Raiford Noble  Latif, DO  propranolol (INDERAL) 20 MG tablet Take 20 mg by mouth 2 (two) times daily. 05/31/16  Yes [provider]  rifaximin (XIFAXAN) 550 MG TABS tablet Take 550 mg by mouth 2 (two) times daily.   Yes [provider]  senna-docusate (SENOKOT-S) 8.6-50 MG tablet Take 1 tablet by mouth at bedtime. 12/10/17  Yes Sheikh, Omair Latif, DO  tamsulosin (FLOMAX) 0.4 MG CAPS capsule TAKE 1 capsule Once in the evening Orally 30 day(s) 05/20/17  Yes [provider]  traMADol (ULTRAM) 50 MG tablet Take 50 mg by mouth every 6 (six) hours as needed for moderate pain or severe pain.   Yes [provider]  vitamin B-12 (CYANOCOBALAMIN) 100 MCG tablet Take 100 mcg by mouth daily.   Yes [provider]  vitamin C (ASCORBIC ACID) 500 MG tablet Take 500 mg by mouth daily.   Yes [provider]  COLCRYS 0.6 MG tablet Take 0.6 mg by mouth daily.  12/15/17   [provider]  loratadine-pseudoephedrine (CLARITIN-D 24-HOUR) 10-240 MG 24 hr tablet Take 1 tablet by mouth daily as needed for allergies.     [provider]  sodium chloride (OCEAN) 0.65 % SOLN nasal spray Place 1 spray into both nostrils as needed for congestion.    [provider]    Social History   Socioeconomic History  . Marital status: Married    Spouse name: Not on file  . Number of children: Not on file  . Years of education: Not on file  . Highest education level: Not on file  Occupational History  . Not on file  Social Needs  . Financial resource strain: Not on file  . Food insecurity:    Worry: Not on file    Inability: Not on file  . Transportation needs:    Medical: Not on file    Non-medical: Not on file  Tobacco Use  . Smoking status: Former Smoker    Last attempt to quit: 06/24/1986    Years since quitting: 31.5  . Smokeless tobacco: Never Used  Substance and Sexual Activity  . Alcohol use: No    Comment: 2011 Quit- ETOH abuse  . Drug use: No  . Sexual activity: Not Currently  Lifestyle  . Physical activity:    Days per week: Not on file    Minutes per session: Not on file  . Stress: Not on file  Relationships  . Social connections:    Talks on phone: Not on file    Gets together: Not on file    Attends religious service: Not on file    Active member of club or organization: Not on file    Attends meetings of clubs or organizations: Not on file    Relationship status: Not on file  . Intimate partner violence:    Fear of current or ex partner: Not on file    Emotionally abused: Not on file    Physically abused: Not on file    Forced sexual  activity: Not on file  Other Topics Concern  . Not on file  Social History Narrative  . Not on file     No family history on file.  ROS: [x]  Positive   [ ]  Negative   [ ]  All sytems reviewed and are negative  Cardiovascular: []  chest pain/pressure []  palpitations []  SOB lying flat []  DOE []  pain in legs while walking []  pain in legs at rest []  pain in legs at night []   non-healing ulcers []  hx of DVT [x]  swelling in legs  Pulmonary: []  productive cough []  asthma/wheezing []  home O2  Neurologic: []  weakness in []  arms []  legs []  numbness in []  arms []  legs []  hx of CVA []  mini stroke [] difficulty speaking or slurred speech []  temporary loss of vision in one eye []  dizziness  Hematologic: [x]  hx of cancer []  bleeding problems []  problems with blood clotting easily  Endocrine:   []  diabetes []  thyroid disease  GI []  vomiting blood []  blood in stool  GU: []  CKD/renal failure []  HD--[]  M/W/F or []  T/T/S []  burning with urination []  blood in urine  Psychiatric: []  anxiety []  depression  Musculoskeletal: []  arthritis []  joint pain  Integumentary: []  rashes []  ulcers  Constitutional: []  fever []  chills   Physical Examination  Vitals:   12/15/17 2239 12/16/17 0523  BP: (!) 149/74 131/61  Pulse: 70 64  Resp: 18 18  Temp: 98.1 F (36.7 C) 98.6 F (37 C)  SpO2: 99% 99%   Body mass index is 23.77 kg/m.  General:  WDWN in NAD Gait: Not observed HENT: WNL, normocephalic Pulmonary: normal non-labored breathing Cardiac: Palpable bilateral femoral pulses as well as popliteal pulses Right groin has a palpable bulge just superior to the groin crease. Abdomen: soft, NT/ND, no masses Extremities: Swelling of left lower extremity, no swelling right lower extremity  Neurologic: A&O X 3; Appropriate Affect ; SENSATION: normal; MOTOR FUNCTION:  moving all extremities equally. Speech is fluent/normal   CBC    Component Value Date/Time   WBC 9.1  12/15/2017 1631   RBC 2.73 (L) 12/15/2017 1631   HGB 9.1 (L) 12/15/2017 1631   HCT 26.7 (L) 12/15/2017 1631   PLT 86 (L) 12/15/2017 1631   MCV 97.8 12/15/2017 1631   MCH 33.3 12/15/2017 1631   MCHC 34.1 12/15/2017 1631   RDW 14.4 12/15/2017 1631   LYMPHSABS 2.6 12/15/2017 1631   MONOABS 2.5 (H) 12/15/2017 1631   EOSABS 0.5 12/15/2017 1631   BASOSABS 0.0 12/15/2017 1631    BMET    Component Value Date/Time   NA 136 12/15/2017 1631   K 3.9 12/15/2017 1631   CL 103 12/15/2017 1631   CO2 26 12/15/2017 1631   GLUCOSE 95 12/15/2017 1631   BUN 10 12/15/2017 1631   CREATININE 0.87 12/15/2017 1631   CREATININE 1.12 12/16/2016 1015   CALCIUM 8.7 (L) 12/15/2017 1631   GFRNONAA >60 12/15/2017 1631   GFRNONAA 80 07/14/2016 0944   GFRAA >60 12/15/2017 1631   GFRAA >89 07/14/2016 0944    COAGS: Lab Results  Component Value Date   INR 1.12 12/08/2017   INR 1.14 07/25/2017   INR 1.1 10/04/2016     Non-Invasive Vascular Imaging:   IMPRESSION: 1. Technically adequate exam showing no acute pulmonary embolus. 2. Atherosclerosis of the coronary arteries an aorta. Aortic Atherosclerosis (ICD10-I70.0). 3. Cirrhosis and RIGHT hepatic lobe lesion. 4. RIGHT inguinal hematoma and interval development of 2.4 cm pseudoaneurysm adjacent to the RIGHT femoral artery.   ASSESSMENT/PLAN: This is a 77 y.o. male with pseudoaneurysm in his right groin after embolization of pancreatic mass.  He is only minimally tender in this groin has a very narrow neck on the pseudoaneurysm.  Possibly we can get this to resolve with compression given that he is not on anticoagulation.  The other options would be thrombin injection versus surgical intervention all of which I spoke with the patient about.  They demonstrate good understanding we will attempt to  get compression of the pseudo-today with our ultrasound techs and if this does not work we will proceed with more invasive interventions.  Joelyn Lover C. Donzetta Matters,  MD [facility] Office: [phone] Pager: [phone]  Addendum: I reviewed the arterial duplex which does not demonstrate any notable pseudoaneurysm.  This is consistent with my physical exam where I could not feel anything pulsatile in the mass.  As such there is no intervention that is needed at this time from vascular surgery and I communicated this with interventional radiology.  Servando Snare

## 2017-12-16 NOTE — Discharge Summary (Signed)
Patient ID: CARDALE DORER MRN: [id number] DOB/AGE: January 10, 1941 77 y.o.  Admit date: 12/15/2017 Discharge date: 12/16/2017  Supervising Physician: Arne Cleveland  Patient Status: [facility] - In-pt  Admission Diagnoses: Right common femoral artery pseudoaneurysm/groin hematoma  Discharge Diagnoses: Right groin hematoma Active Problems:   Pseudoaneurysm of femoral artery following procedure [facility])  Past Medical History:  Diagnosis Date  . Arthritis    generalized-back  . Cirrhosis ([place])   . Cirrhosis, alcoholic ([place])   . Esophageal varices ([place])   . Hearing impaired person, bilateral    hearing aids bilaterally  . Hepatocellular carcinoma ([place])   . Hypertension    Past Surgical History:  Procedure Laterality Date  . HERNIA REPAIR     RIH  . IR ANGIOGRAM SELECTIVE EACH ADDITIONAL VESSEL  07/25/2017  . IR ANGIOGRAM SELECTIVE EACH ADDITIONAL VESSEL  07/25/2017  . IR ANGIOGRAM SELECTIVE EACH ADDITIONAL VESSEL  07/25/2017  . IR ANGIOGRAM SELECTIVE EACH ADDITIONAL VESSEL  07/25/2017  . IR ANGIOGRAM SELECTIVE EACH ADDITIONAL VESSEL  12/08/2017  . IR ANGIOGRAM SELECTIVE EACH ADDITIONAL VESSEL  12/08/2017  . IR ANGIOGRAM SELECTIVE EACH ADDITIONAL VESSEL  12/08/2017  . IR ANGIOGRAM SELECTIVE EACH ADDITIONAL VESSEL  12/08/2017  . IR ANGIOGRAM SELECTIVE EACH ADDITIONAL VESSEL  12/08/2017  . IR ANGIOGRAM VISCERAL SELECTIVE  07/25/2017  . IR ANGIOGRAM VISCERAL SELECTIVE  07/25/2017  . IR ANGIOGRAM VISCERAL SELECTIVE  12/08/2017  . IR ANGIOGRAM VISCERAL SELECTIVE  12/08/2017  . IR EMBO ARTERIAL NOT HEMORR HEMANG INC GUIDE ROADMAPPING  12/08/2017  . IR EMBO TUMOR ORGAN ISCHEMIA INFARCT INC GUIDE ROADMAPPING  07/25/2017  . IR FLUORO RM 30-60 MIN  12/08/2017  . IR GENERIC HISTORICAL  06/17/2016   IR RADIOLOGIST EVAL & MGMT 06/17/2016 Sandi Mariscal, MD [facility] INTERV RAD  . IR GENERIC HISTORICAL  07/21/2016   IR RADIOLOGIST EVAL & MGMT 07/21/2016 Sandi Mariscal, MD [facility] INTERV RAD  . IR RADIOLOGIST EVAL & MGMT  10/26/2016    . IR RADIOLOGIST EVAL & MGMT  04/05/2017  . IR RADIOLOGIST EVAL & MGMT  01/11/2017  . IR RADIOLOGIST EVAL & MGMT  06/28/2017  . IR RADIOLOGIST EVAL & MGMT  08/18/2017  . IR RADIOLOGIST EVAL & MGMT  11/17/2017  . IR US GUIDE VASC ACCESS RIGHT  07/25/2017  . IR US GUIDE VASC ACCESS RIGHT  12/08/2017  . Left rotator cuff       Discharged Condition: good  Hospital Course: Mr. Swingler is a 77 year old male with history ofcirrhosis/multifocal HCC, status post pre-Y 68 hepatic/visceral arteriogram with coil embolization of pancreaticoduodenal artery on 12/08/2017;subsequently developed tiny area of extravasation from right common femoral artery access site with associated hematoma extending from rightpelvis toright lower abdominal region. Additional manual compression applied with apparent hemostasis. He was admitted by [facility] afterwards and subsequently d/c'd home 5/25. He presented to [facility] ED yesterday (5/30) with rt pelvic/groin and left foot pain.  Left foot xray revealed nonspecific soft tissue swelling in the region of the MTP joint of the great toe sometimes seen with gout.  No osseous finding.  CT angio chest/abd pelvis revealed no PE but right inguinal hematoma and interval development of a 2.4 cm pseudoaneurysm adjacent to the right femoral artery.  Labs from yesterday included creatinine 0.87, potassium 3.9, total bilirubin 2.3, WBC 9.1, hemoglobin 9.1(9.0), platelets 86k.  Vascular surgery consulted and recommend ultrasound-guided pseudoaneurysm compression.  Vascular ultrasound of right groin revealed no evidence of pseudoaneurysm of the femoral artery after extensive interrogation of the area including  right external iliac, common femoral, superficial femoral arteries.  There was a 1.6 cm heterogeneous area with no internal vascularization in the right groin suggestive of possible hematoma.  Drs.Charlean Sanfilippo and Watts were notified of the results and decision made to discharge patient home at this time  with follow-up in IR clinic next week with Dr. Pascal Lux to discuss timing of Y90 hepatic radioembolization.  He will follow-up with his primary care physician regarding left foot swelling.  Uric acid level was normal today.  WBC today 6.3, hemoglobin 8.8 (9.1), PLTS 87K.  He will resume his usual home medications.  He was given specific instructions to avoid strenuous activity or heavy lifting  until reevaluated by Dr. Pascal Lux.    Consults: Dr. Donzetta Matters from [facility]  Significant Diagnostic Studies:  Results for orders placed or performed during the hospital encounter of 12/15/17  Comprehensive metabolic panel  Result Value Ref Range   Sodium 136 135 - 145 mmol/L   Potassium 3.9 3.5 - 5.1 mmol/L   Chloride 103 101 - 111 mmol/L   CO2 26 22 - 32 mmol/L   Glucose, Bld 95 65 - 99 mg/dL   BUN 10 6 - 20 mg/dL   Creatinine, Ser 0.87 0.61 - 1.24 mg/dL   Calcium 8.7 (L) 8.9 - 10.3 mg/dL   Total Protein 6.6 6.5 - 8.1 g/dL   Albumin 3.8 3.5 - 5.0 g/dL   AST 45 (H) 15 - 41 U/L   ALT 31 17 - 63 U/L   Alkaline Phosphatase 133 (H) 38 - 126 U/L   Total Bilirubin 2.3 (H) 0.3 - 1.2 mg/dL   GFR calc non Af Amer >60 >60 mL/min   GFR calc Af Amer >60 >60 mL/min   Anion gap 7 5 - 15  CBC with Differential  Result Value Ref Range   WBC 9.1 4.0 - 10.5 K/uL   RBC 2.73 (L) 4.22 - 5.81 MIL/uL   Hemoglobin 9.1 (L) 13.0 - 17.0 g/dL   HCT 26.7 (L) 39.0 - 52.0 %   MCV 97.8 78.0 - 100.0 fL   MCH 33.3 26.0 - 34.0 pg   MCHC 34.1 30.0 - 36.0 g/dL   RDW 14.4 11.5 - 15.5 %   Platelets 86 (L) 150 - 400 K/uL   Neutrophils Relative % 37 %   Lymphocytes Relative 29 %   Monocytes Relative 28 %   Eosinophils Relative 6 %   Basophils Relative 0 %   Neutro Abs 3.5 1.7 - 7.7 K/uL   Lymphs Abs 2.6 0.7 - 4.0 K/uL   Monocytes Absolute 2.5 (H) 0.1 - 1.0 K/uL   Eosinophils Absolute 0.5 0.0 - 0.7 K/uL   Basophils Absolute 0.0 0.0 - 0.1 K/uL   WBC Morphology MILD LEFT SHIFT (1-5% METAS, OCC MYELO, OCC BANDS)    Smear Review PLATELET  COUNT CONFIRMED BY SMEAR   Troponin I  Result Value Ref Range   Troponin I <0.03 <0.03 ng/mL  Uric acid  Result Value Ref Range   Uric Acid, Serum 6.6 4.4 - 7.6 mg/dL  CBC with Differential/Platelet  Result Value Ref Range   WBC 6.3 4.0 - 10.5 K/uL   RBC 2.63 (L) 4.22 - 5.81 MIL/uL   Hemoglobin 8.8 (L) 13.0 - 17.0 g/dL   HCT 26.1 (L) 39.0 - 52.0 %   MCV 99.2 78.0 - 100.0 fL   MCH 33.5 26.0 - 34.0 pg   MCHC 33.7 30.0 - 36.0 g/dL   RDW 14.9 11.5 - 15.5 %  Platelets 87 (L) 150 - 400 K/uL   Neutrophils Relative % 40 %   Neutro Abs 2.5 1.7 - 7.7 K/uL   Lymphocytes Relative 17 %   Lymphs Abs 1.1 0.7 - 4.0 K/uL   Monocytes Relative 36 %   Monocytes Absolute 2.3 (H) 0.1 - 1.0 K/uL   Eosinophils Relative 7 %   Eosinophils Absolute 0.5 0.0 - 0.7 K/uL   Basophils Relative 0 %   Basophils Absolute 0.0 0.0 - 0.1 K/uL  Type and screen [place]  Result Value Ref Range   ABO/RH(D) O NEG    Antibody Screen NEG    Sample Expiration      12/18/2017 Performed at [facility], [place] [address], [place], [place] [address]      Treatments: none except rt groin vascular US 12/16/17  Discharge Exam: Blood pressure (!) 129/57, pulse 74, temperature 99.5 F (37.5 C), temperature source Oral, resp. rate 17, height 5\' 5"  (1.651 m), weight 142 lb 13.7 oz (64.8 kg), SpO2 100 %. awake, alert.  Chest clear to auscultation bilaterally.  Heart with regular rate and rhythm.  Abdomen soft, positive bowel sounds, tender right lower abdominal/pelvic/suprapubic region with scattered ecchymosis; palpable bulge just superior to the groin crease with positive bruit.  Right lower extremity with plus minus PT, 2+ DP pulse, no edema; left lower extremity with plus minus left PT pulse, 2+ DP pulse, swelling of left distal foot and toes with some mild erythema.  Sensory/motor fxn intact function intact    Disposition: Discharge disposition: 01-Home or Self  Care       Discharge Instructions    Call MD for:  difficulty breathing, headache or visual disturbances   Complete by:  As directed    Call MD for:  extreme fatigue   Complete by:  As directed    Call MD for:  hives   Complete by:  As directed    Call MD for:  persistant dizziness or light-headedness   Complete by:  As directed    Call MD for:  persistant nausea and vomiting   Complete by:  As directed    Call MD for:  redness, tenderness, or signs of infection (pain, swelling, redness, odor or green/yellow discharge around incision site)   Complete by:  As directed    Call MD for:  severe uncontrolled pain   Complete by:  As directed    Call MD for:  temperature >100.4   Complete by:  As directed    Diet - low sodium heart healthy   Complete by:  As directed    Discharge instructions   Complete by:  As directed    No heavy lifting, running or strenuous activity for 1 week   Driving Restrictions   Complete by:  As directed    No driving for 24 hours   Increase activity slowly   Complete by:  As directed    Lifting restrictions   Complete by:  As directed    No heavy lifting for 1 week   May shower / Bathe   Complete by:  As directed    May walk up steps   Complete by:  As directed      Allergies as of 12/16/2017      Reactions   Erythromycin Other (See Comments)   Abd cramps      Medication List    STOP taking these medications   COLCRYS 0.6 MG tablet Generic drug:  colchicine  TAKE these medications   acetaminophen 500 MG tablet Commonly known as:  TYLENOL Take 500 mg by mouth daily as needed for mild pain.   cholecalciferol 1000 units tablet Commonly known as:  VITAMIN D Take 2,000 Units by mouth daily.   loratadine-pseudoephedrine 10-240 MG 24 hr tablet Commonly known as:  CLARITIN-D 24-hour Take 1 tablet by mouth daily as needed for allergies.   mirtazapine 15 MG tablet Commonly known as:  REMERON Take 15 mg by mouth at bedtime.    polyethylene glycol packet Commonly known as:  MIRALAX / GLYCOLAX Take 17 g by mouth 2 (two) times daily.   propranolol 20 MG tablet Commonly known as:  INDERAL Take 20 mg by mouth 2 (two) times daily.   rifaximin 550 MG Tabs tablet Commonly known as:  XIFAXAN Take 550 mg by mouth 2 (two) times daily.   senna-docusate 8.6-50 MG tablet Commonly known as:  Senokot-S Take 1 tablet by mouth at bedtime.   sodium chloride 0.65 % Soln nasal spray Commonly known as:  OCEAN Place 1 spray into both nostrils as needed for congestion.   tamsulosin 0.4 MG Caps capsule Commonly known as:  FLOMAX TAKE 1 capsule Once in the evening Orally 30 day(s)   traMADol 50 MG tablet Commonly known as:  ULTRAM Take 50 mg by mouth every 6 (six) hours as needed for moderate pain or severe pain.   vitamin B-12 100 MCG tablet Commonly known as:  CYANOCOBALAMIN Take 100 mcg by mouth daily.   vitamin C 500 MG tablet Commonly known as:  ASCORBIC ACID Take 500 mg by mouth daily.      Follow-up Information    Sandi Mariscal, MD Follow up.   Specialties:  Interventional Radiology, Radiology Why:  Radiology will call you with follow-up appointment in IR clinic with Dr. Pascal Lux; call [phone] or [phone] with any questions Contact information: [address] [place] [address] [phone]            Electronically Signed: D. Rowe Robert, PA-C 12/16/2017, 2:05 PM   I have spent less than 30 minutes discharging [employer].

## 2017-12-20 ENCOUNTER — Other Ambulatory Visit (HOSPITAL_COMMUNITY): Payer: Self-pay | Admitting: Interventional Radiology

## 2017-12-20 ENCOUNTER — Other Ambulatory Visit: Payer: Self-pay | Admitting: Student

## 2017-12-20 DIAGNOSIS — T81718A Complication of other artery following a procedure, not elsewhere classified, initial encounter: Principal | ICD-10-CM

## 2017-12-20 DIAGNOSIS — T148XXA Other injury of unspecified body region, initial encounter: Secondary | ICD-10-CM

## 2017-12-20 DIAGNOSIS — C220 Liver cell carcinoma: Secondary | ICD-10-CM

## 2017-12-20 DIAGNOSIS — I724 Aneurysm of artery of lower extremity: Secondary | ICD-10-CM

## 2017-12-21 ENCOUNTER — Ambulatory Visit
Admission: RE | Admit: 2017-12-21 | Discharge: 2017-12-21 | Disposition: A | Discharge status: Home / Self Care | Payer: Non-veteran care | Source: Ambulatory Visit | Attending: Interventional Radiology | Admitting: Interventional Radiology

## 2017-12-21 ENCOUNTER — Other Ambulatory Visit (HOSPITAL_COMMUNITY): Payer: Self-pay | Admitting: Interventional Radiology

## 2017-12-21 ENCOUNTER — Encounter: Payer: Self-pay | Admitting: Radiology

## 2017-12-21 DIAGNOSIS — I724 Aneurysm of artery of lower extremity: Secondary | ICD-10-CM

## 2017-12-21 DIAGNOSIS — T148XXA Other injury of unspecified body region, initial encounter: Secondary | ICD-10-CM

## 2017-12-21 DIAGNOSIS — I729 Aneurysm of unspecified site: Secondary | ICD-10-CM

## 2017-12-21 DIAGNOSIS — T81718A Complication of other artery following a procedure, not elsewhere classified, initial encounter: Principal | ICD-10-CM

## 2017-12-21 DIAGNOSIS — C220 Liver cell carcinoma: Secondary | ICD-10-CM

## 2017-12-21 HISTORY — PX: IR RADIOLOGIST EVAL & MGMT: IMG5224

## 2017-12-21 NOTE — Progress Notes (Signed)
Patient ID: Mike Ferguson, male   DOB: [birth year], 77 y.o.   MRN: [id number]         Chief Complaint: Hepatocellular carcinoma  Referring Physician(s): [name] (GI)  History of Present Illness: Mike Ferguson is a 77 y.o. male with past medical history significant for hypertension, gout, esophageal varices and alcoholic cirrhosis who underwent image guided hepatic microwave ablation on 06/25/2016 and a bland embolization performed on 07/25/2017 who was found to have progression of multifocal hepatocellular carcinoma for which he underwent technically successful Y 90 mapping procedure performed on 12/08/2017 prior to planned staged Y 90 radioembolization of both the dominant right and accessory right hepatic arteries.  Unfortunately, the mapping Y 90 radioembolization was complicated by development of postprocedural access site hematoma felt to be controlled with continued manual compression, however the patient was admitted to the hospital for continued management and observation.  During the admission, the patient experienced a syncopal episode ultimately felt to be secondary to a vasovagal reaction as subsequent work-up proved otherwise inconclusive.  Unfortunately, patient has experienced persistent right groin pain for which he presented back to the emergency department with CT scan of the abdomen pelvis performed 12/15/2017 demonstrating a persistent hematoma and associated small pseudoaneurysm.  Patient was evaluated by vascular surgery however Doppler evaluation of the right groin performed the following morning was negative for definitive persistent pseudoaneurysm and dedicated intervention was not performed at that time.  Given the patient's rocky postoperative course, he returned to the interventional radiology clinic today prior to planned initial radioembolization of the dominant right hepatic artery scheduled for tomorrow.  In addition to the above complicated factors, the patient has also  experienced an acute episode of gouty flare affecting primarily his left foot.  The patient is again accompanied by his wife though serves as his own historian.  The patient is expectedly disappointed with his postoperative course.  He complains continues to complain of right groin pain as well as overall fatigue and decreased energy level.        Past Medical History:  Diagnosis Date  . Arthritis    generalized-back  . Cirrhosis ([place])   . Cirrhosis, alcoholic ([place])   . Esophageal varices ([place])   . Hearing impaired person, bilateral    hearing aids bilaterally  . Hepatocellular carcinoma ([place])   . Hypertension     Past Surgical History:  Procedure Laterality Date  . HERNIA REPAIR     RIH  . IR ANGIOGRAM SELECTIVE EACH ADDITIONAL VESSEL  07/25/2017  . IR ANGIOGRAM SELECTIVE EACH ADDITIONAL VESSEL  07/25/2017  . IR ANGIOGRAM SELECTIVE EACH ADDITIONAL VESSEL  07/25/2017  . IR ANGIOGRAM SELECTIVE EACH ADDITIONAL VESSEL  07/25/2017  . IR ANGIOGRAM SELECTIVE EACH ADDITIONAL VESSEL  12/08/2017  . IR ANGIOGRAM SELECTIVE EACH ADDITIONAL VESSEL  12/08/2017  . IR ANGIOGRAM SELECTIVE EACH ADDITIONAL VESSEL  12/08/2017  . IR ANGIOGRAM SELECTIVE EACH ADDITIONAL VESSEL  12/08/2017  . IR ANGIOGRAM SELECTIVE EACH ADDITIONAL VESSEL  12/08/2017  . IR ANGIOGRAM VISCERAL SELECTIVE  07/25/2017  . IR ANGIOGRAM VISCERAL SELECTIVE  07/25/2017  . IR ANGIOGRAM VISCERAL SELECTIVE  12/08/2017  . IR ANGIOGRAM VISCERAL SELECTIVE  12/08/2017  . IR EMBO ARTERIAL NOT HEMORR HEMANG INC GUIDE ROADMAPPING  12/08/2017  . IR EMBO TUMOR ORGAN ISCHEMIA INFARCT INC GUIDE ROADMAPPING  07/25/2017  . IR FLUORO RM 30-60 MIN  12/08/2017  . IR GENERIC HISTORICAL  06/17/2016   IR RADIOLOGIST EVAL & MGMT 06/17/2016 Sandi Mariscal, MD [facility] INTERV RAD  .  IR GENERIC HISTORICAL  07/21/2016   IR RADIOLOGIST EVAL & MGMT 07/21/2016 Sandi Mariscal, MD [facility] INTERV RAD  . IR RADIOLOGIST EVAL & MGMT  10/26/2016  . IR RADIOLOGIST EVAL & MGMT  04/05/2017  . IR  RADIOLOGIST EVAL & MGMT  01/11/2017  . IR RADIOLOGIST EVAL & MGMT  06/28/2017  . IR RADIOLOGIST EVAL & MGMT  08/18/2017  . IR RADIOLOGIST EVAL & MGMT  11/17/2017  . IR RADIOLOGIST EVAL & MGMT  12/21/2017  . IR US GUIDE VASC ACCESS RIGHT  07/25/2017  . IR US GUIDE VASC ACCESS RIGHT  12/08/2017  . Left rotator cuff      Allergies: Erythromycin  Medications: Prior to Admission medications   Medication Sig Start Date End Date Taking? Authorizing Provider  acetaminophen (TYLENOL) 500 MG tablet Take 500 mg by mouth daily as needed for mild pain.    [provider]  cholecalciferol (VITAMIN D) 1000 units tablet Take 2,000 Units by mouth daily.    [provider]  loratadine-pseudoephedrine (CLARITIN-D 24-HOUR) 10-240 MG 24 hr tablet Take 1 tablet by mouth daily as needed for allergies.     [provider]  mirtazapine (REMERON) 15 MG tablet Take 15 mg by mouth at bedtime.    [provider]  polyethylene glycol (MIRALAX / GLYCOLAX) packet Take 17 g by mouth 2 (two) times daily. 12/10/17   Raiford Noble Latif, DO  propranolol (INDERAL) 20 MG tablet Take 20 mg by mouth 2 (two) times daily. 05/31/16   [provider]  rifaximin (XIFAXAN) 550 MG TABS tablet Take 550 mg by mouth 2 (two) times daily.    [provider]  senna-docusate (SENOKOT-S) 8.6-50 MG tablet Take 1 tablet by mouth at bedtime. 12/10/17   Raiford Noble Latif, DO  sodium chloride (OCEAN) 0.65 % SOLN nasal spray Place 1 spray into both nostrils as needed for congestion.    [provider]  tamsulosin (FLOMAX) 0.4 MG CAPS capsule TAKE 1 capsule Once in the evening Orally 30 day(s) 05/20/17   [provider]  traMADol (ULTRAM) 50 MG tablet Take 50 mg by mouth every 6 (six) hours as needed for moderate pain or severe pain.    [provider]  vitamin B-12 (CYANOCOBALAMIN) 100 MCG tablet Take 100 mcg by mouth daily.    [provider]  vitamin C (ASCORBIC ACID)  500 MG tablet Take 500 mg by mouth daily.    [provider]     No family history on file.  Social History   Socioeconomic History  . Marital status: Married    Spouse name: Not on file  . Number of children: Not on file  . Years of education: Not on file  . Highest education level: Not on file  Occupational History  . Not on file  Social Needs  . Financial resource strain: Not on file  . Food insecurity:    Worry: Not on file    Inability: Not on file  . Transportation needs:    Medical: Not on file    Non-medical: Not on file  Tobacco Use  . Smoking status: Former Smoker    Last attempt to quit: 06/24/1986    Years since quitting: 31.5  . Smokeless tobacco: Never Used  Substance and Sexual Activity  . Alcohol use: No    Comment: 2011 Quit- ETOH abuse  . Drug use: No  . Sexual activity: Not Currently  Lifestyle  . Physical activity:    Days per  week: Not on file    Minutes per session: Not on file  . Stress: Not on file  Relationships  . Social connections:    Talks on phone: Not on file    Gets together: Not on file    Attends religious service: Not on file    Active member of club or organization: Not on file    Attends meetings of clubs or organizations: Not on file    Relationship status: Not on file  Other Topics Concern  . Not on file  Social History Narrative  . Not on file    ECOG Status: 2 - Symptomatic, <50% confined to bed  Review of Systems: A 12 point ROS discussed and pertinent positives are indicated in the HPI above.  All other systems are negative.  Review of Systems  Vital Signs: BP (!) 179/81   Pulse (!) 54   Temp 97.6 F (36.4 C) (Oral)   Resp 16   SpO2 99%   Physical Exam   Imaging:  CT abdomen and pelvis - 12/15/2017; 12/08/2017; right groin arterial duplex - earlier today  Ct Head Wo Contrast  Result Date: 12/09/2017 CLINICAL DATA:  Syncopal episode yesterday. Dizzy. Hepatocellular carcinoma. EXAM: CT HEAD  WITHOUT CONTRAST TECHNIQUE: Contiguous axial images were obtained from the base of the skull through the vertex without intravenous contrast. COMPARISON:  None. FINDINGS: Brain: Mild white matter changes are present bilaterally. No acute or focal cortical infarct is present. Basal ganglia are intact. Insular ribbon is normal bilaterally. The brainstem and cerebellum are normal. Ventricles are of normal size. No significant extra-axial fluid collection is present. Vascular: Atherosclerotic calcifications are present within the cavernous internal carotid arteries bilaterally. There is no hyperdense vessel. Skull: Calvarium is intact. No focal lytic or blastic lesions are present. Sinuses/Orbits: The paranasal sinuses and mastoid air cells are clear. Bilateral lens replacements are present. Globes and orbits are within normal limits. IMPRESSION: 1. No acute intracranial abnormality or focal lesion to explain the patient's syncopal episode or dizziness. 2. Mild white matter changes are within normal limits for age. 3. Atherosclerosis. Electronically Signed   By: San Morelle M.D.   On: 12/09/2017 13:26   Ct Angio Chest Pe W And/or Wo Contrast  Result Date: 12/15/2017 CLINICAL DATA:  Pt wife states the pt's doctor called and informed them he may have a blood clot. Pt had D-dimer of 4.54. Pt states his left leg hurts. Pt's wife noticed the pt has been wheezing intermittently since coming home from hospital 5 days ago. EXAM: CT ANGIOGRAPHY CHEST CT ABDOMEN AND PELVIS WITH CONTRAST TECHNIQUE: Multidetector CT imaging of the chest was performed using the standard protocol during bolus administration of intravenous contrast. Multiplanar CT image reconstructions and MIPs were obtained to evaluate the vascular anatomy. Multidetector CT imaging of the abdomen and pelvis was performed using the standard protocol during bolus administration of intravenous contrast. CONTRAST:  <See Chart> ISOVUE-300 IOPAMIDOL  (ISOVUE-300) INJECTION 61%, 189m ISOVUE-370 IOPAMIDOL (ISOVUE-370) INJECTION 76% COMPARISON:  12/08/2017 and multiple prior studies including 2011 FINDINGS: CTA CHEST FINDINGS Cardiovascular: The pulmonary arteries are well opacified. There is no evidence for acute pulmonary embolus. Coronary artery calcifications are present. There is atherosclerotic calcification of the thoracic aorta not associated with aneurysm. Mediastinum/Nodes: No enlarged mediastinal, hilar, or axillary lymph nodes. Thyroid gland, trachea, and esophagus demonstrate no significant findings. Lungs/Pleura: Mild patient motion artifact. No suspicious pulmonary nodules. No pleural effusions or consolidations. Airways are patent. Musculoskeletal: No chest wall abnormality. No acute  or significant osseous findings. Review of the MIP images confirms the above findings. CT ABDOMEN and PELVIS FINDINGS Hepatobiliary: Cirrhotic contour of the liver. Low-attenuation again identified in the anterior segmental RIGHT hepatic lobe, without associated hyperemia best seen on the previous exam. Gallbladder is present. Pancreas: Unremarkable. No pancreatic ductal dilatation or surrounding inflammatory changes. Spleen: UPPER normal in size.  No focal abnormality. Adrenals/Urinary Tract: No urinary tract obstruction. Urinary bladder is unremarkable. Stomach/Bowel: The stomach and small bowel loops are normal in appearance. The appendix is well seen and has a normal appearance. Colon is normal in appearance. Vascular/Lymphatic: In the RIGHT inguinal region there is a moderate hematoma following previous arterial access. Adjacent to the femoral artery, there is a contrast-enhancing bilobed circumscribed mass measuring 1.3 x 2.4 centimeters and consistent with a pseudoaneurysm. Reproductive: No significant adenopathy. Other: Prostatic calcifications are present. Musculoskeletal: No acute osseous abnormalities. Benign sclerotic lesion within the RIGHT posterior iliac  wing. Review of the MIP images confirms the above findings. IMPRESSION: 1. Technically adequate exam showing no acute pulmonary embolus. 2. Atherosclerosis of the coronary arteries an aorta. Aortic Atherosclerosis (ICD10-I70.0). 3. Cirrhosis and RIGHT hepatic lobe lesion. 4. RIGHT inguinal hematoma and interval development of 2.4 cm pseudoaneurysm adjacent to the RIGHT femoral artery. These results were called by telephone at the time of interpretation on 12/15/2017 at 7:13 pm to Dr. Davonna Belling , who verbally acknowledged these results. Electronically Signed   By: Nolon Nations M.D.   On: 12/15/2017 19:14   Nm Liver Img Spect  Result Date: 12/08/2017 CLINICAL DATA:  Pre yttrium 90 radioembolization evaluation. EXAM: NUCLEAR MEDICINE LIVER SCAN; ULTRASOUND MISCELLANEOUS SOFT TISSUE TECHNIQUE: Abdominal images were obtained in multiple projections after intrahepatic arterial injection of radiopharmaceutical. SPECT imaging was performed. Lung shunt calculation was performed. RADIOPHARMACEUTICALS:  4.38mllicurie MAA TECHNETIUM TO 25M ALBUMIN AGGREGATED COMPARISON:  MRI 06/28/2017, angiography 12/08/2017 FINDINGS: The injected microaggregated albumin localizes within the RIGHT hepatic lobe. Small focus of activity in the porta hepatis. No evidence of activity within the stomach, duodenum, or bowel. Calculated shunt fraction to the lungs equals 8.0%. IMPRESSION: 1. Injected MAA tracer activity localizes to the RIGHT hepatic lobe. 2. Small focus of radiotracer activity within the porta hepatis. Recommend catheter angiography evaluation at time of treatment. 3. Lung shunt fraction equals 8.0%. Findings conveyed toJOHN Jovanie Verge on 12/08/2017  at16:08. Electronically Signed   By: SSuzy BouchardM.D.   On: 12/08/2017 16:09   Ct Abdomen Pelvis W Contrast  Result Date: 12/15/2017 CLINICAL DATA:  Pt wife states the pt's doctor called and informed them he may have a blood clot. Pt had D-dimer of 4.54. Pt states his  left leg hurts. Pt's wife noticed the pt has been wheezing intermittently since coming home from hospital 5 days ago. EXAM: CT ANGIOGRAPHY CHEST CT ABDOMEN AND PELVIS WITH CONTRAST TECHNIQUE: Multidetector CT imaging of the chest was performed using the standard protocol during bolus administration of intravenous contrast. Multiplanar CT image reconstructions and MIPs were obtained to evaluate the vascular anatomy. Multidetector CT imaging of the abdomen and pelvis was performed using the standard protocol during bolus administration of intravenous contrast. CONTRAST:  <See Chart> ISOVUE-300 IOPAMIDOL (ISOVUE-300) INJECTION 61%, 1048mISOVUE-370 IOPAMIDOL (ISOVUE-370) INJECTION 76% COMPARISON:  12/08/2017 and multiple prior studies including 2011 FINDINGS: CTA CHEST FINDINGS Cardiovascular: The pulmonary arteries are well opacified. There is no evidence for acute pulmonary embolus. Coronary artery calcifications are present. There is atherosclerotic calcification of the thoracic aorta not associated with aneurysm. Mediastinum/Nodes: No enlarged  mediastinal, hilar, or axillary lymph nodes. Thyroid gland, trachea, and esophagus demonstrate no significant findings. Lungs/Pleura: Mild patient motion artifact. No suspicious pulmonary nodules. No pleural effusions or consolidations. Airways are patent. Musculoskeletal: No chest wall abnormality. No acute or significant osseous findings. Review of the MIP images confirms the above findings. CT ABDOMEN and PELVIS FINDINGS Hepatobiliary: Cirrhotic contour of the liver. Low-attenuation again identified in the anterior segmental RIGHT hepatic lobe, without associated hyperemia best seen on the previous exam. Gallbladder is present. Pancreas: Unremarkable. No pancreatic ductal dilatation or surrounding inflammatory changes. Spleen: UPPER normal in size.  No focal abnormality. Adrenals/Urinary Tract: No urinary tract obstruction. Urinary bladder is unremarkable. Stomach/Bowel:  The stomach and small bowel loops are normal in appearance. The appendix is well seen and has a normal appearance. Colon is normal in appearance. Vascular/Lymphatic: In the RIGHT inguinal region there is a moderate hematoma following previous arterial access. Adjacent to the femoral artery, there is a contrast-enhancing bilobed circumscribed mass measuring 1.3 x 2.4 centimeters and consistent with a pseudoaneurysm. Reproductive: No significant adenopathy. Other: Prostatic calcifications are present. Musculoskeletal: No acute osseous abnormalities. Benign sclerotic lesion within the RIGHT posterior iliac wing. Review of the MIP images confirms the above findings. IMPRESSION: 1. Technically adequate exam showing no acute pulmonary embolus. 2. Atherosclerosis of the coronary arteries an aorta. Aortic Atherosclerosis (ICD10-I70.0). 3. Cirrhosis and RIGHT hepatic lobe lesion. 4. RIGHT inguinal hematoma and interval development of 2.4 cm pseudoaneurysm adjacent to the RIGHT femoral artery. These results were called by telephone at the time of interpretation on 12/15/2017 at 7:13 pm to Dr. Davonna Belling , who verbally acknowledged these results. Electronically Signed   By: Nolon Nations M.D.   On: 12/15/2017 19:14   Ir Angiogram Visceral Selective  Result Date: 12/08/2017 INDICATION: History of multifocal hepatocellular carcinoma. Patient presents today for Y 90 mapping procedure. Please refer to formal consultation in the epic EMR dated 11/17/2017 for additional details. EXAM: 1. ULTRASOUND GUIDANCE FOR ARTERIAL ACCESS 2. CELIAC AND SUPERIOR MESENTERIC ARTERIOGRAM (1st ORDER) 3. SELECTIVE COMMON AND PROPER HEPATIC ARTERIOGRAMS 4. SELECTIVE ARTERIOGRAM OF ACCESSORY PANCREATICODUODENAL ARTERY AND PERCUTANEOUS COIL EMBOLIZATION 5. SELECTIVE ARTERIOGRAM OF ACCESSORY RIGHT HEPATIC ARTERY AND ADMINISTRATION OF Tc75mMAA 6. SELECTIVE ARTERIOGRAM OF THE RIGHT HEPATIC ARTERY AND ADMINISTRATION OF Tc932mAA COMPARISON:   Hepatic bland embolization - 07/25/2017; abdominal MRI - 11/01/2017; CTA of the abdomen pelvis - 07/07/2017 MEDICATIONS: None RADIOPHARMACEUTICALS:  A total of 5 mCi Tc9947mA was administered, 2 mCi via an accessory right hepatic artery and 3 mCi via the right hepatic artery. CONTRAST:  75 cc Isovue 300 ANESTHESIA/SEDATION: Moderate (conscious) sedation was employed during this procedure. A total of Versed 3 mg and Fentanyl 150 mcg was administered intravenously. Moderate Sedation Time: 59 minutes. The patient's level of consciousness and vital signs were monitored continuously by radiology nursing throughout the procedure under my direct supervision. FLUOROSCOPY TIME:  14 minutes, 48 seconds (584 mGy) ACCESS: Right common femoral artery; hemostasis achieved with manual compression. COMPLICATIONS: None immediate. TECHNIQUE: Informed written consent was obtained from the patient after a discussion of the risks, benefits and alternatives to treatment. Questions regarding the procedure were encouraged and answered. A timeout was performed prior to the initiation of the procedure. The right groin was prepped and draped in the usual sterile fashion, and a sterile drape was applied covering the operative field. Maximum barrier sterile technique with sterile gowns and gloves were used for the procedure. A timeout was performed prior to the initiation of  the procedure. Local anesthesia was provided with 1% lidocaine. The right femoral head was marked fluoroscopically. Under ultrasound guidance, the right common femoral artery was accessed with a micropuncture kit after the overlying soft tissues were anesthetized with 1% lidocaine. An ultrasound image was saved for documentation purposes. The micropuncture sheath was exchanged for a 5 [place] vascular sheath over a Bentson wire. A closure arteriogram was performed through the side of the sheath confirming access within the right common femoral artery. Over a Bentson wire, a  Mickelson catheter was advanced to the level of the thoracic aorta where it was back bled and flushed. The catheter was then utilized to select the superior mesenteric artery and a superior mesenteric arteriogram with delayed portal venous imaging was performed. The Mickelson catheter was then advanced cranially and utilized to select the celiac artery and a celiac arteriogram was performed. Utilizing a fathom 14 microwire, a regular renegade micro catheter was utilized to select common and proper hepatic arteries and selective arteriograms were performed. The Renegade microcatheter was utilized to select an accessory pancreaticoduodenal artery noted to arise subjacent to the left hepatic artery. Selective arteriogram was performed confirming appropriate positioning and the vessel was subsequent percutaneously coil embolized with 3 overlapping 2 mm x 5 mm partial coils to near the vessel's origin. The microcatheter was retracted into the proper hepatic artery and a post embolization proper hepatic arteriogram was performed Next, the microcatheter was utilized to select the accessory right hepatic artery and accessory right hepatic arteriogram was performed. From this location, 2 mCi of technetium 99 MAA was administered. Next the microcatheter was advanced into the right hepatic artery and a selective right hepatic arteriogram was performed. From the location proximal to the vessels bifurcation, 3 mCi of technetium 99 MAA was administered. At this point, the procedure was terminated. All wires and catheters and sheaths were removed from the patient. Hemostasis was achieved at the right groin and access site with manual compression. A dressing was placed. The patient tolerated procedure well without immediate postprocedural complication. The patient was escorted to nuclear medicine department for planar imaging. FINDINGS: Selective superior mesenteric arteriogram was again negative for accessory or replaced hepatic  arterial supply. Late portal venous phase imaging demonstrates patency of the main, right and left portal veins. Celiac arteriogram re-demonstrates an accessory right hepatic artery supplying the medial aspect of the right lobe of the liver. 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 Successful split dose administration of a total of 5 mCi of technetium 78mMAA via the right hepatic and accessory right hepatic arteries. IMPRESSION: 1. Successful pre Y-90 arteriogram with percutaneous coil embolization of the pancreaticoduodenal artery. 2. Successful split dose administration of a total of 5 mCi of technetium 92mAA via the right hepatic and accessory right hepatic arteries. Awaiting results of nuclear medicine liver scan. PLAN: Pending the results of the nuclear medicine liver scan, the patient will return Y90 radioembolization the right hepatic artery. Pending the patient's recovery from this initial treatment session, the patient will return for radioembolization of the accessory right hepatic artery. Electronically Signed   By: JoSandi Mariscal.D.   On: 12/08/2017 13:45   Ir Angiogram Visceral Selective  Result Date: 12/08/2017 INDICATION: History of multifocal hepatocellular carcinoma. Patient presents today for Y 90 mapping procedure. Please refer to formal consultation in the epic EMR dated 11/17/2017 for additional  details. EXAM: 1. ULTRASOUND GUIDANCE FOR ARTERIAL ACCESS 2. CELIAC AND SUPERIOR MESENTERIC ARTERIOGRAM (1st ORDER) 3. SELECTIVE COMMON AND PROPER HEPATIC ARTERIOGRAMS 4. SELECTIVE ARTERIOGRAM OF ACCESSORY PANCREATICODUODENAL ARTERY AND PERCUTANEOUS COIL EMBOLIZATION 5. SELECTIVE ARTERIOGRAM OF ACCESSORY RIGHT HEPATIC ARTERY AND ADMINISTRATION OF Tc54mMAA 6. SELECTIVE ARTERIOGRAM OF THE RIGHT HEPATIC ARTERY AND ADMINISTRATION OF Tc926mAA  COMPARISON:  Hepatic bland embolization - 07/25/2017; abdominal MRI - 11/01/2017; CTA of the abdomen pelvis - 07/07/2017 MEDICATIONS: None RADIOPHARMACEUTICALS:  A total of 5 mCi Tc995mA was administered, 2 mCi via an accessory right hepatic artery and 3 mCi via the right hepatic artery. CONTRAST:  75 cc Isovue 300 ANESTHESIA/SEDATION: Moderate (conscious) sedation was employed during this procedure. A total of Versed 3 mg and Fentanyl 150 mcg was administered intravenously. Moderate Sedation Time: 59 minutes. The patient's level of consciousness and vital signs were monitored continuously by radiology nursing throughout the procedure under my direct supervision. FLUOROSCOPY TIME:  14 minutes, 48 seconds (584 mGy) ACCESS: Right common femoral artery; hemostasis achieved with manual compression. COMPLICATIONS: None immediate. TECHNIQUE: Informed written consent was obtained from the patient after a discussion of the risks, benefits and alternatives to treatment. Questions regarding the procedure were encouraged and answered. A timeout was performed prior to the initiation of the procedure. The right groin was prepped and draped in the usual sterile fashion, and a sterile drape was applied covering the operative field. Maximum barrier sterile technique with sterile gowns and gloves were used for the procedure. A timeout was performed prior to the initiation of the procedure. Local anesthesia was provided with 1% lidocaine. The right femoral head was marked fluoroscopically. Under ultrasound guidance, the right common femoral artery was accessed with a micropuncture kit after the overlying soft tissues were anesthetized with 1% lidocaine. An ultrasound image was saved for documentation purposes. The micropuncture sheath was exchanged for a 5 Fre[place] sheath over a Bentson wire. A closure arteriogram was performed through the side of the sheath confirming access within the right common femoral artery. Over a  Bentson wire, a Mickelson catheter was advanced to the level of the thoracic aorta where it was back bled and flushed. The catheter was then utilized to select the superior mesenteric artery and a superior mesenteric arteriogram with delayed portal venous imaging was performed. The Mickelson catheter was then advanced cranially and utilized to select the celiac artery and a celiac arteriogram was performed. Utilizing a fathom 14 microwire, a regular renegade micro catheter was utilized to select common and proper hepatic arteries and selective arteriograms were performed. The Renegade microcatheter was utilized to select an accessory pancreaticoduodenal artery noted to arise subjacent to the left hepatic artery. Selective arteriogram was performed confirming appropriate positioning and the vessel was subsequent percutaneously coil embolized with 3 overlapping 2 mm x 5 mm partial coils to near the vessel's origin. The microcatheter was retracted into the proper hepatic artery and a post embolization proper hepatic arteriogram was performed Next, the microcatheter was utilized to select the accessory right hepatic artery and accessory right hepatic arteriogram was performed. From this location, 2 mCi of technetium 99 MAA was administered. Next the microcatheter was advanced into the right hepatic artery and a selective right hepatic arteriogram was performed. From the location proximal to the vessels bifurcation, 3 mCi of technetium 99 MAA was administered. At this point, the procedure was terminated. All wires and catheters and sheaths were removed from the patient. Hemostasis was achieved at the right groin and access  site with manual compression. A dressing was placed. The patient tolerated procedure well without immediate postprocedural complication. The patient was escorted to nuclear medicine department for planar imaging. FINDINGS: Selective superior mesenteric arteriogram was again negative for accessory or  replaced hepatic arterial supply. Late portal venous phase imaging demonstrates patency of the main, right and left portal veins. Celiac arteriogram re-demonstrates an accessory right hepatic artery supplying the medial aspect of the right lobe of the liver. 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 Successful split dose administration of a total of 5 mCi of technetium 44mMAA via the right hepatic and accessory right hepatic arteries. IMPRESSION: 1. Successful pre Y-90 arteriogram with percutaneous coil embolization of the pancreaticoduodenal artery. 2. Successful split dose administration of a total of 5 mCi of technetium 921mAA via the right hepatic and accessory right hepatic arteries. Awaiting results of nuclear medicine liver scan. PLAN: Pending the results of the nuclear medicine liver scan, the patient will return Y90 radioembolization the right hepatic artery. Pending the patient's recovery from this initial treatment session, the patient will return for radioembolization of the accessory right hepatic artery. Electronically Signed   By: JoSandi Mariscal.D.   On: 12/08/2017 13:45   Ir Angiogram Selective Each Additional Vessel  Result Date: 12/08/2017 INDICATION: History of multifocal hepatocellular carcinoma. Patient presents today for Y 90 mapping procedure. Please refer to formal consultation in the epic EMR dated 11/17/2017 for additional details. EXAM: 1. ULTRASOUND GUIDANCE FOR ARTERIAL ACCESS 2. CELIAC AND SUPERIOR MESENTERIC ARTERIOGRAM (1st ORDER) 3. SELECTIVE COMMON AND PROPER HEPATIC ARTERIOGRAMS 4. SELECTIVE ARTERIOGRAM OF ACCESSORY PANCREATICODUODENAL ARTERY AND PERCUTANEOUS COIL EMBOLIZATION 5. SELECTIVE ARTERIOGRAM OF ACCESSORY RIGHT HEPATIC ARTERY AND ADMINISTRATION OF [id number] 6. SELECTIVE ARTERIOGRAM OF THE RIGHT HEPATIC ARTERY AND  ADMINISTRATION OF Tc99m24m COMPARISON:  Hepatic bland embolization - 07/25/2017; abdominal MRI - 11/01/2017; CTA of the abdomen pelvis - 07/07/2017 MEDICATIONS: None RADIOPHARMACEUTICALS:  A total of 5 mCi Tc99m 6mwas administered, 2 mCi via an accessory right hepatic artery and 3 mCi via the right hepatic artery. CONTRAST:  75 cc Isovue 300 ANESTHESIA/SEDATION: Moderate (conscious) sedation was employed during this procedure. A total of Versed 3 mg and Fentanyl 150 mcg was administered intravenously. Moderate Sedation Time: 59 minutes. The patient's level of consciousness and vital signs were monitored continuously by radiology nursing throughout the procedure under my direct supervision. FLUOROSCOPY TIME:  14 minutes, 48 seconds (584 mGy) ACCESS: Right common femoral artery; hemostasis achieved with manual compression. COMPLICATIONS: None immediate. TECHNIQUE: Informed written consent was obtained from the patient after a discussion of the risks, benefits and alternatives to treatment. Questions regarding the procedure were encouraged and answered. A timeout was performed prior to the initiation of the procedure. The right groin was prepped and draped in the usual sterile fashion, and a sterile drape was applied covering the operative field. Maximum barrier sterile technique with sterile gowns and gloves were used for the procedure. A timeout was performed prior to the initiation of the procedure. Local anesthesia was provided with 1% lidocaine. The right femoral head was marked fluoroscopically. Under ultrasound guidance, the right common femoral artery was accessed with a micropuncture kit after the overlying soft tissues were anesthetized with 1% lidocaine. An ultrasound image was saved for documentation purposes. The micropuncture sheath was exchanged for a 5 [place]ren[place] sheath over a Bentson  wire. A closure arteriogram was performed through the side of the sheath confirming access within the right  common femoral artery. Over a Bentson wire, a Mickelson catheter was advanced to the level of the thoracic aorta where it was back bled and flushed. The catheter was then utilized to select the superior mesenteric artery and a superior mesenteric arteriogram with delayed portal venous imaging was performed. The Mickelson catheter was then advanced cranially and utilized to select the celiac artery and a celiac arteriogram was performed. Utilizing a fathom 14 microwire, a regular renegade micro catheter was utilized to select common and proper hepatic arteries and selective arteriograms were performed. The Renegade microcatheter was utilized to select an accessory pancreaticoduodenal artery noted to arise subjacent to the left hepatic artery. Selective arteriogram was performed confirming appropriate positioning and the vessel was subsequent percutaneously coil embolized with 3 overlapping 2 mm x 5 mm partial coils to near the vessel's origin. The microcatheter was retracted into the proper hepatic artery and a post embolization proper hepatic arteriogram was performed Next, the microcatheter was utilized to select the accessory right hepatic artery and accessory right hepatic arteriogram was performed. From this location, 2 mCi of technetium 99 MAA was administered. Next the microcatheter was advanced into the right hepatic artery and a selective right hepatic arteriogram was performed. From the location proximal to the vessels bifurcation, 3 mCi of technetium 99 MAA was administered. At this point, the procedure was terminated. All wires and catheters and sheaths were removed from the patient. Hemostasis was achieved at the right groin and access site with manual compression. A dressing was placed. The patient tolerated procedure well without immediate postprocedural complication. The patient was escorted to nuclear medicine department for planar imaging. FINDINGS: Selective superior mesenteric arteriogram was  again negative for accessory or replaced hepatic arterial supply. Late portal venous phase imaging demonstrates patency of the main, right and left portal veins. Celiac arteriogram re-demonstrates an accessory right hepatic artery supplying the medial aspect of the right lobe of the liver. 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 Successful split dose administration of a total of 5 mCi of technetium 35mMAA via the right hepatic and accessory right hepatic arteries. IMPRESSION: 1. Successful pre Y-90 arteriogram with percutaneous coil embolization of the pancreaticoduodenal artery. 2. Successful split dose administration of a total of 5 mCi of technetium 928mAA via the right hepatic and accessory right hepatic arteries. Awaiting results of nuclear medicine liver scan. PLAN: Pending the results of the nuclear medicine liver scan, the patient will return Y90 radioembolization the right hepatic artery. Pending the patient's recovery from this initial treatment session, the patient will return for radioembolization of the accessory right hepatic artery. Electronically Signed   By: JoSandi Mariscal.D.   On: 12/08/2017 13:45   Ir Angiogram Selective Each Additional Vessel  Result Date: 12/08/2017 INDICATION: History of multifocal hepatocellular carcinoma. Patient presents today for Y 90 mapping procedure. Please refer to formal consultation in the epic EMR dated 11/17/2017 for additional details. EXAM: 1. ULTRASOUND GUIDANCE FOR ARTERIAL ACCESS 2. CELIAC AND SUPERIOR MESENTERIC ARTERIOGRAM (1st ORDER) 3. SELECTIVE COMMON AND PROPER HEPATIC ARTERIOGRAMS 4. SELECTIVE ARTERIOGRAM OF ACCESSORY PANCREATICODUODENAL ARTERY AND PERCUTANEOUS COIL EMBOLIZATION 5. SELECTIVE ARTERIOGRAM OF ACCESSORY RIGHT HEPATIC ARTERY AND ADMINISTRATION OF Tc9915mA 6. SELECTIVE ARTERIOGRAM OF  THE RIGHT HEPATIC ARTERY AND ADMINISTRATION OF Tc99m42m  MAA COMPARISON:  Hepatic bland embolization - 07/25/2017; abdominal MRI - 11/01/2017; CTA of the abdomen pelvis - 07/07/2017 MEDICATIONS: None RADIOPHARMACEUTICALS:  A total of 5 mCi Tc42mMAA was administered, 2 mCi via an accessory right hepatic artery and 3 mCi via the right hepatic artery. CONTRAST:  75 cc Isovue 300 ANESTHESIA/SEDATION: Moderate (conscious) sedation was employed during this procedure. A total of Versed 3 mg and Fentanyl 150 mcg was administered intravenously. Moderate Sedation Time: 59 minutes. The patient's level of consciousness and vital signs were monitored continuously by radiology nursing throughout the procedure under my direct supervision. FLUOROSCOPY TIME:  14 minutes, 48 seconds (584 mGy) ACCESS: Right common femoral artery; hemostasis achieved with manual compression. COMPLICATIONS: None immediate. TECHNIQUE: Informed written consent was obtained from the patient after a discussion of the risks, benefits and alternatives to treatment. Questions regarding the procedure were encouraged and answered. A timeout was performed prior to the initiation of the procedure. The right groin was prepped and draped in the usual sterile fashion, and a sterile drape was applied covering the operative field. Maximum barrier sterile technique with sterile gowns and gloves were used for the procedure. A timeout was performed prior to the initiation of the procedure. Local anesthesia was provided with 1% lidocaine. The right femoral head was marked fluoroscopically. Under ultrasound guidance, the right common femoral artery was accessed with a micropuncture kit after the overlying soft tissues were anesthetized with 1% lidocaine. An ultrasound image was saved for documentation purposes. The micropuncture sheath was exchanged for a 5 [place] sheath over a Bentson wire. A closure arteriogram was performed through the side of the sheath confirming  access within the right common femoral artery. Over a Bentson wire, a Mickelson catheter was advanced to the level of the thoracic aorta where it was back bled and flushed. The catheter was then utilized to select the superior mesenteric artery and a superior mesenteric arteriogram with delayed portal venous imaging was performed. The Mickelson catheter was then advanced cranially and utilized to select the celiac artery and a celiac arteriogram was performed. Utilizing a fathom 14 microwire, a regular renegade micro catheter was utilized to select common and proper hepatic arteries and selective arteriograms were performed. The Renegade microcatheter was utilized to select an accessory pancreaticoduodenal artery noted to arise subjacent to the left hepatic artery. Selective arteriogram was performed confirming appropriate positioning and the vessel was subsequent percutaneously coil embolized with 3 overlapping 2 mm x 5 mm partial coils to near the vessel's origin. The microcatheter was retracted into the proper hepatic artery and a post embolization proper hepatic arteriogram was performed Next, the microcatheter was utilized to select the accessory right hepatic artery and accessory right hepatic arteriogram was performed. From this location, 2 mCi of technetium 99 MAA was administered. Next the microcatheter was advanced into the right hepatic artery and a selective right hepatic arteriogram was performed. From the location proximal to the vessels bifurcation, 3 mCi of technetium 99 MAA was administered. At this point, the procedure was terminated. All wires and catheters and sheaths were removed from the patient. Hemostasis was achieved at the right groin and access site with manual compression. A dressing was placed. The patient tolerated procedure well without immediate postprocedural complication. The patient was escorted to nuclear medicine department for planar imaging. FINDINGS: Selective superior  mesenteric arteriogram was again negative for accessory or replaced hepatic arterial supply. Late portal venous phase imaging demonstrates patency of the main, right and left portal veins. Celiac  arteriogram re-demonstrates an accessory right hepatic artery supplying the medial aspect of the right lobe of the liver. 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 Successful split dose administration of a total of 5 mCi of technetium 37mMAA via the right hepatic and accessory right hepatic arteries. IMPRESSION: 1. Successful pre Y-90 arteriogram with percutaneous coil embolization of the pancreaticoduodenal artery. 2. Successful split dose administration of a total of 5 mCi of technetium 969mAA via the right hepatic and accessory right hepatic arteries. Awaiting results of nuclear medicine liver scan. PLAN: Pending the results of the nuclear medicine liver scan, the patient will return Y90 radioembolization the right hepatic artery. Pending the patient's recovery from this initial treatment session, the patient will return for radioembolization of the accessory right hepatic artery. Electronically Signed   By: JoSandi Mariscal.D.   On: 12/08/2017 13:45   Ir Angiogram Selective Each Additional Vessel  Result Date: 12/08/2017 INDICATION: History of multifocal hepatocellular carcinoma. Patient presents today for Y 90 mapping procedure. Please refer to formal consultation in the epic EMR dated 11/17/2017 for additional details. EXAM: 1. ULTRASOUND GUIDANCE FOR ARTERIAL ACCESS 2. CELIAC AND SUPERIOR MESENTERIC ARTERIOGRAM (1st ORDER) 3. SELECTIVE COMMON AND PROPER HEPATIC ARTERIOGRAMS 4. SELECTIVE ARTERIOGRAM OF ACCESSORY PANCREATICODUODENAL ARTERY AND PERCUTANEOUS COIL EMBOLIZATION 5. SELECTIVE ARTERIOGRAM OF ACCESSORY RIGHT HEPATIC ARTERY AND ADMINISTRATION OF [id number] 6.  SELECTIVE ARTERIOGRAM OF THE RIGHT HEPATIC ARTERY AND ADMINISTRATION OF Tc99m34m COMPARISON:  Hepatic bland embolization - 07/25/2017; abdominal MRI - 11/01/2017; CTA of the abdomen pelvis - 07/07/2017 MEDICATIONS: None RADIOPHARMACEUTICALS:  A total of 5 mCi Tc99m 55mwas administered, 2 mCi via an accessory right hepatic artery and 3 mCi via the right hepatic artery. CONTRAST:  75 cc Isovue 300 ANESTHESIA/SEDATION: Moderate (conscious) sedation was employed during this procedure. A total of Versed 3 mg and Fentanyl 150 mcg was administered intravenously. Moderate Sedation Time: 59 minutes. The patient's level of consciousness and vital signs were monitored continuously by radiology nursing throughout the procedure under my direct supervision. FLUOROSCOPY TIME:  14 minutes, 48 seconds (584 mGy) ACCESS: Right common femoral artery; hemostasis achieved with manual compression. COMPLICATIONS: None immediate. TECHNIQUE: Informed written consent was obtained from the patient after a discussion of the risks, benefits and alternatives to treatment. Questions regarding the procedure were encouraged and answered. A timeout was performed prior to the initiation of the procedure. The right groin was prepped and draped in the usual sterile fashion, and a sterile drape was applied covering the operative field. Maximum barrier sterile technique with sterile gowns and gloves were used for the procedure. A timeout was performed prior to the initiation of the procedure. Local anesthesia was provided with 1% lidocaine. The right femoral head was marked fluoroscopically. Under ultrasound guidance, the right common femoral artery was accessed with a micropuncture kit after the overlying soft tissues were anesthetized with 1% lidocaine. An ultrasound image was saved for documentation purposes. The micropuncture sheath was exchanged for a 5 [place]renc[place] sheath over a Bentson wire. A closure arteriogram was performed through the side  of the sheath confirming access within the right common femoral artery. Over a Bentson wire, a Mickelson catheter was advanced to the level of the thoracic aorta where it was back bled and flushed. The catheter was then utilized to select the superior mesenteric artery and a superior mesenteric arteriogram  with delayed portal venous imaging was performed. The Mickelson catheter was then advanced cranially and utilized to select the celiac artery and a celiac arteriogram was performed. Utilizing a fathom 14 microwire, a regular renegade micro catheter was utilized to select common and proper hepatic arteries and selective arteriograms were performed. The Renegade microcatheter was utilized to select an accessory pancreaticoduodenal artery noted to arise subjacent to the left hepatic artery. Selective arteriogram was performed confirming appropriate positioning and the vessel was subsequent percutaneously coil embolized with 3 overlapping 2 mm x 5 mm partial coils to near the vessel's origin. The microcatheter was retracted into the proper hepatic artery and a post embolization proper hepatic arteriogram was performed Next, the microcatheter was utilized to select the accessory right hepatic artery and accessory right hepatic arteriogram was performed. From this location, 2 mCi of technetium 99 MAA was administered. Next the microcatheter was advanced into the right hepatic artery and a selective right hepatic arteriogram was performed. From the location proximal to the vessels bifurcation, 3 mCi of technetium 99 MAA was administered. At this point, the procedure was terminated. All wires and catheters and sheaths were removed from the patient. Hemostasis was achieved at the right groin and access site with manual compression. A dressing was placed. The patient tolerated procedure well without immediate postprocedural complication. The patient was escorted to nuclear medicine department for planar imaging. FINDINGS:  Selective superior mesenteric arteriogram was again negative for accessory or replaced hepatic arterial supply. Late portal venous phase imaging demonstrates patency of the main, right and left portal veins. Celiac arteriogram re-demonstrates an accessory right hepatic artery supplying the medial aspect of the right lobe of the liver. 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 Successful split dose administration of a total of 5 mCi of technetium 33mMAA via the right hepatic and accessory right hepatic arteries. IMPRESSION: 1. Successful pre Y-90 arteriogram with percutaneous coil embolization of the pancreaticoduodenal artery. 2. Successful split dose administration of a total of 5 mCi of technetium 986mAA via the right hepatic and accessory right hepatic arteries. Awaiting results of nuclear medicine liver scan. PLAN: Pending the results of the nuclear medicine liver scan, the patient will return Y90 radioembolization the right hepatic artery. Pending the patient's recovery from this initial treatment session, the patient will return for radioembolization of the accessory right hepatic artery. Electronically Signed   By: JoSandi Mariscal.D.   On: 12/08/2017 13:45   Ir Angiogram Selective Each Additional Vessel  Result Date: 12/08/2017 INDICATION: History of multifocal hepatocellular carcinoma. Patient presents today for Y 90 mapping procedure. Please refer to formal consultation in the epic EMR dated 11/17/2017 for additional details. EXAM: 1. ULTRASOUND GUIDANCE FOR ARTERIAL ACCESS 2. CELIAC AND SUPERIOR MESENTERIC ARTERIOGRAM (1st ORDER) 3. SELECTIVE COMMON AND PROPER HEPATIC ARTERIOGRAMS 4. SELECTIVE ARTERIOGRAM OF ACCESSORY PANCREATICODUODENAL ARTERY AND PERCUTANEOUS COIL EMBOLIZATION 5. SELECTIVE ARTERIOGRAM OF ACCESSORY RIGHT HEPATIC ARTERY AND  ADMINISTRATION OF Tc994mA 6. SELECTIVE ARTERIOGRAM OF THE RIGHT HEPATIC ARTERY AND ADMINISTRATION OF Tc99m60m COMPARISON:  Hepatic bland embolization - 07/25/2017; abdominal MRI - 11/01/2017; CTA of the abdomen pelvis - 07/07/2017 MEDICATIONS: None RADIOPHARMACEUTICALS:  A total of 5 mCi Tc99m 39mwas administered, 2 mCi via an accessory right hepatic artery and 3 mCi via the right hepatic artery. CONTRAST:  75 cc Isovue 300 ANESTHESIA/SEDATION: Moderate (conscious) sedation was employed  during this procedure. A total of Versed 3 mg and Fentanyl 150 mcg was administered intravenously. Moderate Sedation Time: 59 minutes. The patient's level of consciousness and vital signs were monitored continuously by radiology nursing throughout the procedure under my direct supervision. FLUOROSCOPY TIME:  14 minutes, 48 seconds (584 mGy) ACCESS: Right common femoral artery; hemostasis achieved with manual compression. COMPLICATIONS: None immediate. TECHNIQUE: Informed written consent was obtained from the patient after a discussion of the risks, benefits and alternatives to treatment. Questions regarding the procedure were encouraged and answered. A timeout was performed prior to the initiation of the procedure. The right groin was prepped and draped in the usual sterile fashion, and a sterile drape was applied covering the operative field. Maximum barrier sterile technique with sterile gowns and gloves were used for the procedure. A timeout was performed prior to the initiation of the procedure. Local anesthesia was provided with 1% lidocaine. The right femoral head was marked fluoroscopically. Under ultrasound guidance, the right common femoral artery was accessed with a micropuncture kit after the overlying soft tissues were anesthetized with 1% lidocaine. An ultrasound image was saved for documentation purposes. The micropuncture sheath was exchanged for a 5 [place] vascular sheath over a Bentson wire. A closure arteriogram  was performed through the side of the sheath confirming access within the right common femoral artery. Over a Bentson wire, a Mickelson catheter was advanced to the level of the thoracic aorta where it was back bled and flushed. The catheter was then utilized to select the superior mesenteric artery and a superior mesenteric arteriogram with delayed portal venous imaging was performed. The Mickelson catheter was then advanced cranially and utilized to select the celiac artery and a celiac arteriogram was performed. Utilizing a fathom 14 microwire, a regular renegade micro catheter was utilized to select common and proper hepatic arteries and selective arteriograms were performed. The Renegade microcatheter was utilized to select an accessory pancreaticoduodenal artery noted to arise subjacent to the left hepatic artery. Selective arteriogram was performed confirming appropriate positioning and the vessel was subsequent percutaneously coil embolized with 3 overlapping 2 mm x 5 mm partial coils to near the vessel's origin. The microcatheter was retracted into the proper hepatic artery and a post embolization proper hepatic arteriogram was performed Next, the microcatheter was utilized to select the accessory right hepatic artery and accessory right hepatic arteriogram was performed. From this location, 2 mCi of technetium 99 MAA was administered. Next the microcatheter was advanced into the right hepatic artery and a selective right hepatic arteriogram was performed. From the location proximal to the vessels bifurcation, 3 mCi of technetium 99 MAA was administered. At this point, the procedure was terminated. All wires and catheters and sheaths were removed from the patient. Hemostasis was achieved at the right groin and access site with manual compression. A dressing was placed. The patient tolerated procedure well without immediate postprocedural complication. The patient was escorted to nuclear medicine department  for planar imaging. FINDINGS: Selective superior mesenteric arteriogram was again negative for accessory or replaced hepatic arterial supply. Late portal venous phase imaging demonstrates patency of the main, right and left portal veins. Celiac arteriogram re-demonstrates an accessory right hepatic artery supplying the medial aspect of the right lobe of the liver. 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 Successful split dose administration of a total of 5 mCi of technetium  28mMAA via the right hepatic and accessory right hepatic arteries. IMPRESSION: 1. Successful pre Y-90 arteriogram with percutaneous coil embolization of the pancreaticoduodenal artery. 2. Successful split dose administration of a total of 5 mCi of technetium 929mAA via the right hepatic and accessory right hepatic arteries. Awaiting results of nuclear medicine liver scan. PLAN: Pending the results of the nuclear medicine liver scan, the patient will return Y90 radioembolization the right hepatic artery. Pending the patient's recovery from this initial treatment session, the patient will return for radioembolization of the accessory right hepatic artery. Electronically Signed   By: JoSandi Mariscal.D.   On: 12/08/2017 13:45   Ir Angiogram Selective Each Additional Vessel  Result Date: 12/08/2017 INDICATION: History of multifocal hepatocellular carcinoma. Patient presents today for Y 90 mapping procedure. Please refer to formal consultation in the epic EMR dated 11/17/2017 for additional details. EXAM: 1. ULTRASOUND GUIDANCE FOR ARTERIAL ACCESS 2. CELIAC AND SUPERIOR MESENTERIC ARTERIOGRAM (1st ORDER) 3. SELECTIVE COMMON AND PROPER HEPATIC ARTERIOGRAMS 4. SELECTIVE ARTERIOGRAM OF ACCESSORY PANCREATICODUODENAL ARTERY AND PERCUTANEOUS COIL EMBOLIZATION 5. SELECTIVE ARTERIOGRAM OF ACCESSORY RIGHT  HEPATIC ARTERY AND ADMINISTRATION OF Tc9951mA 6. SELECTIVE ARTERIOGRAM OF THE RIGHT HEPATIC ARTERY AND ADMINISTRATION OF Tc99m22m COMPARISON:  Hepatic bland embolization - 07/25/2017; abdominal MRI - 11/01/2017; CTA of the abdomen pelvis - 07/07/2017 MEDICATIONS: None RADIOPHARMACEUTICALS:  A total of 5 mCi Tc99m 22mwas administered, 2 mCi via an accessory right hepatic artery and 3 mCi via the right hepatic artery. CONTRAST:  75 cc Isovue 300 ANESTHESIA/SEDATION: Moderate (conscious) sedation was employed during this procedure. A total of Versed 3 mg and Fentanyl 150 mcg was administered intravenously. Moderate Sedation Time: 59 minutes. The patient's level of consciousness and vital signs were monitored continuously by radiology nursing throughout the procedure under my direct supervision. FLUOROSCOPY TIME:  14 minutes, 48 seconds (584 mGy) ACCESS: Right common femoral artery; hemostasis achieved with manual compression. COMPLICATIONS: None immediate. TECHNIQUE: Informed written consent was obtained from the patient after a discussion of the risks, benefits and alternatives to treatment. Questions regarding the procedure were encouraged and answered. A timeout was performed prior to the initiation of the procedure. The right groin was prepped and draped in the usual sterile fashion, and a sterile drape was applied covering the operative field. Maximum barrier sterile technique with sterile gowns and gloves were used for the procedure. A timeout was performed prior to the initiation of the procedure. Local anesthesia was provided with 1% lidocaine. The right femoral head was marked fluoroscopically. Under ultrasound guidance, the right common femoral artery was accessed with a micropuncture kit after the overlying soft tissues were anesthetized with 1% lidocaine. An ultrasound image was saved for documentation purposes. The micropuncture sheath was exchanged for a 5 [place] sheath over a Bentson wire. A  closure arteriogram was performed through the side of the sheath confirming access within the right common femoral artery. Over a Bentson wire, a Mickelson catheter was advanced to the level of the thoracic aorta where it was back bled and flushed. The catheter was then utilized to select the superior mesenteric artery and a superior mesenteric arteriogram with delayed portal venous imaging was performed. The Mickelson catheter was then advanced cranially and utilized to select the celiac artery and a celiac arteriogram was performed. Utilizing a fathom 14 microwire, a regular renegade micro catheter was utilized to select common and proper hepatic arteries and selective arteriograms were performed. The Renegade microcatheter was utilized to select an accessory  pancreaticoduodenal artery noted to arise subjacent to the left hepatic artery. Selective arteriogram was performed confirming appropriate positioning and the vessel was subsequent percutaneously coil embolized with 3 overlapping 2 mm x 5 mm partial coils to near the vessel's origin. The microcatheter was retracted into the proper hepatic artery and a post embolization proper hepatic arteriogram was performed Next, the microcatheter was utilized to select the accessory right hepatic artery and accessory right hepatic arteriogram was performed. From this location, 2 mCi of technetium 99 MAA was administered. Next the microcatheter was advanced into the right hepatic artery and a selective right hepatic arteriogram was performed. From the location proximal to the vessels bifurcation, 3 mCi of technetium 99 MAA was administered. At this point, the procedure was terminated. All wires and catheters and sheaths were removed from the patient. Hemostasis was achieved at the right groin and access site with manual compression. A dressing was placed. The patient tolerated procedure well without immediate postprocedural complication. The patient was escorted to nuclear  medicine department for planar imaging. FINDINGS: Selective superior mesenteric arteriogram was again negative for accessory or replaced hepatic arterial supply. Late portal venous phase imaging demonstrates patency of the main, right and left portal veins. Celiac arteriogram re-demonstrates an accessory right hepatic artery supplying the medial aspect of the right lobe of the liver. 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 Successful split dose administration of a total of 5 mCi of technetium 62mMAA via the right hepatic and accessory right hepatic arteries. IMPRESSION: 1. Successful pre Y-90 arteriogram with percutaneous coil embolization of the pancreaticoduodenal artery. 2. Successful split dose administration of a total of 5 mCi of technetium 982mAA via the right hepatic and accessory right hepatic arteries. Awaiting results of nuclear medicine liver scan. PLAN: Pending the results of the nuclear medicine liver scan, the patient will return Y90 radioembolization the right hepatic artery. Pending the patient's recovery from this initial treatment session, the patient will return for radioembolization of the accessory right hepatic artery. Electronically Signed   By: JoSandi Mariscal.D.   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 COMPARISON:  CT abdomen and pelvis - 12/15/2017; 12/08/2017 FINDINGS: There is an approximately 3.7 x 3.3 x 5.0 cm  mixed echogenic hematoma within the right groin, minimally decreased in size compared to the 12/15/2017 abdominal CT, previously, 4.0 x 3.5 x 5.4 cm. 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 The neck of the aneurysm measures approximately 3 mm in diameter and extends for a length of approximately 0.5 cm (images 1 and 2, series 2). The overall size of the opacified portion of the aneurysm measures approximately 1.5 x 1.2 cm, unchanged to slightly decreased in size compared to abdominal CT performed 12/15/2017 previously, 2.4 x 1.6 x 1.3 cm. 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 Electronically Signed   By: Sandi Mariscal M.D.   On: 12/21/2017 11:41   Ir Fluoro Rm 30-60 Min  Result Date: 12/09/2017 CLINICAL DATA:  Patient underwent technically successful mapping Y 90 radioembolization earlier today however prior to discharge developed acute onset of right lower abdominal pain. Subsequent CTA demonstrated small area of persistent extravasation at the right common femoral access site. Please perform fluoroscopic assisted localization of the access site of the right common femoral artery prior to continued manual compression in hopes of achieving hemostasis. EXAM: IR FLOURO RM 0-60 MIN TECHNIQUE: Patient was placed supine on the fluoroscopy table and the approximate location of the right common femoral arterial access site was marked fluoroscopically. Next, approximately 30 minutes of additional manual compression was performed at this location. Patient reports subjective improvement in his right lower abdominal pain and has remained hemodynamically stable throughout this prolonged observation in interventional radiology  department. CONTRAST:  None FLUOROSCOPY TIME:  12 seconds COMPARISON:  Mapping Y 90 radioembolization - earlier same day; CT abdomen pelvis - earlier same day FINDINGS: Patient was placed supine on the fluoroscopy table and the approximate location of the right common femoral arterial access site was marked fluoroscopically. Next, approximately 30 minutes of additional manual compression was performed at this location. Patient reports subjective improvement in his right lower abdominal pain and has remained hemodynamically stable throughout this prolonged observation in interventional radiology department. IMPRESSION: Successful fluoroscopic guided marking of the right femoral head followed by additional 30 minutes of manual compression at the right common femoral arterial access site. Electronically Signed   By: Sandi Mariscal M.D.   On: 12/09/2017 13:48   Ir US Guide Vasc Access Right  Result Date: 12/08/2017 INDICATION: History of multifocal hepatocellular carcinoma. Patient presents today for Y 90 mapping procedure. Please refer to formal consultation in the epic EMR dated 11/17/2017 for additional details. EXAM: 1. ULTRASOUND GUIDANCE FOR ARTERIAL ACCESS 2. CELIAC AND SUPERIOR MESENTERIC ARTERIOGRAM (1st ORDER) 3. SELECTIVE COMMON AND PROPER HEPATIC ARTERIOGRAMS 4. SELECTIVE ARTERIOGRAM OF ACCESSORY PANCREATICODUODENAL ARTERY AND PERCUTANEOUS COIL EMBOLIZATION 5. SELECTIVE ARTERIOGRAM OF ACCESSORY RIGHT HEPATIC ARTERY AND ADMINISTRATION OF Tc27mMAA 6. SELECTIVE ARTERIOGRAM OF THE RIGHT HEPATIC ARTERY AND ADMINISTRATION OF Tc936mAA COMPARISON:  Hepatic bland embolization - 07/25/2017; abdominal MRI - 11/01/2017; CTA of the abdomen pelvis - 07/07/2017 MEDICATIONS: None RADIOPHARMACEUTICALS:  A total of 5 mCi Tc9981mA was administered, 2 mCi via an accessory right hepatic artery and 3 mCi via the right hepatic artery. CONTRAST:  75 cc Isovue 300 ANESTHESIA/SEDATION: Moderate (conscious) sedation was employed  during this procedure. A total of Versed 3 mg and Fentanyl 150 mcg was administered intravenously. Moderate Sedation Time: 59 minutes. The patient's level of consciousness and vital signs were monitored continuously by radiology nursing throughout the procedure under my direct supervision. FLUOROSCOPY TIME:  14 minutes, 48 seconds (584 mGy) ACCESS: Right common femoral artery; hemostasis achieved with manual compression. COMPLICATIONS: None immediate. TECHNIQUE: Informed written consent was obtained from the patient after a discussion of the risks, benefits and alternatives to treatment. Questions regarding the procedure were encouraged and answered. A timeout was performed prior to the initiation of the procedure. The right groin was prepped and draped in the usual sterile fashion, and a sterile drape was applied covering the operative field. Maximum barrier sterile technique with sterile gowns and gloves were used for the procedure. A timeout was performed prior to the initiation of the procedure. Local anesthesia  was provided with 1% lidocaine. The right femoral head was marked fluoroscopically. Under ultrasound guidance, the right common femoral artery was accessed with a micropuncture kit after the overlying soft tissues were anesthetized with 1% lidocaine. An ultrasound image was saved for documentation purposes. The micropuncture sheath was exchanged for a 5 [place] vascular sheath over a Bentson wire. A closure arteriogram was performed through the side of the sheath confirming access within the right common femoral artery. Over a Bentson wire, a Mickelson catheter was advanced to the level of the thoracic aorta where it was back bled and flushed. The catheter was then utilized to select the superior mesenteric artery and a superior mesenteric arteriogram with delayed portal venous imaging was performed. The Mickelson catheter was then advanced cranially and utilized to select the celiac artery and a celiac  arteriogram was performed. Utilizing a fathom 14 microwire, a regular renegade micro catheter was utilized to select common and proper hepatic arteries and selective arteriograms were performed. The Renegade microcatheter was utilized to select an accessory pancreaticoduodenal artery noted to arise subjacent to the left hepatic artery. Selective arteriogram was performed confirming appropriate positioning and the vessel was subsequent percutaneously coil embolized with 3 overlapping 2 mm x 5 mm partial coils to near the vessel's origin. The microcatheter was retracted into the proper hepatic artery and a post embolization proper hepatic arteriogram was performed Next, the microcatheter was utilized to select the accessory right hepatic artery and accessory right hepatic arteriogram was performed. From this location, 2 mCi of technetium 99 MAA was administered. Next the microcatheter was advanced into the right hepatic artery and a selective right hepatic arteriogram was performed. From the location proximal to the vessels bifurcation, 3 mCi of technetium 99 MAA was administered. At this point, the procedure was terminated. All wires and catheters and sheaths were removed from the patient. Hemostasis was achieved at the right groin and access site with manual compression. A dressing was placed. The patient tolerated procedure well without immediate postprocedural complication. The patient was escorted to nuclear medicine department for planar imaging. FINDINGS: Selective superior mesenteric arteriogram was again negative for accessory or replaced hepatic arterial supply. Late portal venous phase imaging demonstrates patency of the main, right and left portal veins. Celiac arteriogram re-demonstrates an accessory right hepatic artery supplying the medial aspect of the right lobe of the liver. An accessory pancreaticoduodenal artery was again noted to arise subjacent to the takeoff of the left hepatic artery and was  subsequently selected and coil embolized to near the vessel's origin. Successful split dose administration of a total of 5 mCi of technetium 56mMAA via the right hepatic and accessory right hepatic arteries. IMPRESSION: 1. Successful pre Y-90 arteriogram with percutaneous coil embolization of the pancreaticoduodenal artery. 2. Successful split dose administration of a total of 5 mCi of technetium 964mAA via the right hepatic and accessory right hepatic arteries. Awaiting results of nuclear medicine liver scan. PLAN: Pending the results of the nuclear medicine liver scan, the patient will return Y90 radioembolization the right hepatic artery. Pending the patient's recovery from this initial treatment session, the patient will return for radioembolization of the accessory right hepatic artery. Electronically Signed   By: JoSandi Mariscal.D.   On: 12/08/2017 13:45   Dg Foot Complete Left  Result Date: 12/15/2017 CLINICAL DATA:  Left foot pain and swelling beginning yesterday. No known injury. EXAM: LEFT FOOT - COMPLETE 3+ VIEW COMPARISON:  [id number] FINDINGS: Nonspecific soft tissue swelling in the region  of the MTP joint of the great toe. No evidence of erosion. The remainder the exam is negative. Question gout. Arterial calcification incidentally noted. IMPRESSION: Nonspecific soft tissue swelling in the region of the MTP joint of the great toe at could be seen with gout. No osseous finding however. Electronically Signed   By: Nelson Chimes M.D.   On: 12/15/2017 17:09   Ir Embo Arterial Not [place] Guide Roadmapping  Result Date: 12/08/2017 INDICATION: History of multifocal hepatocellular carcinoma. Patient presents today for Y 90 mapping procedure. Please refer to formal consultation in the epic EMR dated 11/17/2017 for additional details. EXAM: 1. ULTRASOUND GUIDANCE FOR ARTERIAL ACCESS 2. CELIAC AND SUPERIOR MESENTERIC ARTERIOGRAM (1st ORDER) 3. SELECTIVE COMMON AND PROPER HEPATIC ARTERIOGRAMS 4.  SELECTIVE ARTERIOGRAM OF ACCESSORY PANCREATICODUODENAL ARTERY AND PERCUTANEOUS COIL EMBOLIZATION 5. SELECTIVE ARTERIOGRAM OF ACCESSORY RIGHT HEPATIC ARTERY AND ADMINISTRATION OF Tc51mMAA 6. SELECTIVE ARTERIOGRAM OF THE RIGHT HEPATIC ARTERY AND ADMINISTRATION OF Tc960mAA COMPARISON:  Hepatic bland embolization - 07/25/2017; abdominal MRI - 11/01/2017; CTA of the abdomen pelvis - 07/07/2017 MEDICATIONS: None RADIOPHARMACEUTICALS:  A total of 5 mCi Tc9935mA was administered, 2 mCi via an accessory right hepatic artery and 3 mCi via the right hepatic artery. CONTRAST:  75 cc Isovue 300 ANESTHESIA/SEDATION: Moderate (conscious) sedation was employed during this procedure. A total of Versed 3 mg and Fentanyl 150 mcg was administered intravenously. Moderate Sedation Time: 59 minutes. The patient's level of consciousness and vital signs were monitored continuously by radiology nursing throughout the procedure under my direct supervision. FLUOROSCOPY TIME:  14 minutes, 48 seconds (584 mGy) ACCESS: Right common femoral artery; hemostasis achieved with manual compression. COMPLICATIONS: None immediate. TECHNIQUE: Informed written consent was obtained from the patient after a discussion of the risks, benefits and alternatives to treatment. Questions regarding the procedure were encouraged and answered. A timeout was performed prior to the initiation of the procedure. The right groin was prepped and draped in the usual sterile fashion, and a sterile drape was applied covering the operative field. Maximum barrier sterile technique with sterile gowns and gloves were used for the procedure. A timeout was performed prior to the initiation of the procedure. Local anesthesia was provided with 1% lidocaine. The right femoral head was marked fluoroscopically. Under ultrasound guidance, the right common femoral artery was accessed with a micropuncture kit after the overlying soft tissues were anesthetized with 1% lidocaine. An  ultrasound image was saved for documentation purposes. The micropuncture sheath was exchanged for a 5 [place] sheath over a Bentson wire. A closure arteriogram was performed through the side of the sheath confirming access within the right common femoral artery. Over a Bentson wire, a Mickelson catheter was advanced to the level of the thoracic aorta where it was back bled and flushed. The catheter was then utilized to select the superior mesenteric artery and a superior mesenteric arteriogram with delayed portal venous imaging was performed. The Mickelson catheter was then advanced cranially and utilized to select the celiac artery and a celiac arteriogram was performed. Utilizing a fathom 14 microwire, a regular renegade micro catheter was utilized to select common and proper hepatic arteries and selective arteriograms were performed. The Renegade microcatheter was utilized to select an accessory pancreaticoduodenal artery noted to arise subjacent to the left hepatic artery. Selective arteriogram was performed confirming appropriate positioning and the vessel was subsequent percutaneously coil embolized with 3 overlapping 2 mm x 5 mm partial coils to near the vessel's origin. The microcatheter was  retracted into the proper hepatic artery and a post embolization proper hepatic arteriogram was performed Next, the microcatheter was utilized to select the accessory right hepatic artery and accessory right hepatic arteriogram was performed. From this location, 2 mCi of technetium 99 MAA was administered. Next the microcatheter was advanced into the right hepatic artery and a selective right hepatic arteriogram was performed. From the location proximal to the vessels bifurcation, 3 mCi of technetium 99 MAA was administered. At this point, the procedure was terminated. All wires and catheters and sheaths were removed from the patient. Hemostasis was achieved at the right groin and access site with manual  compression. A dressing was placed. The patient tolerated procedure well without immediate postprocedural complication. The patient was escorted to nuclear medicine department for planar imaging. FINDINGS: Selective superior mesenteric arteriogram was again negative for accessory or replaced hepatic arterial supply. Late portal venous phase imaging demonstrates patency of the main, right and left portal veins. Celiac arteriogram re-demonstrates an accessory right hepatic artery supplying the medial aspect of the right lobe of the liver. 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 Successful split dose administration of a total of 5 mCi of technetium 53mMAA via the right hepatic and accessory right hepatic arteries. IMPRESSION: 1. Successful pre Y-90 arteriogram with percutaneous coil embolization of the pancreaticoduodenal artery. 2. Successful split dose administration of a total of 5 mCi of technetium 930mAA via the right hepatic and accessory right hepatic arteries. Awaiting results of nuclear medicine liver scan. PLAN: Pending the results of the nuclear medicine liver scan, the patient will return Y90 radioembolization the right hepatic artery. Pending the patient's recovery from this initial treatment session, the patient will return for radioembolization of the accessory right hepatic artery. Electronically Signed   By: JoSandi Mariscal.D.   On: 12/08/2017 13:45   Ir Radiologist Eval & Mgmt  Result Date: 12/21/2017 Please refer to notes tab for details about interventional procedure. (Op Note)  Ct Angio Abd/pel W/ And/or W/o  Result Date: 12/08/2017 CLINICAL DATA:  Mapping Y 90 radioembolization earlier today, now with right lower abdominal pain. Evaluate for hematoma/access site complication. EXAM: CTA ABDOMEN AND PELVIS WITH CONTRAST TECHNIQUE:  Multidetector CT imaging of the abdomen and pelvis was performed using the standard protocol during bolus administration of intravenous contrast. Multiplanar reconstructed images and MIPs were obtained and reviewed to evaluate the vascular anatomy. CONTRAST:  [id number] IOPAMIDOL (ISOVUE-370) INJECTION 76% COMPARISON:  Abdominal MRI-11/01/2017; CT abdomen pelvis - 07/07/2017 FINDINGS: VASCULAR Aorta: Scattered mixed calcified and noncalcified atherosclerotic plaque with a normal caliber abdominal aorta, not resulting in hemodynamically significant stenosis. Celiac: There is a minimal amount of atherosclerotic plaque involving the origin the celiac artery, not resulting in hemodynamically significant stenosis. Sequela of coil embolization the pancreaticoduodenal artery. SMA: There is a moderate amount of mixed calcified and noncalcified atherosclerotic plaque involving the origin of the SMA which approaches of approximately 50% luminal narrowing. Renals: Solitary bilaterally. There is a minimal amount of calcified atherosclerotic plaque involving the origin of left renal artery, not resulting in a hemodynamically significant stenosis. No vessel irregularity to suggest FMD. IMA: Remains widely patent. Pelvic inflow: There is a minimal amount of calcified atherosclerotic plaque involving the bilateral common iliac arteries, not resulting in a hemodynamically significant stenosis. The bilateral common iliac arteries are diseased though of normal caliber. The bilateral  external iliac arteries are of normal caliber and widely patent. Right-sided proximal outflow: There is a minimal amount of mixed calcified and noncalcified atherosclerotic plaque within the right common femoral artery. There is a tiny area of active extravasation at the location of the right common femoral artery access site (representative images 23 through 25) with associated hematoma tracking into the right lower pelvis and abdomen. No definitive  retroperitoneal hematoma. The adjacent inferior epigastric artery appears widely patent as does the right deep iliac circumflex artery. No vessel dissection. The imaged portions of the right superficial and deep femoral artery appear widely patent. Left-sided proximal outflow: The left common femoral artery is widely patent without hemodynamically significant stenosis. There is a minimal amount of calcified atherosclerotic plaque involving the origin of the left superficial femoral artery, not resulting in hemodynamically significant stenosis. The left deep femoral artery is widely patent throughout its imaged course. Veins: The pelvic venous system and IVC appear widely patent. Review of the MIP images confirms the above findings. NON-VASCULAR Lower chest: Limited visualization of the lower thorax is negative for focal airspace opacity or pleural effusion. Coronary artery calcifications.  No pericardial effusion. 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 Re demonstrated ill-defined area of hyperenhancement adjacent to the ablation zone within the anterior segment of the right lobe of the liver (image 23, series 2 as well as nodular areas of enhancement within the right lobe of the liver (images 21, 24 and 34) compatible with known multifocal hepatocellular carcinoma. The portal vein appears widely patent. Normal appearance of the gallbladder.  No radiopaque gallstones. Pancreas: Normal appearance of the pancreas Spleen: Normal appearance of the spleen Adrenals/Urinary Tract: Excreted contrast from a prior arteriogram seen within bilateral renal collecting system. No definite renal stones. No discrete renal lesions. No urinary obstruction or perinephric stranding. Excreted contrast seen within the urinary bladder. Stomach/Bowel: There is minimal mass effect upon the cecum a distal sigmoid colon secondary to the right lower abdominal hematoma. The bowel is otherwise normal in  course and caliber without wall thickening or evidence of enteric obstruction. No pneumoperitoneum, pneumatosis or portal venous gas. Lymphatic: No bulky porta hepatis, retroperitoneal, mesenteric, pelvic or inguinal lymphadenopathy. Reproductive: Dystrophic calcifications within normal sized prostate gland. No free fluid in the pelvic cul-de-sac. Other: Minimal amount of expected stranding within the right groin regional to the access site. Musculoskeletal: No acute or aggressive osseous abnormalities. Stigmata of DISH within the lower thoracic spine. Mild-to-moderate multilevel lumbar spine DDD, worse at a definitive 3 and L3-L4 with disc space height loss, endplate irregularity and sclerosis. Bilateral facet degenerative change within the lower lumbar spine. Bone island is noted involving the posterior aspect the right ilium, unchanged. IMPRESSION: VASCULAR 1. Tiny area of active extravasation from the right common femoral artery access site with associated hematoma extending from the right pelvis to the right lower abdomen. Patient was subsequently brought to the interventional radiology suite and approximately 30 additional minutes of manual compression was applied. 2.  Aortic Atherosclerosis (ICD10-I70.0). NON-VASCULAR 1. Otherwise, no acute findings within the abdomen or pelvis. 2. Similar findings of hepatic cirrhosis and multifocal hepatocellular carcinoma as seen on recent abdominal MRI. Electronically Signed   By: Sandi Mariscal M.D.   On: 12/08/2017 16:53   Nm Fusion  Result Date: 12/08/2017 CLINICAL DATA:  Pre yttrium 90 radioembolization evaluation. EXAM: NUCLEAR MEDICINE LIVER SCAN; ULTRASOUND MISCELLANEOUS SOFT TISSUE TECHNIQUE: Abdominal images were obtained in multiple projections after intrahepatic arterial injection of radiopharmaceutical. SPECT imaging  was performed. Lung shunt calculation was performed. RADIOPHARMACEUTICALS:  4.15mllicurie MAA TECHNETIUM TO 45M ALBUMIN AGGREGATED COMPARISON:   MRI 06/28/2017, angiography 12/08/2017 FINDINGS: The injected microaggregated albumin localizes within the RIGHT hepatic lobe. Small focus of activity in the porta hepatis. No evidence of activity within the stomach, duodenum, or bowel. Calculated shunt fraction to the lungs equals 8.0%. IMPRESSION: 1. Injected MAA tracer activity localizes to the RIGHT hepatic lobe. 2. Small focus of radiotracer activity within the porta hepatis. Recommend catheter angiography evaluation at time of treatment. 3. Lung shunt fraction equals 8.0%. Findings conveyed toJOHN Yarima Penman on 12/08/2017  at16:08. Electronically Signed   By: SSuzy BouchardM.D.   On: 12/08/2017 16:09    Labs:  CBC: Recent Labs    12/10/17 0249 12/10/17 0910 12/15/17 1631 12/16/17 1052  WBC 6.4 6.7 9.1 6.3  HGB 9.0* 9.0* 9.1* 8.8*  HCT 26.3* 26.1* 26.7* 26.1*  PLT 53* 47* 86* 87*    COAGS: Recent Labs    07/25/17 1146 12/08/17 0803  INR 1.14 1.12    BMP: Recent Labs    12/08/17 0803 12/09/17 0520 12/10/17 0249 12/15/17 1631  NA 138 139 140 136  K 5.1 4.3 4.1 3.9  CL 100* 104 109 103  CO2 25 21* 23 26  GLUCOSE 83 95 110* 95  BUN '11 13 9 10  ' CALCIUM 9.5 9.0 8.5* 8.7*  CREATININE 1.07 0.88 0.94 0.87  GFRNONAA >60 >60 >60 >60  GFRAA >60 >60 >60 >60    LIVER FUNCTION TESTS: Recent Labs    12/08/17 0803 12/09/17 0520 12/10/17 0249 12/15/17 1631  BILITOT 1.6* 1.8* 1.5* 2.3*  AST 75* 43* 40 45*  ALT 45 '31 25 31  ' ALKPHOS 124 91 83 133*  PROT 8.5* 6.6 6.1* 6.6  ALBUMIN 5.1* 4.0 3.6 3.8    TUMOR MARKERS: No results for input(s): AFPTM, CEA, CA199, CHROMGRNA in the last 8760 hours.  Assessment and Plan:  Mike HOBBYis a 77y.o. male with past medical history significant for hypertension, gout, esophageal varices and alcoholic cirrhosis who underwent image guided hepatic microwave ablation on 06/25/2016 and a bland embolization performed on 07/25/2017 who was found to have progression of multifocal hepatocellular  carcinoma for which he underwent technically successful Y 90 mapping procedure performed on 12/08/2017 prior to planned staged Y 90 radioembolization of both the dominant right and accessory right hepatic arteries.  Unfortunately, the mapping Y 90 radioembolization was complicated by development of postprocedural access site hematoma felt to be controlled with continued manual compression, however ultimately a small pseudoaneurysm was noted on abdominal CT performed 12/15/2017.  While Doppler evaluation performed of the right groin during his admission was negative for the presence of a pseudoaneurysm, repeat doppler evaluation performed today demonstrates a persistent, though likely decreased, hematoma and associated pseudoaneurysm adjacent to the right common femoral access site.  Given the persistence of this symptomatic pseudoaneurysm, I feel that dedicated intervention is likely warranted.  Given patient's postoperative course, he is understandably hesitant to pursue the Y 90 radioembolization.  I explained that this is very understandable as the Y 90 radial embolization would be performed solely with palliative intent, however I explained that if the embolization is not pursued, we would likely NOT pursue additional hepatic directed therapies in the future.  Following this prolonged and detailed conversation the patient and the patient's wife ultimately have elected to pursue the Y 90 radioembolization as originally scheduled.  Additionally, prolonged conversations were held with the patient regarding the  benefits and risks of attempted prolonged sonographic assisted manual compression, ultrasound-guided direct thrombin injection and well as percutaneous management the symptomatic right common femoral artery pseudoaneurysm.  As such, these procedure will both be performed as scheduled tomorrow at [facility].  Given the presence of the right common femoral pseudoaneurysm, a left common  femoral arterial approach will be utilized, and both procedures will be performed (hopefully) on an outpatient basis.  A copy of this report was sent to the requesting provider on this date.  Electronically Signed: Sandi Mariscal 12/21/2017, 1:05 PM   I spent a total of 25 Minutes in face to face in clinical consultation, greater than 50% of which was counseling/coordinating care for multifocal hepatocellular carcinoma

## 2017-12-22 ENCOUNTER — Encounter (HOSPITAL_COMMUNITY)
Admission: RE | Admit: 2017-12-22 | Discharge: 2017-12-22 | Disposition: A | Discharge status: Home / Self Care | Payer: No Typology Code available for payment source | Source: Ambulatory Visit | Attending: Interventional Radiology | Admitting: Interventional Radiology

## 2017-12-22 ENCOUNTER — Ambulatory Visit (HOSPITAL_COMMUNITY)
Admission: RE | Admit: 2017-12-22 | Discharge: 2017-12-22 | Disposition: A | Discharge status: Home / Self Care | Payer: No Typology Code available for payment source | Source: Ambulatory Visit | Attending: Interventional Radiology | Admitting: Interventional Radiology

## 2017-12-22 ENCOUNTER — Encounter (HOSPITAL_COMMUNITY): Payer: Self-pay

## 2017-12-22 ENCOUNTER — Other Ambulatory Visit (HOSPITAL_COMMUNITY): Payer: Self-pay | Admitting: Interventional Radiology

## 2017-12-22 DIAGNOSIS — I10 Essential (primary) hypertension: Secondary | ICD-10-CM | POA: Diagnosis not present

## 2017-12-22 DIAGNOSIS — C220 Liver cell carcinoma: Secondary | ICD-10-CM | POA: Insufficient documentation

## 2017-12-22 DIAGNOSIS — Z87891 Personal history of nicotine dependence: Secondary | ICD-10-CM | POA: Insufficient documentation

## 2017-12-22 DIAGNOSIS — M1990 Unspecified osteoarthritis, unspecified site: Secondary | ICD-10-CM | POA: Diagnosis not present

## 2017-12-22 DIAGNOSIS — K7030 Alcoholic cirrhosis of liver without ascites: Secondary | ICD-10-CM | POA: Diagnosis not present

## 2017-12-22 DIAGNOSIS — I8510 Secondary esophageal varices without bleeding: Secondary | ICD-10-CM | POA: Insufficient documentation

## 2017-12-22 DIAGNOSIS — I724 Aneurysm of artery of lower extremity: Secondary | ICD-10-CM | POA: Insufficient documentation

## 2017-12-22 HISTORY — PX: IR US GUIDE VASC ACCESS RIGHT: IMG2390

## 2017-12-22 HISTORY — PX: IR EMBO ART  VEN HEMORR LYMPH EXTRAV  INC GUIDE ROADMAPPING: IMG5450

## 2017-12-22 LAB — CBC WITH DIFFERENTIAL/PLATELET
BASOS ABS: 0 10*3/uL (ref 0.0–0.1)
Basophils Relative: 0 %
EOS PCT: 1 %
Eosinophils Absolute: 0.1 10*3/uL (ref 0.0–0.7)
HCT: 35.8 % — ABNORMAL LOW (ref 39.0–52.0)
Hemoglobin: 11.8 g/dL — ABNORMAL LOW (ref 13.0–17.0)
LYMPHS ABS: 1.9 10*3/uL (ref 0.7–4.0)
LYMPHS PCT: 18 %
MCH: 33 pg (ref 26.0–34.0)
MCHC: 33 g/dL (ref 30.0–36.0)
MCV: 100 fL (ref 78.0–100.0)
MONOS PCT: 24 %
Monocytes Absolute: 2.5 10*3/uL — ABNORMAL HIGH (ref 0.1–1.0)
Neutro Abs: 5.9 10*3/uL (ref 1.7–7.7)
Neutrophils Relative %: 57 %
Platelets: 136 10*3/uL — ABNORMAL LOW (ref 150–400)
RBC: 3.58 MIL/uL — AB (ref 4.22–5.81)
RDW: 15.6 % — AB (ref 11.5–15.5)
WBC: 10.4 10*3/uL (ref 4.0–10.5)

## 2017-12-22 LAB — COMPREHENSIVE METABOLIC PANEL
ALBUMIN: 4.5 g/dL (ref 3.5–5.0)
ALT: 59 U/L (ref 17–63)
ANION GAP: 12 (ref 5–15)
AST: 71 U/L — ABNORMAL HIGH (ref 15–41)
Alkaline Phosphatase: 142 U/L — ABNORMAL HIGH (ref 38–126)
BILIRUBIN TOTAL: 2.4 mg/dL — AB (ref 0.3–1.2)
BUN: 14 mg/dL (ref 6–20)
CO2: 25 mmol/L (ref 22–32)
Calcium: 9.1 mg/dL (ref 8.9–10.3)
Chloride: 104 mmol/L (ref 101–111)
Creatinine, Ser: 0.93 mg/dL (ref 0.61–1.24)
GFR calc Af Amer: >60 mL/min (ref >60)
GFR calc non Af Amer: >60 mL/min (ref >60)
GLUCOSE: 76 mg/dL (ref 65–99)
POTASSIUM: 4 mmol/L (ref 3.5–5.1)
SODIUM: 141 mmol/L (ref 135–145)
TOTAL PROTEIN: 7.7 g/dL (ref 6.5–8.1)

## 2017-12-22 LAB — PROTIME-INR
INR: 1.16
Prothrombin Time: 14.7 seconds (ref 11.4–15.2)

## 2017-12-22 MED ORDER — SODIUM CHLORIDE 0.9 % IV SOLN
INTRAVENOUS | Status: DC
  Administered 2017-12-22: 08:00:00 via INTRAVENOUS

## 2017-12-22 MED ORDER — FLUMAZENIL 0.5 MG/5ML IV SOLN
INTRAVENOUS | Status: AC
  Filled 2017-12-22: qty 5

## 2017-12-22 MED ORDER — NALOXONE HCL 0.4 MG/ML IJ SOLN
INTRAMUSCULAR | Status: AC
  Filled 2017-12-22: qty 1

## 2017-12-22 MED ORDER — PIPERACILLIN-TAZOBACTAM 3.375 G IVPB: 3.3750 g | Freq: Once | INTRAVENOUS | Status: DC

## 2017-12-22 MED ORDER — PIPERACILLIN-TAZOBACTAM 3.375 G IVPB
INTRAVENOUS | Status: AC
  Filled 2017-12-22: qty 50

## 2017-12-22 MED ORDER — FAMOTIDINE IN NACL 20-0.9 MG/50ML-% IV SOLN
20.0000 mg | Freq: Once | INTRAVENOUS | Status: AC
  Administered 2017-12-22: 20 mg via INTRAVENOUS
  Filled 2017-12-22: qty 50

## 2017-12-22 MED ORDER — DEXAMETHASONE SODIUM PHOSPHATE 10 MG/ML IJ SOLN
8.0000 mg | Freq: Once | INTRAMUSCULAR | Status: AC
  Administered 2017-12-22: 8 mg via INTRAVENOUS
  Filled 2017-12-22: qty 1

## 2017-12-22 MED ORDER — SODIUM CHLORIDE 0.9 % IV SOLN
8.0000 mg | Freq: Once | INTRAVENOUS | Status: AC
  Administered 2017-12-22: 8 mg via INTRAVENOUS
  Filled 2017-12-22: qty 4

## 2017-12-22 MED ORDER — IOPAMIDOL (ISOVUE-300) INJECTION 61%
INTRAVENOUS | Status: AC
  Filled 2017-12-22: qty 300

## 2017-12-22 MED ORDER — MIDAZOLAM HCL 2 MG/2ML IJ SOLN
INTRAMUSCULAR | Status: AC
  Filled 2017-12-22: qty 4

## 2017-12-22 MED ORDER — FENTANYL CITRATE (PF) 100 MCG/2ML IJ SOLN
INTRAMUSCULAR | Status: AC | PRN
  Administered 2017-12-22 (×2): 50 ug via INTRAVENOUS

## 2017-12-22 MED ORDER — LIDOCAINE HCL 1 % IJ SOLN
INTRAMUSCULAR | Status: AC
  Filled 2017-12-22: qty 20

## 2017-12-22 MED ORDER — THROMBIN 5000 UNITS EX KIT
5000.0000 [IU] | PACK | Freq: Once | CUTANEOUS | Status: AC
  Administered 2017-12-22: 20 [IU] via TOPICAL
  Filled 2017-12-22 (×2): qty 1

## 2017-12-22 MED ORDER — LIDOCAINE HCL (PF) 1 % IJ SOLN
INTRAMUSCULAR | Status: AC | PRN
  Administered 2017-12-22: 10 mL

## 2017-12-22 MED ORDER — FENTANYL CITRATE (PF) 100 MCG/2ML IJ SOLN
INTRAMUSCULAR | Status: AC
  Filled 2017-12-22: qty 4

## 2017-12-22 NOTE — H&P (Signed)
Chief Complaint: Patient was seen in consultation today for hepatocellular carcinoma and right common femoral artery pseudoaneurysm.  Referring Physician(s): Lear Ng  Supervising Physician: Sandi Mariscal  Patient Status: [facility] - Out-pt  History of Present Illness: Mike Ferguson is a 77 y.o. male with a past medical history of hypertension, alcoholic cirrhosis, esophageal varices, arthritis, and hepatocellular carcinoma. He underwent pre-Y90 mapping with Dr. Pascal Lux on 12/08/2017. Post-procedural, he developed a pseudoaneurysm of the right common femoral artery, which made him hesitant to move forward with Y90 radioembolization. Due to his post-procedure complication, he was seen in clinic with Dr. Pascal Lux 12/21/2017. From this conversation, patient decided to move forward with Y90 radioembolization. In addition, treatment of pseudoaneurysm is also warranted.  Patient presents today for Y90 radioembolization and thrombin injection of right common femoral artery pseudoaneurysm. Patient awake and alert laying in bed with no complaints at this time. Accompanied by wife at bedside. Denies fever, chills, chest pain, dyspnea, abdominal pain, or dizziness.  Past Medical History:  Diagnosis Date  . Arthritis    generalized-back  . Cirrhosis ([place])   . Cirrhosis, alcoholic ([place])   . Esophageal varices ([place])   . Hearing impaired person, bilateral    hearing aids bilaterally  . Hepatocellular carcinoma ([place])   . Hypertension     Past Surgical History:  Procedure Laterality Date  . HERNIA REPAIR     RIH  . IR ANGIOGRAM SELECTIVE EACH ADDITIONAL VESSEL  07/25/2017  . IR ANGIOGRAM SELECTIVE EACH ADDITIONAL VESSEL  07/25/2017  . IR ANGIOGRAM SELECTIVE EACH ADDITIONAL VESSEL  07/25/2017  . IR ANGIOGRAM SELECTIVE EACH ADDITIONAL VESSEL  07/25/2017  . IR ANGIOGRAM SELECTIVE EACH ADDITIONAL VESSEL  12/08/2017  . IR ANGIOGRAM SELECTIVE EACH ADDITIONAL VESSEL  12/08/2017  . IR ANGIOGRAM SELECTIVE EACH  ADDITIONAL VESSEL  12/08/2017  . IR ANGIOGRAM SELECTIVE EACH ADDITIONAL VESSEL  12/08/2017  . IR ANGIOGRAM SELECTIVE EACH ADDITIONAL VESSEL  12/08/2017  . IR ANGIOGRAM VISCERAL SELECTIVE  07/25/2017  . IR ANGIOGRAM VISCERAL SELECTIVE  07/25/2017  . IR ANGIOGRAM VISCERAL SELECTIVE  12/08/2017  . IR ANGIOGRAM VISCERAL SELECTIVE  12/08/2017  . IR EMBO ARTERIAL NOT HEMORR HEMANG INC GUIDE ROADMAPPING  12/08/2017  . IR EMBO TUMOR ORGAN ISCHEMIA INFARCT INC GUIDE ROADMAPPING  07/25/2017  . IR FLUORO RM 30-60 MIN  12/08/2017  . IR GENERIC HISTORICAL  06/17/2016   IR RADIOLOGIST EVAL & MGMT 06/17/2016 Sandi Mariscal, MD [facility] INTERV RAD  . IR GENERIC HISTORICAL  07/21/2016   IR RADIOLOGIST EVAL & MGMT 07/21/2016 Sandi Mariscal, MD [facility] INTERV RAD  . IR RADIOLOGIST EVAL & MGMT  10/26/2016  . IR RADIOLOGIST EVAL & MGMT  04/05/2017  . IR RADIOLOGIST EVAL & MGMT  01/11/2017  . IR RADIOLOGIST EVAL & MGMT  06/28/2017  . IR RADIOLOGIST EVAL & MGMT  08/18/2017  . IR RADIOLOGIST EVAL & MGMT  11/17/2017  . IR RADIOLOGIST EVAL & MGMT  12/21/2017  . IR US GUIDE VASC ACCESS RIGHT  07/25/2017  . IR US GUIDE VASC ACCESS RIGHT  12/08/2017  . Left rotator cuff      Allergies: Erythromycin  Medications: Prior to Admission medications   Medication Sig Start Date End Date Taking? Authorizing Provider  acetaminophen (TYLENOL) 500 MG tablet Take 500 mg by mouth daily as needed for mild pain.   Yes [provider]  cholecalciferol (VITAMIN D) 1000 units tablet Take 2,000 Units by mouth daily.   Yes [provider]  loratadine-pseudoephedrine (CLARITIN-D 24-HOUR)  10-240 MG 24 hr tablet Take 1 tablet by mouth daily as needed for allergies.    Yes [provider]  mirtazapine (REMERON) 15 MG tablet Take 15 mg by mouth at bedtime.   Yes [provider]  polyethylene glycol (MIRALAX / GLYCOLAX) packet Take 17 g by mouth 2 (two) times daily. 12/10/17  Yes Sheikh, Omair Latif, DO  propranolol (INDERAL) 20 MG tablet  Take 20 mg by mouth 2 (two) times daily. 05/31/16  Yes [provider]  rifaximin (XIFAXAN) 550 MG TABS tablet Take 550 mg by mouth 2 (two) times daily.   Yes [provider]  senna-docusate (SENOKOT-S) 8.6-50 MG tablet Take 1 tablet by mouth at bedtime. 12/10/17  Yes Sheikh, Omair Latif, DO  sodium chloride (OCEAN) 0.65 % SOLN nasal spray Place 1 spray into both nostrils as needed for congestion.   Yes [provider]  tamsulosin (FLOMAX) 0.4 MG CAPS capsule TAKE 1 capsule Once in the evening Orally 30 day(s) 05/20/17  Yes [provider]  traMADol (ULTRAM) 50 MG tablet Take 50 mg by mouth every 6 (six) hours as needed for moderate pain or severe pain.   Yes [provider]  vitamin B-12 (CYANOCOBALAMIN) 100 MCG tablet Take 100 mcg by mouth daily.   Yes [provider]  vitamin C (ASCORBIC ACID) 500 MG tablet Take 500 mg by mouth daily.   Yes [provider]     History reviewed. No pertinent family history.  Social History   Socioeconomic History  . Marital status: Married    Spouse name: Not on file  . Number of children: Not on file  . Years of education: Not on file  . Highest education level: Not on file  Occupational History  . Not on file  Social Needs  . Financial resource strain: Not on file  . Food insecurity:    Worry: Not on file    Inability: Not on file  . Transportation needs:    Medical: Not on file    Non-medical: Not on file  Tobacco Use  . Smoking status: Former Smoker    Last attempt to quit: 06/24/1986    Years since quitting: 31.5  . Smokeless tobacco: Never Used  Substance and Sexual Activity  . Alcohol use: No    Comment: 2011 Quit- ETOH abuse  . Drug use: No  . Sexual activity: Not Currently  Lifestyle  . Physical activity:    Days per week: Not on file    Minutes per session: Not on file  . Stress: Not on file  Relationships  . Social connections:    Talks on phone: Not on file     Gets together: Not on file    Attends religious service: Not on file    Active member of club or organization: Not on file    Attends meetings of clubs or organizations: Not on file    Relationship status: Not on file  Other Topics Concern  . Not on file  Social History Narrative  . Not on file     Review of Systems: A 12 point ROS discussed and pertinent positives are indicated in the HPI above.  All other systems are negative.  Review of Systems  Constitutional: Negative for chills and fever.  Respiratory: Negative for shortness of breath and wheezing.   Cardiovascular: Negative for chest pain and palpitations.  Gastrointestinal: Negative for abdominal pain.  Neurological: Negative for dizziness.  Psychiatric/Behavioral: Negative for behavioral problems and confusion.  Vital Signs: BP (!) 198/82   Pulse (!) 58   Temp 97.7 F (36.5 C) (Oral)   Resp 14   SpO2 100%   Physical Exam  Constitutional: He is oriented to person, place, and time. He appears well-developed and well-nourished. No distress.  Cardiovascular: Normal rate, regular rhythm and normal heart sounds.  No murmur heard. Pulmonary/Chest: Effort normal and breath sounds normal. No respiratory distress. He has no wheezes.  Neurological: He is alert and oriented to person, place, and time.  Skin: Skin is warm and dry.  Psychiatric: He has a normal mood and affect. His behavior is normal. Judgment and thought content normal.  Nursing note and vitals reviewed.    MD Evaluation Airway: WNL Heart: WNL Abdomen: WNL Chest/ Lungs: WNL ASA  Classification: 3 Mallampati/Airway Score: One   Imaging: Ct Head Wo Contrast  Result Date: 12/09/2017 CLINICAL DATA:  Syncopal episode yesterday. Dizzy. Hepatocellular carcinoma. EXAM: CT HEAD WITHOUT CONTRAST TECHNIQUE: Contiguous axial images were obtained from the base of the skull through the vertex without intravenous contrast. COMPARISON:  None. FINDINGS: Brain:  Mild white matter changes are present bilaterally. No acute or focal cortical infarct is present. Basal ganglia are intact. Insular ribbon is normal bilaterally. The brainstem and cerebellum are normal. Ventricles are of normal size. No significant extra-axial fluid collection is present. Vascular: Atherosclerotic calcifications are present within the cavernous internal carotid arteries bilaterally. There is no hyperdense vessel. Skull: Calvarium is intact. No focal lytic or blastic lesions are present. Sinuses/Orbits: The paranasal sinuses and mastoid air cells are clear. Bilateral lens replacements are present. Globes and orbits are within normal limits. IMPRESSION: 1. No acute intracranial abnormality or focal lesion to explain the patient's syncopal episode or dizziness. 2. Mild white matter changes are within normal limits for age. 3. Atherosclerosis. Electronically Signed   By: San Morelle M.D.   On: 12/09/2017 13:26   Ct Angio Chest Pe W And/or Wo Contrast  Result Date: 12/15/2017 CLINICAL DATA:  Pt wife states the pt's doctor called and informed them he may have a blood clot. Pt had D-dimer of 4.54. Pt states his left leg hurts. Pt's wife noticed the pt has been wheezing intermittently since coming home from hospital 5 days ago. EXAM: CT ANGIOGRAPHY CHEST CT ABDOMEN AND PELVIS WITH CONTRAST TECHNIQUE: Multidetector CT imaging of the chest was performed using the standard protocol during bolus administration of intravenous contrast. Multiplanar CT image reconstructions and MIPs were obtained to evaluate the vascular anatomy. Multidetector CT imaging of the abdomen and pelvis was performed using the standard protocol during bolus administration of intravenous contrast. CONTRAST:  <See Chart> ISOVUE-300 IOPAMIDOL (ISOVUE-300) INJECTION 61%, 115m ISOVUE-370 IOPAMIDOL (ISOVUE-370) INJECTION 76% COMPARISON:  12/08/2017 and multiple prior studies including 2011 FINDINGS: CTA CHEST FINDINGS  Cardiovascular: The pulmonary arteries are well opacified. There is no evidence for acute pulmonary embolus. Coronary artery calcifications are present. There is atherosclerotic calcification of the thoracic aorta not associated with aneurysm. Mediastinum/Nodes: No enlarged mediastinal, hilar, or axillary lymph nodes. Thyroid gland, trachea, and esophagus demonstrate no significant findings. Lungs/Pleura: Mild patient motion artifact. No suspicious pulmonary nodules. No pleural effusions or consolidations. Airways are patent. Musculoskeletal: No chest wall abnormality. No acute or significant osseous findings. Review of the MIP images confirms the above findings. CT ABDOMEN and PELVIS FINDINGS Hepatobiliary: Cirrhotic contour of the liver. Low-attenuation again identified in the anterior segmental RIGHT hepatic lobe, without associated hyperemia best seen on the previous exam. Gallbladder is present. Pancreas: Unremarkable. No  pancreatic ductal dilatation or surrounding inflammatory changes. Spleen: UPPER normal in size.  No focal abnormality. Adrenals/Urinary Tract: No urinary tract obstruction. Urinary bladder is unremarkable. Stomach/Bowel: The stomach and small bowel loops are normal in appearance. The appendix is well seen and has a normal appearance. Colon is normal in appearance. Vascular/Lymphatic: In the RIGHT inguinal region there is a moderate hematoma following previous arterial access. Adjacent to the femoral artery, there is a contrast-enhancing bilobed circumscribed mass measuring 1.3 x 2.4 centimeters and consistent with a pseudoaneurysm. Reproductive: No significant adenopathy. Other: Prostatic calcifications are present. Musculoskeletal: No acute osseous abnormalities. Benign sclerotic lesion within the RIGHT posterior iliac wing. Review of the MIP images confirms the above findings. IMPRESSION: 1. Technically adequate exam showing no acute pulmonary embolus. 2. Atherosclerosis of the coronary  arteries an aorta. Aortic Atherosclerosis (ICD10-I70.0). 3. Cirrhosis and RIGHT hepatic lobe lesion. 4. RIGHT inguinal hematoma and interval development of 2.4 cm pseudoaneurysm adjacent to the RIGHT femoral artery. These results were called by telephone at the time of interpretation on 12/15/2017 at 7:13 pm to Dr. Davonna Belling , who verbally acknowledged these results. Electronically Signed   By: Nolon Nations M.D.   On: 12/15/2017 19:14   Nm Liver Img Spect  Result Date: 12/08/2017 CLINICAL DATA:  Pre yttrium 90 radioembolization evaluation. EXAM: NUCLEAR MEDICINE LIVER SCAN; ULTRASOUND MISCELLANEOUS SOFT TISSUE TECHNIQUE: Abdominal images were obtained in multiple projections after intrahepatic arterial injection of radiopharmaceutical. SPECT imaging was performed. Lung shunt calculation was performed. RADIOPHARMACEUTICALS:  4.74mllicurie MAA TECHNETIUM TO 51M ALBUMIN AGGREGATED COMPARISON:  MRI 06/28/2017, angiography 12/08/2017 FINDINGS: The injected microaggregated albumin localizes within the RIGHT hepatic lobe. Small focus of activity in the porta hepatis. No evidence of activity within the stomach, duodenum, or bowel. Calculated shunt fraction to the lungs equals 8.0%. IMPRESSION: 1. Injected MAA tracer activity localizes to the RIGHT hepatic lobe. 2. Small focus of radiotracer activity within the porta hepatis. Recommend catheter angiography evaluation at time of treatment. 3. Lung shunt fraction equals 8.0%. Findings conveyed toJOHN WATTS on 12/08/2017  at16:08. Electronically Signed   By: SSuzy BouchardM.D.   On: 12/08/2017 16:09   Ct Abdomen Pelvis W Contrast  Result Date: 12/15/2017 CLINICAL DATA:  Pt wife states the pt's doctor called and informed them he may have a blood clot. Pt had D-dimer of 4.54. Pt states his left leg hurts. Pt's wife noticed the pt has been wheezing intermittently since coming home from hospital 5 days ago. EXAM: CT ANGIOGRAPHY CHEST CT ABDOMEN AND PELVIS WITH  CONTRAST TECHNIQUE: Multidetector CT imaging of the chest was performed using the standard protocol during bolus administration of intravenous contrast. Multiplanar CT image reconstructions and MIPs were obtained to evaluate the vascular anatomy. Multidetector CT imaging of the abdomen and pelvis was performed using the standard protocol during bolus administration of intravenous contrast. CONTRAST:  <See Chart> ISOVUE-300 IOPAMIDOL (ISOVUE-300) INJECTION 61%, 1056mISOVUE-370 IOPAMIDOL (ISOVUE-370) INJECTION 76% COMPARISON:  12/08/2017 and multiple prior studies including 2011 FINDINGS: CTA CHEST FINDINGS Cardiovascular: The pulmonary arteries are well opacified. There is no evidence for acute pulmonary embolus. Coronary artery calcifications are present. There is atherosclerotic calcification of the thoracic aorta not associated with aneurysm. Mediastinum/Nodes: No enlarged mediastinal, hilar, or axillary lymph nodes. Thyroid gland, trachea, and esophagus demonstrate no significant findings. Lungs/Pleura: Mild patient motion artifact. No suspicious pulmonary nodules. No pleural effusions or consolidations. Airways are patent. Musculoskeletal: No chest wall abnormality. No acute or significant osseous findings. Review of the MIP images confirms  the above findings. CT ABDOMEN and PELVIS FINDINGS Hepatobiliary: Cirrhotic contour of the liver. Low-attenuation again identified in the anterior segmental RIGHT hepatic lobe, without associated hyperemia best seen on the previous exam. Gallbladder is present. Pancreas: Unremarkable. No pancreatic ductal dilatation or surrounding inflammatory changes. Spleen: UPPER normal in size.  No focal abnormality. Adrenals/Urinary Tract: No urinary tract obstruction. Urinary bladder is unremarkable. Stomach/Bowel: The stomach and small bowel loops are normal in appearance. The appendix is well seen and has a normal appearance. Colon is normal in appearance. Vascular/Lymphatic: In the  RIGHT inguinal region there is a moderate hematoma following previous arterial access. Adjacent to the femoral artery, there is a contrast-enhancing bilobed circumscribed mass measuring 1.3 x 2.4 centimeters and consistent with a pseudoaneurysm. Reproductive: No significant adenopathy. Other: Prostatic calcifications are present. Musculoskeletal: No acute osseous abnormalities. Benign sclerotic lesion within the RIGHT posterior iliac wing. Review of the MIP images confirms the above findings. IMPRESSION: 1. Technically adequate exam showing no acute pulmonary embolus. 2. Atherosclerosis of the coronary arteries an aorta. Aortic Atherosclerosis (ICD10-I70.0). 3. Cirrhosis and RIGHT hepatic lobe lesion. 4. RIGHT inguinal hematoma and interval development of 2.4 cm pseudoaneurysm adjacent to the RIGHT femoral artery. These results were called by telephone at the time of interpretation on 12/15/2017 at 7:13 pm to Dr. Davonna Belling , who verbally acknowledged these results. Electronically Signed   By: Nolon Nations M.D.   On: 12/15/2017 19:14   Ir Angiogram Visceral Selective  Result Date: 12/08/2017 INDICATION: History of multifocal hepatocellular carcinoma. Patient presents today for Y 90 mapping procedure. Please refer to formal consultation in the epic EMR dated 11/17/2017 for additional details. EXAM: 1. ULTRASOUND GUIDANCE FOR ARTERIAL ACCESS 2. CELIAC AND SUPERIOR MESENTERIC ARTERIOGRAM (1st ORDER) 3. SELECTIVE COMMON AND PROPER HEPATIC ARTERIOGRAMS 4. SELECTIVE ARTERIOGRAM OF ACCESSORY PANCREATICODUODENAL ARTERY AND PERCUTANEOUS COIL EMBOLIZATION 5. SELECTIVE ARTERIOGRAM OF ACCESSORY RIGHT HEPATIC ARTERY AND ADMINISTRATION OF Tc20mMAA 6. SELECTIVE ARTERIOGRAM OF THE RIGHT HEPATIC ARTERY AND ADMINISTRATION OF Tc943mAA COMPARISON:  Hepatic bland embolization - 07/25/2017; abdominal MRI - 11/01/2017; CTA of the abdomen pelvis - 07/07/2017 MEDICATIONS: None RADIOPHARMACEUTICALS:  A total of 5 mCi Tc9959mA  was administered, 2 mCi via an accessory right hepatic artery and 3 mCi via the right hepatic artery. CONTRAST:  75 cc Isovue 300 ANESTHESIA/SEDATION: Moderate (conscious) sedation was employed during this procedure. A total of Versed 3 mg and Fentanyl 150 mcg was administered intravenously. Moderate Sedation Time: 59 minutes. The patient's level of consciousness and vital signs were monitored continuously by radiology nursing throughout the procedure under my direct supervision. FLUOROSCOPY TIME:  14 minutes, 48 seconds (584 mGy) ACCESS: Right common femoral artery; hemostasis achieved with manual compression. COMPLICATIONS: None immediate. TECHNIQUE: Informed written consent was obtained from the patient after a discussion of the risks, benefits and alternatives to treatment. Questions regarding the procedure were encouraged and answered. A timeout was performed prior to the initiation of the procedure. The right groin was prepped and draped in the usual sterile fashion, and a sterile drape was applied covering the operative field. Maximum barrier sterile technique with sterile gowns and gloves were used for the procedure. A timeout was performed prior to the initiation of the procedure. Local anesthesia was provided with 1% lidocaine. The right femoral head was marked fluoroscopically. Under ultrasound guidance, the right common femoral artery was accessed with a micropuncture kit after the overlying soft tissues were anesthetized with 1% lidocaine. An ultrasound image was saved for documentation purposes. The  micropuncture sheath was exchanged for a 5 [place] vascular sheath over a Bentson wire. A closure arteriogram was performed through the side of the sheath confirming access within the right common femoral artery. Over a Bentson wire, a Mickelson catheter was advanced to the level of the thoracic aorta where it was back bled and flushed. The catheter was then utilized to select the superior mesenteric artery  and a superior mesenteric arteriogram with delayed portal venous imaging was performed. The Mickelson catheter was then advanced cranially and utilized to select the celiac artery and a celiac arteriogram was performed. Utilizing a fathom 14 microwire, a regular renegade micro catheter was utilized to select common and proper hepatic arteries and selective arteriograms were performed. The Renegade microcatheter was utilized to select an accessory pancreaticoduodenal artery noted to arise subjacent to the left hepatic artery. Selective arteriogram was performed confirming appropriate positioning and the vessel was subsequent percutaneously coil embolized with 3 overlapping 2 mm x 5 mm partial coils to near the vessel's origin. The microcatheter was retracted into the proper hepatic artery and a post embolization proper hepatic arteriogram was performed Next, the microcatheter was utilized to select the accessory right hepatic artery and accessory right hepatic arteriogram was performed. From this location, 2 mCi of technetium 99 MAA was administered. Next the microcatheter was advanced into the right hepatic artery and a selective right hepatic arteriogram was performed. From the location proximal to the vessels bifurcation, 3 mCi of technetium 99 MAA was administered. At this point, the procedure was terminated. All wires and catheters and sheaths were removed from the patient. Hemostasis was achieved at the right groin and access site with manual compression. A dressing was placed. The patient tolerated procedure well without immediate postprocedural complication. The patient was escorted to nuclear medicine department for planar imaging. FINDINGS: Selective superior mesenteric arteriogram was again negative for accessory or replaced hepatic arterial supply. Late portal venous phase imaging demonstrates patency of the main, right and left portal veins. Celiac arteriogram re-demonstrates an accessory right hepatic  artery supplying the medial aspect of the right lobe of the liver. 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 Successful split dose administration of a total of 5 mCi of technetium 58mMAA via the right hepatic and accessory right hepatic arteries. IMPRESSION: 1. Successful pre Y-90 arteriogram with percutaneous coil embolization of the pancreaticoduodenal artery. 2. Successful split dose administration of a total of 5 mCi of technetium 944mAA via the right hepatic and accessory right hepatic arteries. Awaiting results of nuclear medicine liver scan. PLAN: Pending the results of the nuclear medicine liver scan, the patient will return Y90 radioembolization the right hepatic artery. Pending the patient's recovery from this initial treatment session, the patient will return for radioembolization of the accessory right hepatic artery. Electronically Signed   By: JoSandi Mariscal.D.   On: 12/08/2017 13:45   Ir Angiogram Visceral Selective  Result Date: 12/08/2017 INDICATION: History of multifocal hepatocellular carcinoma. Patient presents today for Y 90 mapping procedure. Please refer to formal consultation in the epic EMR dated 11/17/2017 for additional details. EXAM: 1. ULTRASOUND GUIDANCE FOR ARTERIAL ACCESS 2. CELIAC AND SUPERIOR MESENTERIC ARTERIOGRAM (1st ORDER) 3. SELECTIVE COMMON AND PROPER HEPATIC ARTERIOGRAMS 4. SELECTIVE ARTERIOGRAM OF ACCESSORY PANCREATICODUODENAL ARTERY AND PERCUTANEOUS COIL EMBOLIZATION 5. SELECTIVE ARTERIOGRAM OF ACCESSORY RIGHT HEPATIC ARTERY AND ADMINISTRATION OF Tc998mA 6. SELECTIVE  ARTERIOGRAM OF THE RIGHT HEPATIC ARTERY AND ADMINISTRATION OF Tc72mMAA COMPARISON:  Hepatic bland embolization - 07/25/2017; abdominal MRI - 11/01/2017; CTA of the abdomen pelvis - 07/07/2017 MEDICATIONS: None RADIOPHARMACEUTICALS:  A total of 5 mCi  Tc917mAA was administered, 2 mCi via an accessory right hepatic artery and 3 mCi via the right hepatic artery. CONTRAST:  75 cc Isovue 300 ANESTHESIA/SEDATION: Moderate (conscious) sedation was employed during this procedure. A total of Versed 3 mg and Fentanyl 150 mcg was administered intravenously. Moderate Sedation Time: 59 minutes. The patient's level of consciousness and vital signs were monitored continuously by radiology nursing throughout the procedure under my direct supervision. FLUOROSCOPY TIME:  14 minutes, 48 seconds (584 mGy) ACCESS: Right common femoral artery; hemostasis achieved with manual compression. COMPLICATIONS: None immediate. TECHNIQUE: Informed written consent was obtained from the patient after a discussion of the risks, benefits and alternatives to treatment. Questions regarding the procedure were encouraged and answered. A timeout was performed prior to the initiation of the procedure. The right groin was prepped and draped in the usual sterile fashion, and a sterile drape was applied covering the operative field. Maximum barrier sterile technique with sterile gowns and gloves were used for the procedure. A timeout was performed prior to the initiation of the procedure. Local anesthesia was provided with 1% lidocaine. The right femoral head was marked fluoroscopically. Under ultrasound guidance, the right common femoral artery was accessed with a micropuncture kit after the overlying soft tissues were anesthetized with 1% lidocaine. An ultrasound image was saved for documentation purposes. The micropuncture sheath was exchanged for a 5 [place] sheath over a Bentson wire. A closure arteriogram was performed through the side of the sheath confirming access within the right common femoral artery. Over a Bentson wire, a Mickelson catheter was advanced to the level of the thoracic aorta where it was back bled and flushed. The catheter was then utilized to select the superior  mesenteric artery and a superior mesenteric arteriogram with delayed portal venous imaging was performed. The Mickelson catheter was then advanced cranially and utilized to select the celiac artery and a celiac arteriogram was performed. Utilizing a fathom 14 microwire, a regular renegade micro catheter was utilized to select common and proper hepatic arteries and selective arteriograms were performed. The Renegade microcatheter was utilized to select an accessory pancreaticoduodenal artery noted to arise subjacent to the left hepatic artery. Selective arteriogram was performed confirming appropriate positioning and the vessel was subsequent percutaneously coil embolized with 3 overlapping 2 mm x 5 mm partial coils to near the vessel's origin. The microcatheter was retracted into the proper hepatic artery and a post embolization proper hepatic arteriogram was performed Next, the microcatheter was utilized to select the accessory right hepatic artery and accessory right hepatic arteriogram was performed. From this location, 2 mCi of technetium 99 MAA was administered. Next the microcatheter was advanced into the right hepatic artery and a selective right hepatic arteriogram was performed. From the location proximal to the vessels bifurcation, 3 mCi of technetium 99 MAA was administered. At this point, the procedure was terminated. All wires and catheters and sheaths were removed from the patient. Hemostasis was achieved at the right groin and access site with manual compression. A dressing was placed. The patient tolerated procedure well without immediate postprocedural complication. The patient was escorted to nuclear medicine department for planar imaging. FINDINGS: Selective superior mesenteric arteriogram was again negative for accessory or replaced hepatic arterial supply. Late portal venous phase imaging demonstrates  patency of the main, right and left portal veins. Celiac arteriogram re-demonstrates an  accessory right hepatic artery supplying the medial aspect of the right lobe of the liver. 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 Successful split dose administration of a total of 5 mCi of technetium 39mMAA via the right hepatic and accessory right hepatic arteries. IMPRESSION: 1. Successful pre Y-90 arteriogram with percutaneous coil embolization of the pancreaticoduodenal artery. 2. Successful split dose administration of a total of 5 mCi of technetium 969mAA via the right hepatic and accessory right hepatic arteries. Awaiting results of nuclear medicine liver scan. PLAN: Pending the results of the nuclear medicine liver scan, the patient will return Y90 radioembolization the right hepatic artery. Pending the patient's recovery from this initial treatment session, the patient will return for radioembolization of the accessory right hepatic artery. Electronically Signed   By: JoSandi Mariscal.D.   On: 12/08/2017 13:45   Ir Angiogram Selective Each Additional Vessel  Result Date: 12/08/2017 INDICATION: History of multifocal hepatocellular carcinoma. Patient presents today for Y 90 mapping procedure. Please refer to formal consultation in the epic EMR dated 11/17/2017 for additional details. EXAM: 1. ULTRASOUND GUIDANCE FOR ARTERIAL ACCESS 2. CELIAC AND SUPERIOR MESENTERIC ARTERIOGRAM (1st ORDER) 3. SELECTIVE COMMON AND PROPER HEPATIC ARTERIOGRAMS 4. SELECTIVE ARTERIOGRAM OF ACCESSORY PANCREATICODUODENAL ARTERY AND PERCUTANEOUS COIL EMBOLIZATION 5. SELECTIVE ARTERIOGRAM OF ACCESSORY RIGHT HEPATIC ARTERY AND ADMINISTRATION OF [id number] 6. SELECTIVE ARTERIOGRAM OF THE RIGHT HEPATIC ARTERY AND ADMINISTRATION OF Tc99m6m COMPARISON:  Hepatic bland embolization - 07/25/2017; abdominal MRI - 11/01/2017; CTA of the abdomen pelvis - 07/07/2017 MEDICATIONS: None  RADIOPHARMACEUTICALS:  A total of 5 mCi Tc99m 82mwas administered, 2 mCi via an accessory right hepatic artery and 3 mCi via the right hepatic artery. CONTRAST:  75 cc Isovue 300 ANESTHESIA/SEDATION: Moderate (conscious) sedation was employed during this procedure. A total of Versed 3 mg and Fentanyl 150 mcg was administered intravenously. Moderate Sedation Time: 59 minutes. The patient's level of consciousness and vital signs were monitored continuously by radiology nursing throughout the procedure under my direct supervision. FLUOROSCOPY TIME:  14 minutes, 48 seconds (584 mGy) ACCESS: Right common femoral artery; hemostasis achieved with manual compression. COMPLICATIONS: None immediate. TECHNIQUE: Informed written consent was obtained from the patient after a discussion of the risks, benefits and alternatives to treatment. Questions regarding the procedure were encouraged and answered. A timeout was performed prior to the initiation of the procedure. The right groin was prepped and draped in the usual sterile fashion, and a sterile drape was applied covering the operative field. Maximum barrier sterile technique with sterile gowns and gloves were used for the procedure. A timeout was performed prior to the initiation of the procedure. Local anesthesia was provided with 1% lidocaine. The right femoral head was marked fluoroscopically. Under ultrasound guidance, the right common femoral artery was accessed with a micropuncture kit after the overlying soft tissues were anesthetized with 1% lidocaine. An ultrasound image was saved for documentation purposes. The micropuncture sheath was exchanged for a 5 [place] sheath over a Bentson wire. A closure arteriogram was performed through the side of the sheath confirming access within the right common femoral artery. Over a Bentson wire, a Mickelson catheter was advanced to the level of the thoracic aorta where it was back bled and flushed. The catheter was then  utilized  to select the superior mesenteric artery and a superior mesenteric arteriogram with delayed portal venous imaging was performed. The Mickelson catheter was then advanced cranially and utilized to select the celiac artery and a celiac arteriogram was performed. Utilizing a fathom 14 microwire, a regular renegade micro catheter was utilized to select common and proper hepatic arteries and selective arteriograms were performed. The Renegade microcatheter was utilized to select an accessory pancreaticoduodenal artery noted to arise subjacent to the left hepatic artery. Selective arteriogram was performed confirming appropriate positioning and the vessel was subsequent percutaneously coil embolized with 3 overlapping 2 mm x 5 mm partial coils to near the vessel's origin. The microcatheter was retracted into the proper hepatic artery and a post embolization proper hepatic arteriogram was performed Next, the microcatheter was utilized to select the accessory right hepatic artery and accessory right hepatic arteriogram was performed. From this location, 2 mCi of technetium 99 MAA was administered. Next the microcatheter was advanced into the right hepatic artery and a selective right hepatic arteriogram was performed. From the location proximal to the vessels bifurcation, 3 mCi of technetium 99 MAA was administered. At this point, the procedure was terminated. All wires and catheters and sheaths were removed from the patient. Hemostasis was achieved at the right groin and access site with manual compression. A dressing was placed. The patient tolerated procedure well without immediate postprocedural complication. The patient was escorted to nuclear medicine department for planar imaging. FINDINGS: Selective superior mesenteric arteriogram was again negative for accessory or replaced hepatic arterial supply. Late portal venous phase imaging demonstrates patency of the main, right and left portal veins. Celiac  arteriogram re-demonstrates an accessory right hepatic artery supplying the medial aspect of the right lobe of the liver. 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 Successful split dose administration of a total of 5 mCi of technetium 33mMAA via the right hepatic and accessory right hepatic arteries. IMPRESSION: 1. Successful pre Y-90 arteriogram with percutaneous coil embolization of the pancreaticoduodenal artery. 2. Successful split dose administration of a total of 5 mCi of technetium 985mAA via the right hepatic and accessory right hepatic arteries. Awaiting results of nuclear medicine liver scan. PLAN: Pending the results of the nuclear medicine liver scan, the patient will return Y90 radioembolization the right hepatic artery. Pending the patient's recovery from this initial treatment session, the patient will return for radioembolization of the accessory right hepatic artery. Electronically Signed   By: JoSandi Mariscal.D.   On: 12/08/2017 13:45   Ir Angiogram Selective Each Additional Vessel  Result Date: 12/08/2017 INDICATION: History of multifocal hepatocellular carcinoma. Patient presents today for Y 90 mapping procedure. Please refer to formal consultation in the epic EMR dated 11/17/2017 for additional details. EXAM: 1. ULTRASOUND GUIDANCE FOR ARTERIAL ACCESS 2. CELIAC AND SUPERIOR MESENTERIC ARTERIOGRAM (1st ORDER) 3. SELECTIVE COMMON AND PROPER HEPATIC ARTERIOGRAMS 4. SELECTIVE ARTERIOGRAM OF ACCESSORY PANCREATICODUODENAL ARTERY AND PERCUTANEOUS COIL EMBOLIZATION 5. SELECTIVE ARTERIOGRAM OF ACCESSORY RIGHT HEPATIC ARTERY AND ADMINISTRATION OF [id number] 6. SELECTIVE ARTERIOGRAM OF THE RIGHT HEPATIC ARTERY AND ADMINISTRATION OF Tc99m49m COMPARISON:  Hepatic bland embolization - 07/25/2017; abdominal MRI - 11/01/2017; CTA of the abdomen pelvis -  07/07/2017 MEDICATIONS: None RADIOPHARMACEUTICALS:  A total of 5 mCi Tc99m 62mwas administered, 2 mCi via an accessory right hepatic artery and 3 mCi via the right hepatic artery. CONTRAST:  75 cc Isovue 300 ANESTHESIA/SEDATION: Moderate (conscious) sedation was employed during this procedure. A total of Versed 3 mg and Fentanyl 150 mcg was administered intravenously. Moderate Sedation Time: 59 minutes. The patient's level of consciousness and vital signs were monitored continuously by radiology nursing throughout the procedure under my direct supervision. FLUOROSCOPY TIME:  14 minutes, 48 seconds (584 mGy) ACCESS: Right common femoral artery; hemostasis achieved with manual compression. COMPLICATIONS: None immediate. TECHNIQUE: Informed written consent was obtained from the patient after a discussion of the risks, benefits and alternatives to treatment. Questions regarding the procedure were encouraged and answered. A timeout was performed prior to the initiation of the procedure. The right groin was prepped and draped in the usual sterile fashion, and a sterile drape was applied covering the operative field. Maximum barrier sterile technique with sterile gowns and gloves were used for the procedure. A timeout was performed prior to the initiation of the procedure. Local anesthesia was provided with 1% lidocaine. The right femoral head was marked fluoroscopically. Under ultrasound guidance, the right common femoral artery was accessed with a micropuncture kit after the overlying soft tissues were anesthetized with 1% lidocaine. An ultrasound image was saved for documentation purposes. The micropuncture sheath was exchanged for a 5 [place] vascular sheath over a Bentson wire. A closure arteriogram was performed through the side of the sheath confirming access within the right common femoral artery. Over a Bentson wire, a Mickelson catheter was advanced to the level of the thoracic aorta where it was back bled and  flushed. The catheter was then utilized to select the superior mesenteric artery and a superior mesenteric arteriogram with delayed portal venous imaging was performed. The Mickelson catheter was then advanced cranially and utilized to select the celiac artery and a celiac arteriogram was performed. Utilizing a fathom 14 microwire, a regular renegade micro catheter was utilized to select common and proper hepatic arteries and selective arteriograms were performed. The Renegade microcatheter was utilized to select an accessory pancreaticoduodenal artery noted to arise subjacent to the left hepatic artery. Selective arteriogram was performed confirming appropriate positioning and the vessel was subsequent percutaneously coil embolized with 3 overlapping 2 mm x 5 mm partial coils to near the vessel's origin. The microcatheter was retracted into the proper hepatic artery and a post embolization proper hepatic arteriogram was performed Next, the microcatheter was utilized to select the accessory right hepatic artery and accessory right hepatic arteriogram was performed. From this location, 2 mCi of technetium 99 MAA was administered. Next the microcatheter was advanced into the right hepatic artery and a selective right hepatic arteriogram was performed. From the location proximal to the vessels bifurcation, 3 mCi of technetium 99 MAA was administered. At this point, the procedure was terminated. All wires and catheters and sheaths were removed from the patient. Hemostasis was achieved at the right groin and access site with manual compression. A dressing was placed. The patient tolerated procedure well without immediate postprocedural complication. The patient was escorted to nuclear medicine department for planar imaging. FINDINGS: Selective superior mesenteric arteriogram was again negative for accessory or replaced hepatic arterial supply. Late portal venous phase imaging demonstrates patency of the main, right and  left portal veins. Celiac arteriogram re-demonstrates an accessory right hepatic artery supplying the medial aspect of the right lobe of the liver. 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 Successful split  dose administration of a total of 5 mCi of technetium 70mMAA via the right hepatic and accessory right hepatic arteries. IMPRESSION: 1. Successful pre Y-90 arteriogram with percutaneous coil embolization of the pancreaticoduodenal artery. 2. Successful split dose administration of a total of 5 mCi of technetium 923mAA via the right hepatic and accessory right hepatic arteries. Awaiting results of nuclear medicine liver scan. PLAN: Pending the results of the nuclear medicine liver scan, the patient will return Y90 radioembolization the right hepatic artery. Pending the patient's recovery from this initial treatment session, the patient will return for radioembolization of the accessory right hepatic artery. Electronically Signed   By: JoSandi Mariscal.D.   On: 12/08/2017 13:45   Ir Angiogram Selective Each Additional Vessel  Result Date: 12/08/2017 INDICATION: History of multifocal hepatocellular carcinoma. Patient presents today for Y 90 mapping procedure. Please refer to formal consultation in the epic EMR dated 11/17/2017 for additional details. EXAM: 1. ULTRASOUND GUIDANCE FOR ARTERIAL ACCESS 2. CELIAC AND SUPERIOR MESENTERIC ARTERIOGRAM (1st ORDER) 3. SELECTIVE COMMON AND PROPER HEPATIC ARTERIOGRAMS 4. SELECTIVE ARTERIOGRAM OF ACCESSORY PANCREATICODUODENAL ARTERY AND PERCUTANEOUS COIL EMBOLIZATION 5. SELECTIVE ARTERIOGRAM OF ACCESSORY RIGHT HEPATIC ARTERY AND ADMINISTRATION OF [id number] 6. SELECTIVE ARTERIOGRAM OF THE RIGHT HEPATIC ARTERY AND ADMINISTRATION OF Tc99m21m COMPARISON:  Hepatic bland embolization - 07/25/2017; abdominal MRI - 11/01/2017; CTA  of the abdomen pelvis - 07/07/2017 MEDICATIONS: None RADIOPHARMACEUTICALS:  A total of 5 mCi Tc99m 75mwas administered, 2 mCi via an accessory right hepatic artery and 3 mCi via the right hepatic artery. CONTRAST:  75 cc Isovue 300 ANESTHESIA/SEDATION: Moderate (conscious) sedation was employed during this procedure. A total of Versed 3 mg and Fentanyl 150 mcg was administered intravenously. Moderate Sedation Time: 59 minutes. The patient's level of consciousness and vital signs were monitored continuously by radiology nursing throughout the procedure under my direct supervision. FLUOROSCOPY TIME:  14 minutes, 48 seconds (584 mGy) ACCESS: Right common femoral artery; hemostasis achieved with manual compression. COMPLICATIONS: None immediate. TECHNIQUE: Informed written consent was obtained from the patient after a discussion of the risks, benefits and alternatives to treatment. Questions regarding the procedure were encouraged and answered. A timeout was performed prior to the initiation of the procedure. The right groin was prepped and draped in the usual sterile fashion, and a sterile drape was applied covering the operative field. Maximum barrier sterile technique with sterile gowns and gloves were used for the procedure. A timeout was performed prior to the initiation of the procedure. Local anesthesia was provided with 1% lidocaine. The right femoral head was marked fluoroscopically. Under ultrasound guidance, the right common femoral artery was accessed with a micropuncture kit after the overlying soft tissues were anesthetized with 1% lidocaine. An ultrasound image was saved for documentation purposes. The micropuncture sheath was exchanged for a 5 [place] sheath over a Bentson wire. A closure arteriogram was performed through the side of the sheath confirming access within the right common femoral artery. Over a Bentson wire, a Mickelson catheter was advanced to the level of the thoracic aorta where  it was back bled and flushed. The catheter was then utilized to select the superior mesenteric artery and a superior mesenteric arteriogram with delayed portal venous imaging was performed. The Mickelson catheter was then advanced cranially and utilized to select the celiac artery and a celiac arteriogram was performed. Utilizing a fathom 14 microwire, a regular renegade micro catheter was utilized to select common and proper hepatic arteries and selective arteriograms were  performed. The Renegade microcatheter was utilized to select an accessory pancreaticoduodenal artery noted to arise subjacent to the left hepatic artery. Selective arteriogram was performed confirming appropriate positioning and the vessel was subsequent percutaneously coil embolized with 3 overlapping 2 mm x 5 mm partial coils to near the vessel's origin. The microcatheter was retracted into the proper hepatic artery and a post embolization proper hepatic arteriogram was performed Next, the microcatheter was utilized to select the accessory right hepatic artery and accessory right hepatic arteriogram was performed. From this location, 2 mCi of technetium 99 MAA was administered. Next the microcatheter was advanced into the right hepatic artery and a selective right hepatic arteriogram was performed. From the location proximal to the vessels bifurcation, 3 mCi of technetium 99 MAA was administered. At this point, the procedure was terminated. All wires and catheters and sheaths were removed from the patient. Hemostasis was achieved at the right groin and access site with manual compression. A dressing was placed. The patient tolerated procedure well without immediate postprocedural complication. The patient was escorted to nuclear medicine department for planar imaging. FINDINGS: Selective superior mesenteric arteriogram was again negative for accessory or replaced hepatic arterial supply. Late portal venous phase imaging demonstrates patency of  the main, right and left portal veins. Celiac arteriogram re-demonstrates an accessory right hepatic artery supplying the medial aspect of the right lobe of the liver. 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 Successful split dose administration of a total of 5 mCi of technetium 13mMAA via the right hepatic and accessory right hepatic arteries. IMPRESSION: 1. Successful pre Y-90 arteriogram with percutaneous coil embolization of the pancreaticoduodenal artery. 2. Successful split dose administration of a total of 5 mCi of technetium 965mAA via the right hepatic and accessory right hepatic arteries. Awaiting results of nuclear medicine liver scan. PLAN: Pending the results of the nuclear medicine liver scan, the patient will return Y90 radioembolization the right hepatic artery. Pending the patient's recovery from this initial treatment session, the patient will return for radioembolization of the accessory right hepatic artery. Electronically Signed   By: JoSandi Mariscal.D.   On: 12/08/2017 13:45   Ir Angiogram Selective Each Additional Vessel  Result Date: 12/08/2017 INDICATION: History of multifocal hepatocellular carcinoma. Patient presents today for Y 90 mapping procedure. Please refer to formal consultation in the epic EMR dated 11/17/2017 for additional details. EXAM: 1. ULTRASOUND GUIDANCE FOR ARTERIAL ACCESS 2. CELIAC AND SUPERIOR MESENTERIC ARTERIOGRAM (1st ORDER) 3. SELECTIVE COMMON AND PROPER HEPATIC ARTERIOGRAMS 4. SELECTIVE ARTERIOGRAM OF ACCESSORY PANCREATICODUODENAL ARTERY AND PERCUTANEOUS COIL EMBOLIZATION 5. SELECTIVE ARTERIOGRAM OF ACCESSORY RIGHT HEPATIC ARTERY AND ADMINISTRATION OF [id number] 6. SELECTIVE ARTERIOGRAM OF THE RIGHT HEPATIC ARTERY AND ADMINISTRATION OF Tc99m72m COMPARISON:  Hepatic bland embolization - 07/25/2017; abdominal MRI  - 11/01/2017; CTA of the abdomen pelvis - 07/07/2017 MEDICATIONS: None RADIOPHARMACEUTICALS:  A total of 5 mCi Tc99m 75mwas administered, 2 mCi via an accessory right hepatic artery and 3 mCi via the right hepatic artery. CONTRAST:  75 cc Isovue 300 ANESTHESIA/SEDATION: Moderate (conscious) sedation was employed during this procedure. A total of Versed 3 mg and Fentanyl 150 mcg was administered intravenously. Moderate Sedation Time: 59 minutes. The patient's level of consciousness and vital signs were monitored continuously by radiology nursing throughout the procedure under my direct supervision. FLUOROSCOPY TIME:  14 minutes, 48 seconds (584  mGy) ACCESS: Right common femoral artery; hemostasis achieved with manual compression. COMPLICATIONS: None immediate. TECHNIQUE: Informed written consent was obtained from the patient after a discussion of the risks, benefits and alternatives to treatment. Questions regarding the procedure were encouraged and answered. A timeout was performed prior to the initiation of the procedure. The right groin was prepped and draped in the usual sterile fashion, and a sterile drape was applied covering the operative field. Maximum barrier sterile technique with sterile gowns and gloves were used for the procedure. A timeout was performed prior to the initiation of the procedure. Local anesthesia was provided with 1% lidocaine. The right femoral head was marked fluoroscopically. Under ultrasound guidance, the right common femoral artery was accessed with a micropuncture kit after the overlying soft tissues were anesthetized with 1% lidocaine. An ultrasound image was saved for documentation purposes. The micropuncture sheath was exchanged for a 5 [place] vascular sheath over a Bentson wire. A closure arteriogram was performed through the side of the sheath confirming access within the right common femoral artery. Over a Bentson wire, a Mickelson catheter was advanced to the level of the  thoracic aorta where it was back bled and flushed. The catheter was then utilized to select the superior mesenteric artery and a superior mesenteric arteriogram with delayed portal venous imaging was performed. The Mickelson catheter was then advanced cranially and utilized to select the celiac artery and a celiac arteriogram was performed. Utilizing a fathom 14 microwire, a regular renegade micro catheter was utilized to select common and proper hepatic arteries and selective arteriograms were performed. The Renegade microcatheter was utilized to select an accessory pancreaticoduodenal artery noted to arise subjacent to the left hepatic artery. Selective arteriogram was performed confirming appropriate positioning and the vessel was subsequent percutaneously coil embolized with 3 overlapping 2 mm x 5 mm partial coils to near the vessel's origin. The microcatheter was retracted into the proper hepatic artery and a post embolization proper hepatic arteriogram was performed Next, the microcatheter was utilized to select the accessory right hepatic artery and accessory right hepatic arteriogram was performed. From this location, 2 mCi of technetium 99 MAA was administered. Next the microcatheter was advanced into the right hepatic artery and a selective right hepatic arteriogram was performed. From the location proximal to the vessels bifurcation, 3 mCi of technetium 99 MAA was administered. At this point, the procedure was terminated. All wires and catheters and sheaths were removed from the patient. Hemostasis was achieved at the right groin and access site with manual compression. A dressing was placed. The patient tolerated procedure well without immediate postprocedural complication. The patient was escorted to nuclear medicine department for planar imaging. FINDINGS: Selective superior mesenteric arteriogram was again negative for accessory or replaced hepatic arterial supply. Late portal venous phase imaging  demonstrates patency of the main, right and left portal veins. Celiac arteriogram re-demonstrates an accessory right hepatic artery supplying the medial aspect of the right lobe of the liver. 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 Successful split dose administration of a total of 5 mCi of technetium 49mMAA via the right hepatic and accessory right hepatic arteries. IMPRESSION: 1. Successful pre Y-90 arteriogram with percutaneous coil embolization of the pancreaticoduodenal artery. 2. Successful split dose administration of a total of 5 mCi of technetium 934mAA via the right hepatic and accessory right hepatic arteries. Awaiting  results of nuclear medicine liver scan. PLAN: Pending the results of the nuclear medicine liver scan, the patient will return Y90 radioembolization the right hepatic artery. Pending the patient's recovery from this initial treatment session, the patient will return for radioembolization of the accessory right hepatic artery. Electronically Signed   By: Sandi Mariscal M.D.   On: 12/08/2017 13:45   Ir Angiogram Selective Each Additional Vessel  Result Date: 12/08/2017 INDICATION: History of multifocal hepatocellular carcinoma. Patient presents today for Y 90 mapping procedure. Please refer to formal consultation in the epic EMR dated 11/17/2017 for additional details. EXAM: 1. ULTRASOUND GUIDANCE FOR ARTERIAL ACCESS 2. CELIAC AND SUPERIOR MESENTERIC ARTERIOGRAM (1st ORDER) 3. SELECTIVE COMMON AND PROPER HEPATIC ARTERIOGRAMS 4. SELECTIVE ARTERIOGRAM OF ACCESSORY PANCREATICODUODENAL ARTERY AND PERCUTANEOUS COIL EMBOLIZATION 5. SELECTIVE ARTERIOGRAM OF ACCESSORY RIGHT HEPATIC ARTERY AND ADMINISTRATION OF Tc68mMAA 6. SELECTIVE ARTERIOGRAM OF THE RIGHT HEPATIC ARTERY AND ADMINISTRATION OF Tc944mAA COMPARISON:  Hepatic bland embolization -  07/25/2017; abdominal MRI - 11/01/2017; CTA of the abdomen pelvis - 07/07/2017 MEDICATIONS: None RADIOPHARMACEUTICALS:  A total of 5 mCi Tc9973mA was administered, 2 mCi via an accessory right hepatic artery and 3 mCi via the right hepatic artery. CONTRAST:  75 cc Isovue 300 ANESTHESIA/SEDATION: Moderate (conscious) sedation was employed during this procedure. A total of Versed 3 mg and Fentanyl 150 mcg was administered intravenously. Moderate Sedation Time: 59 minutes. The patient's level of consciousness and vital signs were monitored continuously by radiology nursing throughout the procedure under my direct supervision. FLUOROSCOPY TIME:  14 minutes, 48 seconds (584 mGy) ACCESS: Right common femoral artery; hemostasis achieved with manual compression. COMPLICATIONS: None immediate. TECHNIQUE: Informed written consent was obtained from the patient after a discussion of the risks, benefits and alternatives to treatment. Questions regarding the procedure were encouraged and answered. A timeout was performed prior to the initiation of the procedure. The right groin was prepped and draped in the usual sterile fashion, and a sterile drape was applied covering the operative field. Maximum barrier sterile technique with sterile gowns and gloves were used for the procedure. A timeout was performed prior to the initiation of the procedure. Local anesthesia was provided with 1% lidocaine. The right femoral head was marked fluoroscopically. Under ultrasound guidance, the right common femoral artery was accessed with a micropuncture kit after the overlying soft tissues were anesthetized with 1% lidocaine. An ultrasound image was saved for documentation purposes. The micropuncture sheath was exchanged for a 5 FrePakistanscular sheath over a Bentson wire. A closure arteriogram was performed through the side of the sheath confirming access within the right common femoral artery. Over a Bentson wire, a Mickelson catheter was  advanced to the level of the thoracic aorta where it was back bled and flushed. The catheter was then utilized to select the superior mesenteric artery and a superior mesenteric arteriogram with delayed portal venous imaging was performed. The Mickelson catheter was then advanced cranially and utilized to select the celiac artery and a celiac arteriogram was performed. Utilizing a fathom 14 microwire, a regular renegade micro catheter was utilized to select common and proper hepatic arteries and selective arteriograms were performed. The Renegade microcatheter was utilized to select an accessory pancreaticoduodenal artery noted to arise subjacent to the left hepatic artery. Selective arteriogram was performed confirming appropriate positioning and the vessel was subsequent percutaneously coil embolized with 3 overlapping 2 mm x 5 mm partial coils to near the vessel's origin. The microcatheter was retracted into the proper hepatic artery  and a post embolization proper hepatic arteriogram was performed Next, the microcatheter was utilized to select the accessory right hepatic artery and accessory right hepatic arteriogram was performed. From this location, 2 mCi of technetium 99 MAA was administered. Next the microcatheter was advanced into the right hepatic artery and a selective right hepatic arteriogram was performed. From the location proximal to the vessels bifurcation, 3 mCi of technetium 99 MAA was administered. At this point, the procedure was terminated. All wires and catheters and sheaths were removed from the patient. Hemostasis was achieved at the right groin and access site with manual compression. A dressing was placed. The patient tolerated procedure well without immediate postprocedural complication. The patient was escorted to nuclear medicine department for planar imaging. FINDINGS: Selective superior mesenteric arteriogram was again negative for accessory or replaced hepatic arterial supply. Late  portal venous phase imaging demonstrates patency of the main, right and left portal veins. Celiac arteriogram re-demonstrates an accessory right hepatic artery supplying the medial aspect of the right lobe of the liver. 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 Successful split dose administration of a total of 5 mCi of technetium 43mMAA via the right hepatic and accessory right hepatic arteries. IMPRESSION: 1. Successful pre Y-90 arteriogram with percutaneous coil embolization of the pancreaticoduodenal artery. 2. Successful split dose administration of a total of 5 mCi of technetium 964mAA via the right hepatic and accessory right hepatic arteries. Awaiting results of nuclear medicine liver scan. PLAN: Pending the results of the nuclear medicine liver scan, the patient will return Y90 radioembolization the right hepatic artery. Pending the patient's recovery from this initial treatment session, the patient will return for radioembolization of the accessory right hepatic artery. Electronically Signed   By: JoSandi Mariscal.D.   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 COMPARISON:  CT abdomen and pelvis - 12/15/2017; 12/08/2017 FINDINGS: There is an approximately 3.7 x 3.3 x 5.0 cm mixed echogenic hematoma within the right groin, minimally decreased in size compared to the 12/15/2017 abdominal CT, previously, 4.0 x 3.5 x 5.4 cm. 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 The neck of the aneurysm measures approximately 3 mm in diameter and extends for a length of approximately 0.5 cm (images 1 and 2, series 2). The overall size of the opacified portion of the aneurysm measures approximately 1.5 x 1.2 cm, unchanged to slightly decreased in size compared to abdominal CT performed 12/15/2017 previously, 2.4 x 1.6 x 1.3 cm. IMPRESSION: Slight reduction in size of now approximately 5 cm mixed echogenic hematoma within the right groin with suspected slight decrease in size of persistent small pseudoaneurysm as detailed above. Electronically Signed   By: JoSandi Mariscal.D.   On: 12/21/2017 11:41   Ir Fluoro Rm 30-60 Min  Result Date: 12/09/2017 CLINICAL DATA:  Patient underwent technically successful mapping Y 90 radioembolization earlier today however prior to discharge developed  acute onset of right lower abdominal pain. Subsequent CTA demonstrated small area of persistent extravasation at the right common femoral access site. Please perform fluoroscopic assisted localization of the access site of the right common femoral artery prior to continued manual compression in hopes of achieving hemostasis. EXAM: IR FLOURO RM 0-60 MIN TECHNIQUE: Patient was placed supine on the fluoroscopy table and the approximate location of the right common femoral arterial access site was marked fluoroscopically. Next, approximately 30 minutes of additional manual compression was performed at this location. Patient reports subjective improvement in his right lower abdominal pain and has remained hemodynamically stable throughout this prolonged observation in interventional radiology department. CONTRAST:  None FLUOROSCOPY TIME:  12 seconds COMPARISON:  Mapping Y 90 radioembolization - earlier same day; CT abdomen pelvis - earlier same day FINDINGS: Patient was  placed supine on the fluoroscopy table and the approximate location of the right common femoral arterial access site was marked fluoroscopically. Next, approximately 30 minutes of additional manual compression was performed at this location. Patient reports subjective improvement in his right lower abdominal pain and has remained hemodynamically stable throughout this prolonged observation in interventional radiology department. IMPRESSION: Successful fluoroscopic guided marking of the right femoral head followed by additional 30 minutes of manual compression at the right common femoral arterial access site. Electronically Signed   By: Sandi Mariscal M.D.   On: 12/09/2017 13:48   Ir US Guide Vasc Access Right  Result Date: 12/08/2017 INDICATION: History of multifocal hepatocellular carcinoma. Patient presents today for Y 90 mapping procedure. Please refer to formal consultation in the epic EMR dated 11/17/2017 for additional details. EXAM: 1. ULTRASOUND GUIDANCE FOR ARTERIAL ACCESS 2. CELIAC AND SUPERIOR MESENTERIC ARTERIOGRAM (1st ORDER) 3. SELECTIVE COMMON AND PROPER HEPATIC ARTERIOGRAMS 4. SELECTIVE ARTERIOGRAM OF ACCESSORY PANCREATICODUODENAL ARTERY AND PERCUTANEOUS COIL EMBOLIZATION 5. SELECTIVE ARTERIOGRAM OF ACCESSORY RIGHT HEPATIC ARTERY AND ADMINISTRATION OF Tc14mMAA 6. SELECTIVE ARTERIOGRAM OF THE RIGHT HEPATIC ARTERY AND ADMINISTRATION OF Tc930mAA COMPARISON:  Hepatic bland embolization - 07/25/2017; abdominal MRI - 11/01/2017; CTA of the abdomen pelvis - 07/07/2017 MEDICATIONS: None RADIOPHARMACEUTICALS:  A total of 5 mCi Tc9969mA was administered, 2 mCi via an accessory right hepatic artery and 3 mCi via the right hepatic artery. CONTRAST:  75 cc Isovue 300 ANESTHESIA/SEDATION: Moderate (conscious) sedation was employed during this procedure. A total of Versed 3 mg and Fentanyl 150 mcg was administered intravenously. Moderate Sedation Time: 59 minutes. The patient's level of consciousness and vital  signs were monitored continuously by radiology nursing throughout the procedure under my direct supervision. FLUOROSCOPY TIME:  14 minutes, 48 seconds (584 mGy) ACCESS: Right common femoral artery; hemostasis achieved with manual compression. COMPLICATIONS: None immediate. TECHNIQUE: Informed written consent was obtained from the patient after a discussion of the risks, benefits and alternatives to treatment. Questions regarding the procedure were encouraged and answered. A timeout was performed prior to the initiation of the procedure. The right groin was prepped and draped in the usual sterile fashion, and a sterile drape was applied covering the operative field. Maximum barrier sterile technique with sterile gowns and gloves were used for the procedure. A timeout was performed prior to the initiation of the procedure. Local anesthesia was provided with 1% lidocaine. The right femoral head was marked fluoroscopically. Under ultrasound guidance, the right common femoral artery was accessed with a micropuncture kit after the overlying soft tissues were anesthetized with 1% lidocaine. An ultrasound image was saved for documentation purposes. The micropuncture sheath was exchanged for  a 5 [place] vascular sheath over a Bentson wire. A closure arteriogram was performed through the side of the sheath confirming access within the right common femoral artery. Over a Bentson wire, a Mickelson catheter was advanced to the level of the thoracic aorta where it was back bled and flushed. The catheter was then utilized to select the superior mesenteric artery and a superior mesenteric arteriogram with delayed portal venous imaging was performed. The Mickelson catheter was then advanced cranially and utilized to select the celiac artery and a celiac arteriogram was performed. Utilizing a fathom 14 microwire, a regular renegade micro catheter was utilized to select common and proper hepatic arteries and selective arteriograms were  performed. The Renegade microcatheter was utilized to select an accessory pancreaticoduodenal artery noted to arise subjacent to the left hepatic artery. Selective arteriogram was performed confirming appropriate positioning and the vessel was subsequent percutaneously coil embolized with 3 overlapping 2 mm x 5 mm partial coils to near the vessel's origin. The microcatheter was retracted into the proper hepatic artery and a post embolization proper hepatic arteriogram was performed Next, the microcatheter was utilized to select the accessory right hepatic artery and accessory right hepatic arteriogram was performed. From this location, 2 mCi of technetium 99 MAA was administered. Next the microcatheter was advanced into the right hepatic artery and a selective right hepatic arteriogram was performed. From the location proximal to the vessels bifurcation, 3 mCi of technetium 99 MAA was administered. At this point, the procedure was terminated. All wires and catheters and sheaths were removed from the patient. Hemostasis was achieved at the right groin and access site with manual compression. A dressing was placed. The patient tolerated procedure well without immediate postprocedural complication. The patient was escorted to nuclear medicine department for planar imaging. FINDINGS: Selective superior mesenteric arteriogram was again negative for accessory or replaced hepatic arterial supply. Late portal venous phase imaging demonstrates patency of the main, right and left portal veins. Celiac arteriogram re-demonstrates an accessory right hepatic artery supplying the medial aspect of the right lobe of the liver. 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 Successful split dose administration of a total of 5 mCi of technetium 38mMAA via the right hepatic and accessory  right hepatic arteries. IMPRESSION: 1. Successful pre Y-90 arteriogram with percutaneous coil embolization of the pancreaticoduodenal artery. 2. Successful split dose administration of a total of 5 mCi of technetium 972mAA via the right hepatic and accessory right hepatic arteries. Awaiting results of nuclear medicine liver scan. PLAN: Pending the results of the nuclear medicine liver scan, the patient will return Y90 radioembolization the right hepatic artery. Pending the patient's recovery from this initial treatment session, the patient will return for radioembolization of the accessory right hepatic artery. Electronically Signed   By: JoSandi Mariscal.D.   On: 12/08/2017 13:45   Dg Foot Complete Left  Result Date: 12/15/2017 CLINICAL DATA:  Left foot pain and swelling beginning yesterday. No known injury. EXAM: LEFT FOOT - COMPLETE 3+ VIEW COMPARISON:  1November 18, 202017 FINDINGS: Nonspecific soft tissue swelling in the region of the MTP joint of the great toe. No evidence of erosion. The remainder the exam is negative. Question gout. Arterial calcification incidentally noted. IMPRESSION: Nonspecific soft tissue swelling in the region of the MTP joint of the great toe at could be seen with gout. No osseous finding however.  Electronically Signed   By: Nelson Chimes M.D.   On: 12/15/2017 17:09   Ir Embo Arterial Not Badger Guide Roadmapping  Result Date: 12/08/2017 INDICATION: History of multifocal hepatocellular carcinoma. Patient presents today for Y 90 mapping procedure. Please refer to formal consultation in the epic EMR dated 11/17/2017 for additional details. EXAM: 1. ULTRASOUND GUIDANCE FOR ARTERIAL ACCESS 2. CELIAC AND SUPERIOR MESENTERIC ARTERIOGRAM (1st ORDER) 3. SELECTIVE COMMON AND PROPER HEPATIC ARTERIOGRAMS 4. SELECTIVE ARTERIOGRAM OF ACCESSORY PANCREATICODUODENAL ARTERY AND PERCUTANEOUS COIL EMBOLIZATION 5. SELECTIVE ARTERIOGRAM OF ACCESSORY RIGHT HEPATIC ARTERY AND ADMINISTRATION OF Tc91m MAA 6. SELECTIVE ARTERIOGRAM OF THE RIGHT HEPATIC ARTERY AND ADMINISTRATION OF Tc917mAA COMPARISON:  Hepatic bland embolization - 07/25/2017; abdominal MRI - 11/01/2017; CTA of the abdomen pelvis - 07/07/2017 MEDICATIONS: None RADIOPHARMACEUTICALS:  A total of 5 mCi Tc9938mA was administered, 2 mCi via an accessory right hepatic artery and 3 mCi via the right hepatic artery. CONTRAST:  75 cc Isovue 300 ANESTHESIA/SEDATION: Moderate (conscious) sedation was employed during this procedure. A total of Versed 3 mg and Fentanyl 150 mcg was administered intravenously. Moderate Sedation Time: 59 minutes. The patient's level of consciousness and vital signs were monitored continuously by radiology nursing throughout the procedure under my direct supervision. FLUOROSCOPY TIME:  14 minutes, 48 seconds (584 mGy) ACCESS: Right common femoral artery; hemostasis achieved with manual compression. COMPLICATIONS: None immediate. TECHNIQUE: Informed written consent was obtained from the patient after a discussion of the risks, benefits and alternatives to treatment. Questions regarding the procedure were encouraged and answered. A timeout was performed prior to the initiation of the procedure. The right groin was prepped and draped in the usual sterile fashion, and a sterile drape was applied covering the operative field. Maximum barrier sterile technique with sterile gowns and gloves were used for the procedure. A timeout was performed prior to the initiation of the procedure. Local anesthesia was provided with 1% lidocaine. The right femoral head was marked fluoroscopically. Under ultrasound guidance, the right common femoral artery was accessed with a micropuncture kit after the overlying soft tissues were anesthetized with 1% lidocaine. An ultrasound image was saved for documentation purposes. The micropuncture sheath was exchanged for a 5 Fre[place]ist[place]cular sheath over a Bentson wire. A closure arteriogram was performed through  the side of the sheath confirming access within the right common femoral artery. Over a Bentson wire, a Mickelson catheter was advanced to the level of the thoracic aorta where it was back bled and flushed. The catheter was then utilized to select the superior mesenteric artery and a superior mesenteric arteriogram with delayed portal venous imaging was performed. The Mickelson catheter was then advanced cranially and utilized to select the celiac artery and a celiac arteriogram was performed. Utilizing a fathom 14 microwire, a regular renegade micro catheter was utilized to select common and proper hepatic arteries and selective arteriograms were performed. The Renegade microcatheter was utilized to select an accessory pancreaticoduodenal artery noted to arise subjacent to the left hepatic artery. Selective arteriogram was performed confirming appropriate positioning and the vessel was subsequent percutaneously coil embolized with 3 overlapping 2 mm x 5 mm partial coils to near the vessel's origin. The microcatheter was retracted into the proper hepatic artery and a post embolization proper hepatic arteriogram was performed Next, the microcatheter was utilized to select the accessory right hepatic artery and accessory right hepatic arteriogram was performed. From this location, 2 mCi of technetium 99 MAA was administered. Next the microcatheter was advanced  into the right hepatic artery and a selective right hepatic arteriogram was performed. From the location proximal to the vessels bifurcation, 3 mCi of technetium 99 MAA was administered. At this point, the procedure was terminated. All wires and catheters and sheaths were removed from the patient. Hemostasis was achieved at the right groin and access site with manual compression. A dressing was placed. The patient tolerated procedure well without immediate postprocedural complication. The patient was escorted to nuclear medicine department for planar imaging.  FINDINGS: Selective superior mesenteric arteriogram was again negative for accessory or replaced hepatic arterial supply. Late portal venous phase imaging demonstrates patency of the main, right and left portal veins. Celiac arteriogram re-demonstrates an accessory right hepatic artery supplying the medial aspect of the right lobe of the liver. 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 Successful split dose administration of a total of 5 mCi of technetium 65mMAA via the right hepatic and accessory right hepatic arteries. IMPRESSION: 1. Successful pre Y-90 arteriogram with percutaneous coil embolization of the pancreaticoduodenal artery. 2. Successful split dose administration of a total of 5 mCi of technetium 957mAA via the right hepatic and accessory right hepatic arteries. Awaiting results of nuclear medicine liver scan. PLAN: Pending the results of the nuclear medicine liver scan, the patient will return Y90 radioembolization the right hepatic artery. Pending the patient's recovery from this initial treatment session, the patient will return for radioembolization of the accessory right hepatic artery. Electronically Signed   By: JoSandi Mariscal.D.   On: 12/08/2017 13:45   Ir Radiologist Eval & Mgmt  Result Date: 12/21/2017 Please refer to notes tab for details about interventional procedure. (Op Note)  Ct Angio Abd/pel W/ And/or W/o  Result Date: 12/08/2017 CLINICAL DATA:  Mapping Y 90 radioembolization earlier today, now with right lower abdominal pain. Evaluate for hematoma/access site complication. EXAM: CTA ABDOMEN AND PELVIS WITH CONTRAST TECHNIQUE: Multidetector CT imaging of the abdomen and pelvis was performed using the standard protocol during bolus administration of intravenous contrast. Multiplanar reconstructed images and MIPs were obtained and  reviewed to evaluate the vascular anatomy. CONTRAST:  [id number] IOPAMIDOL (ISOVUE-370) INJECTION 76% COMPARISON:  Abdominal MRI-11/01/2017; CT abdomen pelvis - 07/07/2017 FINDINGS: VASCULAR Aorta: Scattered mixed calcified and noncalcified atherosclerotic plaque with a normal caliber abdominal aorta, not resulting in hemodynamically significant stenosis. Celiac: There is a minimal amount of atherosclerotic plaque involving the origin the celiac artery, not resulting in hemodynamically significant stenosis. Sequela of coil embolization the pancreaticoduodenal artery. SMA: There is a moderate amount of mixed calcified and noncalcified atherosclerotic plaque involving the origin of the SMA which approaches of approximately 50% luminal narrowing. Renals: Solitary bilaterally. There is a minimal amount of calcified atherosclerotic plaque involving the origin of left renal artery, not resulting in a hemodynamically significant stenosis. No vessel irregularity to suggest FMD. IMA: Remains widely patent. Pelvic inflow: There is a minimal amount of calcified atherosclerotic plaque involving the bilateral common iliac arteries, not resulting in a hemodynamically significant stenosis. The bilateral common iliac arteries are diseased though of normal caliber. The bilateral external iliac arteries are of normal caliber and widely patent. Right-sided proximal outflow: There is a minimal amount of mixed calcified and noncalcified atherosclerotic plaque within the right common femoral artery. There is a tiny area of active extravasation at the location of the right common femoral artery access site (  representative images 23 through 25) with associated hematoma tracking into the right lower pelvis and abdomen. No definitive retroperitoneal hematoma. The adjacent inferior epigastric artery appears widely patent as does the right deep iliac circumflex artery. No vessel dissection. The imaged portions of the right superficial and  deep femoral artery appear widely patent. Left-sided proximal outflow: The left common femoral artery is widely patent without hemodynamically significant stenosis. There is a minimal amount of calcified atherosclerotic plaque involving the origin of the left superficial femoral artery, not resulting in hemodynamically significant stenosis. The left deep femoral artery is widely patent throughout its imaged course. Veins: The pelvic venous system and IVC appear widely patent. Review of the MIP images confirms the above findings. NON-VASCULAR Lower chest: Limited visualization of the lower thorax is negative for focal airspace opacity or pleural effusion. Coronary artery calcifications.  No pericardial effusion. 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 Re demonstrated ill-defined area of hyperenhancement adjacent to the ablation zone within the anterior segment of the right lobe of the liver (image 23, series 2 as well as nodular areas of enhancement within the right lobe of the liver (images 21, 24 and 34) compatible with known multifocal hepatocellular carcinoma. The portal vein appears widely patent. Normal appearance of the gallbladder.  No radiopaque gallstones. Pancreas: Normal appearance of the pancreas Spleen: Normal appearance of the spleen Adrenals/Urinary Tract: Excreted contrast from a prior arteriogram seen within bilateral renal collecting system. No definite renal stones. No discrete renal lesions. No urinary obstruction or perinephric stranding. Excreted contrast seen within the urinary bladder. Stomach/Bowel: There is minimal mass effect upon the cecum a distal sigmoid colon secondary to the right lower abdominal hematoma. The bowel is otherwise normal in course and caliber without wall thickening or evidence of enteric obstruction. No pneumoperitoneum, pneumatosis or portal venous gas. Lymphatic: No bulky porta hepatis, retroperitoneal, mesenteric, pelvic or  inguinal lymphadenopathy. Reproductive: Dystrophic calcifications within normal sized prostate gland. No free fluid in the pelvic cul-de-sac. Other: Minimal amount of expected stranding within the right groin regional to the access site. Musculoskeletal: No acute or aggressive osseous abnormalities. Stigmata of DISH within the lower thoracic spine. Mild-to-moderate multilevel lumbar spine DDD, worse at a definitive 3 and L3-L4 with disc space height loss, endplate irregularity and sclerosis. Bilateral facet degenerative change within the lower lumbar spine. Bone island is noted involving the posterior aspect the right ilium, unchanged. IMPRESSION: VASCULAR 1. Tiny area of active extravasation from the right common femoral artery access site with associated hematoma extending from the right pelvis to the right lower abdomen. Patient was subsequently brought to the interventional radiology suite and approximately 30 additional minutes of manual compression was applied. 2.  Aortic Atherosclerosis (ICD10-I70.0). NON-VASCULAR 1. Otherwise, no acute findings within the abdomen or pelvis. 2. Similar findings of hepatic cirrhosis and multifocal hepatocellular carcinoma as seen on recent abdominal MRI. Electronically Signed   By: Sandi Mariscal M.D.   On: 12/08/2017 16:53   Nm Fusion  Result Date: 12/08/2017 CLINICAL DATA:  Pre yttrium 90 radioembolization evaluation. EXAM: NUCLEAR MEDICINE LIVER SCAN; ULTRASOUND MISCELLANEOUS SOFT TISSUE TECHNIQUE: Abdominal images were obtained in multiple projections after intrahepatic arterial injection of radiopharmaceutical. SPECT imaging was performed. Lung shunt calculation was performed. RADIOPHARMACEUTICALS:  4.87mllicurie MAA TECHNETIUM TO 70M ALBUMIN AGGREGATED COMPARISON:  MRI 06/28/2017, angiography 12/08/2017 FINDINGS: The injected microaggregated albumin localizes within the RIGHT hepatic lobe. Small focus of activity in the porta hepatis. No evidence of activity within the  stomach, duodenum,  or bowel. Calculated shunt fraction to the lungs equals 8.0%. IMPRESSION: 1. Injected MAA tracer activity localizes to the RIGHT hepatic lobe. 2. Small focus of radiotracer activity within the porta hepatis. Recommend catheter angiography evaluation at time of treatment. 3. Lung shunt fraction equals 8.0%. Findings conveyed toJOHN WATTS on 12/08/2017  at16:08. Electronically Signed   By: Suzy Bouchard M.D.   On: 12/08/2017 16:09    Labs:  CBC: Recent Labs    12/10/17 0910 12/15/17 1631 12/16/17 1052 12/22/17 0746  WBC 6.7 9.1 6.3 10.4  HGB 9.0* 9.1* 8.8* 11.8*  HCT 26.1* 26.7* 26.1* 35.8*  PLT 47* 86* 87* 136*    COAGS: Recent Labs    07/25/17 1146 12/08/17 0803 12/22/17 0746  INR 1.14 1.12 1.16    BMP: Recent Labs    12/09/17 0520 12/10/17 0249 12/15/17 1631 12/22/17 0746  NA 139 140 136 141  K 4.3 4.1 3.9 4.0  CL 104 109 103 104  CO2 21* '23 26 25  ' GLUCOSE 95 110* 95 76  BUN '13 9 10 14  ' CALCIUM 9.0 8.5* 8.7* 9.1  CREATININE 0.88 0.94 0.87 0.93  GFRNONAA >60 >60 >60 >60  GFRAA >60 >60 >60 >60    LIVER FUNCTION TESTS: Recent Labs    12/09/17 0520 12/10/17 0249 12/15/17 1631 12/22/17 0746  BILITOT 1.8* 1.5* 2.3* 2.4*  AST 43* 40 45* 71*  ALT '31 25 31 ' 59  ALKPHOS 91 83 133* 142*  PROT 6.6 6.1* 6.6 7.7  ALBUMIN 4.0 3.6 3.8 4.5    TUMOR MARKERS: No results for input(s): AFPTM, CEA, CA199, CHROMGRNA in the last 8760 hours.  Assessment and Plan:  Hepatocellular carcinoma. Right common femoral artery pseudoaneurysm. Plan for image-guided angiogram with Y90 radioembolization AND image-guided direct thrombin injection of right common femoral artery pseudoaneurysm. Patient is NPO. Denies fever and WBCs WNL. INR 1.16 seconds today.  Risks and benefits discussed with the patient including, but not limited to failure to treat entire lesion, bleeding, infection, damage to adjacent structures, and non-target radioembolization. All of the  patient's questions were answered, patient is agreeable to proceed. Consent signed and in chart.  Thank you for this interesting consult.  I greatly enjoyed meeting Mike Ferguson and look forward to participating in their care.  A copy of this report was sent to the requesting provider on this date.  Electronically Signed: Earley Abide, PA-C 12/22/2017, 8:35 AM   I spent a total of 25 Minutes in face to face in clinical consultation, greater than 50% of which was counseling/coordinating care for hepatocellular carcinoma AND right common femoral artery pseudoaneurysm.

## 2017-12-22 NOTE — Sedation Documentation (Addendum)
Procedure ended

## 2017-12-22 NOTE — Discharge Instructions (Signed)
Angiogram, Care After This sheet gives you information about how to care for yourself after your procedure. Your health care provider may also give you more specific instructions. If you have problems or questions, contact your health care provider. What can I expect after the procedure? After the procedure, it is common to have bruising and tenderness at the insertion area. Follow these instructions at home: Insertion site care  Follow instructions from your health care provider about how to take care of your insertion site. Make sure you: ? Wash your hands with soap and water before you change your bandage (dressing). If soap and water are not available, use hand sanitizer. ? Change your dressing as told by your health care provider. ?   Do not take baths, swim, or use a hot tub until your health care provider approves.  You may shower 24-48 hours after the procedure or as told by your health care provider. ? Gently wash the site with plain soap and water. ? Pat the area dry with a clean towel. ? Do not rub the site. This may cause bleeding.  Do not apply powder or lotion to the site. Keep the site clean and dry.  Check your insertion site every day for signs of infection. Check for: ? Redness, swelling, or pain. ? Fluid or blood. ? Warmth. ? Pus or a bad smell. Activity  Rest as told by your health care provider, usually for 1-2 days.  Do not lift anything that is heavier than 10 lbs. (4.5 kg) or as told by your health care provider.  Do not drive for 24 hours if you were given a medicine to help you relax (sedative).  Do not drive or use heavy machinery while taking prescription pain medicine. General instructions  Return to your normal activities as told by your health care provider, usually in about a week. Ask your health care provider what activities are safe for you.  If the catheter site starts bleeding, lie flat and put pressure on the site. If the bleeding does not  stop, get help right away. This is a medical emergency.  Drink enough fluid to keep your urine clear or pale yellow. This helps flush the contrast dye from your body.  Take over-the-counter and prescription medicines only as told by your health care provider.  Keep all follow-up visits as told by your health care provider. This is important. Contact a health care provider if:  You have a fever or chills.  You have redness, swelling, or pain around your insertion site.  You have fluid or blood coming from your insertion site.  The insertion site feels warm to the touch.  You have pus or a bad smell coming from your insertion site.  You have bruising around the insertion site.  You notice blood collecting in the tissue around the catheter site (hematoma). The hematoma may be painful to the touch. Get help right away if:  You have severe pain at the insertion area.  The catheter insertion area swells very fast.  The catheter insertion area is bleeding, and the bleeding does not stop when you hold steady pressure on the area.  The area near or just beyond the catheter insertion site becomes pale, cool, tingly, or numb. These symptoms may represent a serious problem that is an emergency. Do not wait to see if the symptoms will go away. Get medical help right away. Call your local emergency services (911 in the [place] Do not drive yourself to  the hospital. Summary  After the procedure, it is common to have bruising and tenderness at the catheter insertion area.  After the procedure, it is important to rest and drink plenty of fluids.  Do not take baths, swim, or use a hot tub until your health care provider says it is okay to do so. You may shower 24-48 hours after the procedure or as told by your health care provider.  If the catheter site starts bleeding, lie flat and put pressure on the site. If the bleeding does not stop, get help right away. This is a medical emergency. This  information is not intended to replace advice given to you by your health care provider. Make sure you discuss any questions you have with your health care provider. Document Released: 01/21/2005 Document Revised: 06/09/2016 Document Reviewed: 06/09/2016 Elsevier Interactive Patient Education  2018 [employer].     Moderate Conscious Sedation, Adult, Care After These instructions provide you with information about caring for yourself after your procedure. Your health care provider may also give you more specific instructions. Your treatment has been planned according to current medical practices, but problems sometimes occur. Call your health care provider if you have any problems or questions after your procedure. What can I expect after the procedure? After your procedure, it is common:  To feel sleepy for several hours.  To feel clumsy and have poor balance for several hours.  To have poor judgment for several hours.  To vomit if you eat too soon.  Follow these instructions at home: For at least 24 hours after the procedure:   Do not: ? Participate in activities where you could fall or become injured. ? Drive. ? Use heavy machinery. ? Drink alcohol. ? Take sleeping pills or medicines that cause drowsiness. ? Make important decisions or sign legal documents. ? Take care of children on your own.  Rest. Eating and drinking  Follow the diet recommended by your health care provider.  If you vomit: ? Drink water, juice, or soup when you can drink without vomiting. ? Make sure you have little or no nausea before eating solid foods. General instructions  Have a responsible adult stay with you until you are awake and alert.  Take over-the-counter and prescription medicines only as told by your health care provider.  If you smoke, do not smoke without supervision.  Keep all follow-up visits as told by your health care provider. This is important. Contact a health care  provider if:  You keep feeling nauseous or you keep vomiting.  You feel light-headed.  You develop a rash.  You have a fever. Get help right away if:  You have trouble breathing. This information is not intended to replace advice given to you by your health care provider. Make sure you discuss any questions you have with your health care provider. Document Released: 04/25/2013 Document Revised: 12/08/2015 Document Reviewed: 10/25/2015 Elsevier Interactive Patient Education  [employer].

## 2017-12-22 NOTE — Sedation Documentation (Signed)
Patient is resting comfortably with eyes closed in NAD. 

## 2017-12-22 NOTE — Procedures (Signed)
Pre Procedure Dx: Pseudoanneurysm Post Procedural Dx: Same  Technically successful US guided direct stick and thrombin injection of R CFA pseudoanneurysm.   EBL: None  No immediate complications.   Ronny Bacon, MD Pager #: [phone]

## 2017-12-26 ENCOUNTER — Other Ambulatory Visit: Payer: Self-pay | Admitting: *Deleted

## 2017-12-26 ENCOUNTER — Other Ambulatory Visit (HOSPITAL_COMMUNITY): Payer: Self-pay | Admitting: Interventional Radiology

## 2017-12-26 DIAGNOSIS — C220 Liver cell carcinoma: Secondary | ICD-10-CM

## 2017-12-26 DIAGNOSIS — T81718A Complication of other artery following a procedure, not elsewhere classified, initial encounter: Principal | ICD-10-CM

## 2017-12-26 DIAGNOSIS — I724 Aneurysm of artery of lower extremity: Secondary | ICD-10-CM

## 2017-12-26 DIAGNOSIS — T148XXA Other injury of unspecified body region, initial encounter: Secondary | ICD-10-CM

## 2018-01-16 ENCOUNTER — Ambulatory Visit
Admission: RE | Admit: 2018-01-16 | Discharge: 2018-01-16 | Disposition: A | Discharge status: Home / Self Care | Payer: Non-veteran care | Source: Ambulatory Visit | Attending: Interventional Radiology | Admitting: Interventional Radiology

## 2018-01-16 DIAGNOSIS — T81718A Complication of other artery following a procedure, not elsewhere classified, initial encounter: Principal | ICD-10-CM

## 2018-01-16 DIAGNOSIS — T148XXA Other injury of unspecified body region, initial encounter: Secondary | ICD-10-CM

## 2018-01-16 DIAGNOSIS — I724 Aneurysm of artery of lower extremity: Secondary | ICD-10-CM

## 2018-01-16 DIAGNOSIS — C220 Liver cell carcinoma: Secondary | ICD-10-CM

## 2018-01-18 ENCOUNTER — Ambulatory Visit
Admission: RE | Admit: 2018-01-18 | Discharge: 2018-01-18 | Disposition: A | Discharge status: Home / Self Care | Payer: No Typology Code available for payment source | Source: Ambulatory Visit | Attending: Radiology | Admitting: Radiology

## 2018-01-18 ENCOUNTER — Encounter: Payer: Self-pay | Admitting: Radiology

## 2018-01-18 DIAGNOSIS — C220 Liver cell carcinoma: Secondary | ICD-10-CM

## 2018-01-18 DIAGNOSIS — I724 Aneurysm of artery of lower extremity: Secondary | ICD-10-CM

## 2018-01-18 DIAGNOSIS — T148XXA Other injury of unspecified body region, initial encounter: Secondary | ICD-10-CM

## 2018-01-18 DIAGNOSIS — T81718A Complication of other artery following a procedure, not elsewhere classified, initial encounter: Principal | ICD-10-CM

## 2018-01-18 HISTORY — PX: IR RADIOLOGIST EVAL & MGMT: IMG5224

## 2018-01-18 NOTE — Progress Notes (Signed)
Patient ID: Mike Ferguson, male   DOB: 05/23/1941, 77 y.o.   MRN: [id number]         Chief Complaint: Multifocal [place]  Referring Physician(s): [name] (GI)  History of Present Illness: Mike Ferguson is a 77 y.o. male with past medical history significant for hypertension, gout, esophageal varices and alcoholic cirrhosis who underwent image guided hepatic microwave ablation on 06/25/2016, bland embolization performed on 07/25/2017 and mapping procedure of for Y 90 radioembolization performed on 12/08/2017 prior to planned staged Y 90 radioembolization of both the dominant right and accessory right hepatic arteries.  Unfortunately, the mapping Y 90 radial embolization was complicated by development of postprocedural access site hematoma and ultimately a pseudoaneurysm.  The patient was seen in repeat consultation on 12/21/2017 and the decision was made to proceed with attempted Y 90 radioembolization as well as potential percutaneous management of the right common femoral artery pseudoaneurysm.  Unfortunately, pre procedural labs on the morning of the planned Y 90 embolization demonstrated an elevated bilirubin level of 2.4 and as such, the Y 90 was not performed.  The patient did undergo successful percutaneous ultrasound-guided thrombin administration of the right common femoral artery access site pseudoaneurysm.  The patient returns today for post procedural evaluation and management.  The patient is again accompanied by his wife though serves as his own historian.  The patient states that he is slightly improved as far as energy level though admits his appetite is poor.  The patient admits to transient right groin pain as well as right upper abdominal quadrant pain.  He denies yellowing of the skin or eyes.  No increased abdominal girth.       Past Medical History:  Diagnosis Date  . Arthritis    generalized-back  . Cirrhosis ([place])   . Cirrhosis, alcoholic ([place])   . Esophageal varices ([place])    . Hearing impaired person, bilateral    hearing aids bilaterally  . Hepatocellular carcinoma ([place])   . Hypertension     Past Surgical History:  Procedure Laterality Date  . HERNIA REPAIR     RIH  . IR ANGIOGRAM SELECTIVE EACH ADDITIONAL VESSEL  07/25/2017  . IR ANGIOGRAM SELECTIVE EACH ADDITIONAL VESSEL  07/25/2017  . IR ANGIOGRAM SELECTIVE EACH ADDITIONAL VESSEL  07/25/2017  . IR ANGIOGRAM SELECTIVE EACH ADDITIONAL VESSEL  07/25/2017  . IR ANGIOGRAM SELECTIVE EACH ADDITIONAL VESSEL  12/08/2017  . IR ANGIOGRAM SELECTIVE EACH ADDITIONAL VESSEL  12/08/2017  . IR ANGIOGRAM SELECTIVE EACH ADDITIONAL VESSEL  12/08/2017  . IR ANGIOGRAM SELECTIVE EACH ADDITIONAL VESSEL  12/08/2017  . IR ANGIOGRAM SELECTIVE EACH ADDITIONAL VESSEL  12/08/2017  . IR ANGIOGRAM VISCERAL SELECTIVE  07/25/2017  . IR ANGIOGRAM VISCERAL SELECTIVE  07/25/2017  . IR ANGIOGRAM VISCERAL SELECTIVE  12/08/2017  . IR ANGIOGRAM VISCERAL SELECTIVE  12/08/2017  . IR EMBO ART  VEN HEMORR LYMPH EXTRAV  INC GUIDE ROADMAPPING  12/22/2017  . IR EMBO ARTERIAL NOT HEMORR HEMANG INC GUIDE ROADMAPPING  12/08/2017  . IR EMBO TUMOR ORGAN ISCHEMIA INFARCT INC GUIDE ROADMAPPING  07/25/2017  . IR FLUORO RM 30-60 MIN  12/08/2017  . IR GENERIC HISTORICAL  06/17/2016   IR RADIOLOGIST EVAL & MGMT 06/17/2016 Sandi Mariscal, MD [facility] INTERV RAD  . IR GENERIC HISTORICAL  07/21/2016   IR RADIOLOGIST EVAL & MGMT 07/21/2016 Sandi Mariscal, MD [facility] INTERV RAD  . IR RADIOLOGIST EVAL & MGMT  10/26/2016  . IR RADIOLOGIST EVAL & MGMT  04/05/2017  . IR RADIOLOGIST EVAL &  MGMT  01/11/2017  . IR RADIOLOGIST EVAL & MGMT  06/28/2017  . IR RADIOLOGIST EVAL & MGMT  08/18/2017  . IR RADIOLOGIST EVAL & MGMT  11/17/2017  . IR RADIOLOGIST EVAL & MGMT  12/21/2017  . IR US GUIDE VASC ACCESS RIGHT  07/25/2017  . IR US GUIDE VASC ACCESS RIGHT  12/08/2017  . IR US GUIDE VASC ACCESS RIGHT  12/22/2017  . Left rotator cuff      Allergies: Erythromycin  Medications: Prior to Admission medications     Medication Sig Start Date End Date Taking? Authorizing Provider  cholecalciferol (VITAMIN D) 1000 units tablet Take 2,000 Units by mouth daily.   Yes [provider]  loratadine-pseudoephedrine (CLARITIN-D 24-HOUR) 10-240 MG 24 hr tablet Take 1 tablet by mouth daily as needed for allergies.    Yes [provider]  mirtazapine (REMERON) 15 MG tablet Take 15 mg by mouth at bedtime.   Yes [provider]  polyethylene glycol (MIRALAX / GLYCOLAX) packet Take 17 g by mouth 2 (two) times daily. 12/10/17  Yes Sheikh, Omair Latif, DO  propranolol (INDERAL) 20 MG tablet Take 20 mg by mouth 2 (two) times daily. 05/31/16  Yes [provider]  rifaximin (XIFAXAN) 550 MG TABS tablet Take 550 mg by mouth 2 (two) times daily.   Yes [provider]  senna-docusate (SENOKOT-S) 8.6-50 MG tablet Take 1 tablet by mouth at bedtime. 12/10/17  Yes Sheikh, Omair Latif, DO  sodium chloride (OCEAN) 0.65 % SOLN nasal spray Place 1 spray into both nostrils as needed for congestion.   Yes [provider]  tamsulosin (FLOMAX) 0.4 MG CAPS capsule TAKE 1 capsule Once in the evening Orally 30 day(s) 05/20/17  Yes [provider]  traMADol (ULTRAM) 50 MG tablet Take 50 mg by mouth every 6 (six) hours as needed for moderate pain or severe pain.   Yes [provider]  vitamin B-12 (CYANOCOBALAMIN) 100 MCG tablet Take 100 mcg by mouth daily.   Yes [provider]  vitamin C (ASCORBIC ACID) 500 MG tablet Take 500 mg by mouth daily.   Yes [provider]  acetaminophen (TYLENOL) 500 MG tablet Take 500 mg by mouth daily as needed for mild pain.    [provider]     No family history on file.  Social History   Socioeconomic History  . Marital status: Married    Spouse name: Not on file  . Number of children: Not on file  . Years of education: Not on file  . Highest education level: Not on file  Occupational History  . Not on file   Social Needs  . Financial resource strain: Not on file  . Food insecurity:    Worry: Not on file    Inability: Not on file  . Transportation needs:    Medical: Not on file    Non-medical: Not on file  Tobacco Use  . Smoking status: Former Smoker    Last attempt to quit: 06/24/1986    Years since quitting: 31.5  . Smokeless tobacco: Never Used  Substance and Sexual Activity  . Alcohol use: No    Comment: 2011 Quit- ETOH abuse  . Drug use: No  . Sexual activity: Not Currently  Lifestyle  . Physical activity:    Days per week: Not on file    Minutes per session: Not on file  . Stress: Not on file  Relationships  . Social connections:    Talks on phone: Not  on file    Gets together: Not on file    Attends religious service: Not on file    Active member of club or organization: Not on file    Attends meetings of clubs or organizations: Not on file    Relationship status: Not on file  Other Topics Concern  . Not on file  Social History Narrative  . Not on file    ECOG Status: 1 - Symptomatic but completely ambulatory  Review of Systems: A 12 point ROS discussed and pertinent positives are indicated in the HPI above.  All other systems are negative.  Review of Systems  Constitutional: Negative for activity change, appetite change and fatigue.  Respiratory: Negative.   Cardiovascular: Negative.   Gastrointestinal: Negative.   Genitourinary:       Patient admits to right groin pain which radiates to his testicle/scrotum.   Skin: Negative for color change.    Vital Signs: BP (!) 140/59   Pulse (!) 49   Temp (!) 97.4 F (36.3 C) (Oral)   Resp 14   Ht 5\' 5"  (1.651 m)   Wt 139 lb (63 kg)   SpO2 100%   BMI 23.13 kg/m   Physical Exam  Constitutional:  Again appears older than his stated age, thought his complexion is improved.   Cardiovascular: Intact distal pulses.  Easily palpable right common femoral arterial pulse without palpable hematoma.  Skin:  Resolved  body wall erythema.  Nursing note and vitals reviewed.   Imaging: [place] Lower Ext [employer]  Result Date: 01/16/2018 CLINICAL DATA:  Follow-up right inguinal pseudoaneurysm EXAM: RIGHT LOWER EXTREMITY ARTERIAL LIMITED DUPLEX SCAN TECHNIQUE: Gray-scale sonography as well as color Doppler and duplex ultrasound was performed to evaluate the previously visualized right common femoral artery pseudoaneurysm. COMPARISON:  None. FINDINGS: Right lower Extremity Imaging in the right inguinal region demonstrates a 3.4 x 4.1 x 1.8 cm hypoechoic fluid collection in the deep soft cutaneous tissues consistent with a small hematoma. There is no evidence of pseudoaneurysm. IMPRESSION: Resolution of the previously visualized pseudoaneurysm. A small right inguinal hematoma persists. Electronically Signed   By: Marybelle Killings M.D.   On: 01/16/2018 12:02   [place] Lower Ext [employer]  Result Date: 12/21/2017 CLINICAL DATA:  Concern for pseudoaneurysm following mapping Y-90 radioembolization performed 12/08/2017 EXAM: RIGHT LOWER EXTREMITY ARTERIAL DUPLEX SCAN TECHNIQUE: Gray-scale sonography as well as color Doppler and duplex ultrasound was performed to evaluate the lower extremity arteries including the common, superficial and profunda femoral arteries, popliteal artery and calf arteries. COMPARISON:  CT abdomen and pelvis - 12/15/2017; 12/08/2017 FINDINGS: There is an approximately 3.7 x 3.3 x 5.0 cm mixed echogenic hematoma within the right groin, minimally decreased in size compared to the 12/15/2017 abdominal CT, previously, 4.0 x 3.5 x 5.4 cm. 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 The neck of the aneurysm measures approximately 3 mm in diameter and extends for a length of approximately 0.5 cm (images 1 and 2, series 2). The overall size of the opacified portion of the aneurysm measures  approximately 1.5 x 1.2 cm, unchanged to slightly decreased in size compared to abdominal CT performed 12/15/2017 previously, 2.4 x 1.6 x 1.3 cm. IMPRESSION: Slight reduction in size of now approximately 5 cm mixed echogenic hematoma within the right groin with suspected slight decrease in size of persistent small pseudoaneurysm as detailed above.  Electronically Signed   By: Sandi Mariscal M.D.   On: 12/21/2017 11:41   Ir US Guide Vasc Access Right  Result Date: 12/22/2017 INDICATION: History of narrow neck right common femoral artery pseudoaneurysm following Y 90 mapping radioembolization performed 12/08/2017. Patient presents today for ultrasound-guided direct stick of pseudoaneurysm and thrombin administration. EXAM: 1. ULTRASOUND GUIDANCE FOR ARTERIAL ACCESS 2. ULTRASOUND GUIDED ADMINISTRATION OF THROMBIN INTO THE RIGHT COMMON FEMORAL ARTERY PSEUDOANEURYSM MEDICATIONS: 200 units thrombin, administered into the right common femoral artery access site pseudoaneurysm. (5000 units of thrombin mixed with 5 cc of saline, 0.2 cc administered with a 1 cc syringe) ANESTHESIA/SEDATION: Moderate (conscious) sedation was employed during this procedure. A total of Fentanyl 100 mcg was administered intravenously. Moderate Sedation Time: 28 minutes. The patient's level of consciousness and vital signs were monitored continuously by radiology nursing throughout the procedure under my direct supervision. CONTRAST:  None FLUOROSCOPY TIME:  None COMPLICATIONS: None immediate. PROCEDURE: Informed consent was obtained from the patient following explanation of the procedure, risks, benefits and alternatives. The patient understands, agrees and consents for the procedure. All questions were addressed. Exhaustive sonographic evaluation the formed by the dictating interventional radiologist re-demonstrating a patent pseudoaneurysm adjacent to the right common femoral arterial access site. Note was made of a narrow neck serpiginous  connection between the dominant component of the pseudoaneurysm and the right common femoral artery (image 5). Multiple ultrasound images were saved for procedural documentation purposes. Initial attempts were made to achieve hemostasis with ultrasound-guided manual compression however after approximately 10 minutes of manual compression, the pseudoaneurysm remained patent. As such, the decision was made to perform ultrasound-guided administration of thrombin. A time out was performed prior to the initiation of the procedure. Maximal barrier sterile technique utilized including caps, mask, sterile gowns, sterile gloves, large sterile drape, hand hygiene, and Betadine prep. After the overlying soft tissues were anesthetized with 1% lidocaine, a micro needle was utilized to access the dominant peripheral component of the pseudoaneurysm. Ultrasound images were saved for procedural documentation purposes (images 6 and 7). Next, approximately 200 units of thrombin was slowly administered under direct grayscale sonographic guidance ultimately resulting in complete occlusion of the patent component of the pseudoaneurysm. Sonographic evaluation demonstrates apparent complete occlusion of the pseudoaneurysm and it's neck with preserved patency of the adjacent right common femoral artery (representative images 8 through 13). A dressing was placed. The patient tolerated procedure well without immediate postprocedural complication. Delayed bedside vascular ultrasound was performed by the dictating interventional radiologist approximately 3 hours following the thrombin injection, and again demonstrated complete occlusion of the pseudoaneurysm. IMPRESSION: Technically successful uncomplicated ultrasound guided direct stick of right common femoral artery pseudoaneurysm and administration of thrombin resulting in complete occlusion of the pseudoaneurysm. Electronically Signed   By: Sandi Mariscal M.D.   On: 12/22/2017 14:49   [place] Guide Roadmapping  Result Date: 12/22/2017 INDICATION: History of narrow neck right common femoral artery pseudoaneurysm following Y 90 mapping radioembolization performed 12/08/2017. Patient presents today for ultrasound-guided direct stick of pseudoaneurysm and thrombin administration. EXAM: 1. ULTRASOUND GUIDANCE FOR ARTERIAL ACCESS 2. ULTRASOUND GUIDED ADMINISTRATION OF THROMBIN INTO THE RIGHT COMMON FEMORAL ARTERY PSEUDOANEURYSM MEDICATIONS: 200 units thrombin, administered into the right common femoral artery access site pseudoaneurysm. (5000 units of thrombin mixed with 5 cc of saline, 0.2 cc administered with a 1 cc syringe) ANESTHESIA/SEDATION: Moderate (conscious) sedation was employed during this procedure. A total of Fentanyl 100 mcg was administered intravenously.  Moderate Sedation Time: 28 minutes. The patient's level of consciousness and vital signs were monitored continuously by radiology nursing throughout the procedure under my direct supervision. CONTRAST:  None FLUOROSCOPY TIME:  None COMPLICATIONS: None immediate. PROCEDURE: Informed consent was obtained from the patient following explanation of the procedure, risks, benefits and alternatives. The patient understands, agrees and consents for the procedure. All questions were addressed. Exhaustive sonographic evaluation the formed by the dictating interventional radiologist re-demonstrating a patent pseudoaneurysm adjacent to the right common femoral arterial access site. Note was made of a narrow neck serpiginous connection between the dominant component of the pseudoaneurysm and the right common femoral artery (image 5). Multiple ultrasound images were saved for procedural documentation purposes. Initial attempts were made to achieve hemostasis with ultrasound-guided manual compression however after approximately 10 minutes of manual compression, the pseudoaneurysm remained patent. As such, the decision  was made to perform ultrasound-guided administration of thrombin. A time out was performed prior to the initiation of the procedure. Maximal barrier sterile technique utilized including caps, mask, sterile gowns, sterile gloves, large sterile drape, hand hygiene, and Betadine prep. After the overlying soft tissues were anesthetized with 1% lidocaine, a micro needle was utilized to access the dominant peripheral component of the pseudoaneurysm. Ultrasound images were saved for procedural documentation purposes (images 6 and 7). Next, approximately 200 units of thrombin was slowly administered under direct grayscale sonographic guidance ultimately resulting in complete occlusion of the patent component of the pseudoaneurysm. Sonographic evaluation demonstrates apparent complete occlusion of the pseudoaneurysm and it's neck with preserved patency of the adjacent right common femoral artery (representative images 8 through 13). A dressing was placed. The patient tolerated procedure well without immediate postprocedural complication. Delayed bedside vascular ultrasound was performed by the dictating interventional radiologist approximately 3 hours following the thrombin injection, and again demonstrated complete occlusion of the pseudoaneurysm. IMPRESSION: Technically successful uncomplicated ultrasound guided direct stick of right common femoral artery pseudoaneurysm and administration of thrombin resulting in complete occlusion of the pseudoaneurysm. Electronically Signed   By: Sandi Mariscal M.D.   On: 12/22/2017 14:49   Ir Radiologist Eval & Mgmt  Result Date: 12/21/2017 Please refer to notes tab for details about interventional procedure. (Op Note)   Labs:  CBC: Recent Labs    12/10/17 0910 12/15/17 1631 12/16/17 1052 12/22/17 0746  WBC 6.7 9.1 6.3 10.4  HGB 9.0* 9.1* 8.8* 11.8*  HCT 26.1* 26.7* 26.1* 35.8*  PLT 47* 86* 87* 136*    COAGS: Recent Labs    07/25/17 1146 12/08/17 0803  12/22/17 0746  INR 1.14 1.12 1.16    BMP: Recent Labs    12/09/17 0520 12/10/17 0249 12/15/17 1631 12/22/17 0746  NA 139 140 136 141  K 4.3 4.1 3.9 4.0  CL 104 109 103 104  CO2 21* 23 26 25   GLUCOSE 95 110* 95 76  BUN 13 9 10 14   CALCIUM 9.0 8.5* 8.7* 9.1  CREATININE 0.88 0.94 0.87 0.93  GFRNONAA >60 >60 >60 >60  GFRAA >60 >60 >60 >60    LIVER FUNCTION TESTS: Recent Labs    12/09/17 0520 12/10/17 0249 12/15/17 1631 12/22/17 0746  BILITOT 1.8* 1.5* 2.3* 2.4*  AST 43* 40 45* 71*  ALT 31 25 31  59  ALKPHOS 91 83 133* 142*  PROT 6.6 6.1* 6.6 7.7  ALBUMIN 4.0 3.6 3.8 4.5    TUMOR MARKERS: No results for input(s): AFPTM, CEA, CA199, CHROMGRNA in the last 8760 hours.  Assessment and Plan:  Thomas Mabry  Conard is a 77 y.o. male with past medical history significant for hypertension, gout, esophageal varices and alcoholic cirrhosis who underwent image guided hepatic microwave ablation on 06/25/2016, bland embolization performed on 07/25/2017 and mapping procedure of for Y 90 radioembolization performed on 12/08/2017 prior to planned staged Y 90 radioembolization of both the dominant right and accessory right hepatic arteries.  Unfortunately, the mapping Y 90 radial embolization was complicated by development of postprocedural access site hematoma and ultimately a pseudoaneurysm which was successfully treated with ultrasound-guided thrombin injection performed on 12/22/2017 though secondary to elevated bilirubin level, the Y 90 embolization was not performed.  Right groin arterial ultrasound performed 01/16/2018 demonstrates a persistent small right groin hematoma without persistent pseudoaneurysm.  This hematoma is not appreciable on physical examination and there has been resolution of bruising noted body wall erythema.  Patient had labs performed on 12/28/2017 at the [facility] which demonstrate persistent elevated AFP (134) however there is been a normalization of the patient's  bilirubin of 1.2.  Given the normalization of the patient's bilirubin, the patient is a candidate for potential Y 90 embolization however at the present time, the patient does NOT wish to undergo hepatic directed therapy given the access site complication he suffered following the Y-90 mapping procedure.  As such, the patient will follow up with the [facility] oncology department next week for discussion of systemic therapy.  Patient and the patient's wife know to call the interventional radiology clinic if they wish to pursue Y 51 in the future, otherwise they may follow-up on a PRN basis.  A copy of this report was sent to the requesting provider on this date.  Electronically Signed: Sandi Mariscal 01/18/2018, 10:53 AM   I spent a total of 25 Minutes in face to face in clinical consultation, greater than 50% of which was counseling/coordinating care for multifocal [facility]

## 2018-03-01 ENCOUNTER — Emergency Department (HOSPITAL_COMMUNITY)
Admission: EM | Admit: 2018-03-01 | Discharge: 2018-03-01 | Disposition: A | Discharge status: Home / Self Care | Payer: Medicare Other | Attending: Emergency Medicine | Admitting: Emergency Medicine

## 2018-03-01 ENCOUNTER — Encounter (HOSPITAL_COMMUNITY): Payer: Self-pay | Admitting: Emergency Medicine

## 2018-03-01 DIAGNOSIS — Z87891 Personal history of nicotine dependence: Secondary | ICD-10-CM | POA: Insufficient documentation

## 2018-03-01 DIAGNOSIS — Z79899 Other long term (current) drug therapy: Secondary | ICD-10-CM | POA: Insufficient documentation

## 2018-03-01 DIAGNOSIS — R197 Diarrhea, unspecified: Secondary | ICD-10-CM | POA: Diagnosis not present

## 2018-03-01 DIAGNOSIS — I10 Essential (primary) hypertension: Secondary | ICD-10-CM | POA: Diagnosis not present

## 2018-03-01 DIAGNOSIS — Z8505 Personal history of malignant neoplasm of liver: Secondary | ICD-10-CM | POA: Diagnosis not present

## 2018-03-01 LAB — COMPREHENSIVE METABOLIC PANEL
ALK PHOS: 105 U/L (ref 38–126)
ALT: 28 U/L (ref 0–44)
AST: 54 U/L — ABNORMAL HIGH (ref 15–41)
Albumin: 4.4 g/dL (ref 3.5–5.0)
Anion gap: 9 (ref 5–15)
BUN: 9 mg/dL (ref 8–23)
CALCIUM: 9.6 mg/dL (ref 8.9–10.3)
CO2: 28 mmol/L (ref 22–32)
Chloride: 102 mmol/L (ref 98–111)
Creatinine, Ser: 0.8 mg/dL (ref 0.61–1.24)
Glucose, Bld: 73 mg/dL (ref 70–99)
Potassium: 4.7 mmol/L (ref 3.5–5.1)
Sodium: 139 mmol/L (ref 135–145)
Total Bilirubin: 1.3 mg/dL — ABNORMAL HIGH (ref 0.3–1.2)
Total Protein: 7.3 g/dL (ref 6.5–8.1)

## 2018-03-01 LAB — LIPASE, BLOOD: LIPASE: 42 U/L (ref 11–51)

## 2018-03-01 LAB — CBC
HCT: 38.3 % — ABNORMAL LOW (ref 39.0–52.0)
Hemoglobin: 13.8 g/dL (ref 13.0–17.0)
MCH: 32.6 pg (ref 26.0–34.0)
MCHC: 36 g/dL (ref 30.0–36.0)
MCV: 90.5 fL (ref 78.0–100.0)
PLATELETS: 84 10*3/uL — AB (ref 150–400)
RBC: 4.23 MIL/uL (ref 4.22–5.81)
RDW: 13.5 % (ref 11.5–15.5)
WBC: 5 10*3/uL (ref 4.0–10.5)

## 2018-03-01 LAB — URINALYSIS, ROUTINE W REFLEX MICROSCOPIC
Bilirubin Urine: NEGATIVE
Glucose, UA: NEGATIVE mg/dL
Hgb urine dipstick: NEGATIVE
Ketones, ur: NEGATIVE mg/dL
LEUKOCYTES UA: NEGATIVE
NITRITE: NEGATIVE
PROTEIN: NEGATIVE mg/dL
Specific Gravity, Urine: 1.003 — ABNORMAL LOW (ref 1.005–1.030)
pH: 6 (ref 5.0–8.0)

## 2018-03-01 LAB — C DIFFICILE QUICK SCREEN W PCR REFLEX
C DIFFICILE (CDIFF) INTERP: NOT DETECTED
C DIFFICILE (CDIFF) TOXIN: NEGATIVE
C DIFFICLE (CDIFF) ANTIGEN: NEGATIVE

## 2018-03-01 LAB — CK: CK TOTAL: 57 U/L (ref 49–397)

## 2018-03-01 MED ORDER — SODIUM CHLORIDE 0.9 % IV BOLUS
500.0000 mL | Freq: Once | INTRAVENOUS | Status: AC
  Administered 2018-03-01: 500 mL via INTRAVENOUS

## 2018-03-01 MED ORDER — SODIUM CHLORIDE 0.9 % IV BOLUS
1000.0000 mL | Freq: Once | INTRAVENOUS | Status: AC
  Administered 2018-03-01: 1000 mL via INTRAVENOUS

## 2018-03-01 NOTE — Discharge Instructions (Signed)
Please read and follow all provided instructions.  Your diagnoses today include:  1. Diarrhea, unspecified type    Tests performed today include:  Blood counts and electrolytes  Blood tests to check liver and kidney function  Blood tests to check pancreas function  Urine test to look for infection  Vital signs. See below for your results today.   Medications prescribed:  None  Take any prescribed medications only as directed.  Home care instructions:   Follow any educational materials contained in this packet.  Follow-up instructions: Please follow-up with your primary care provider in the next 3 days for further evaluation of your symptoms.    Return instructions:  SEEK IMMEDIATE MEDICAL ATTENTION IF:  The pain does not go away or becomes severe   A temperature above 101F develops   Repeated vomiting occurs (multiple episodes)   The pain becomes localized to portions of the abdomen. The right side could possibly be appendicitis. In an adult, the left lower portion of the abdomen could be colitis or diverticulitis.   Blood is being passed in stools or vomit (bright red or black tarry stools)   You develop chest pain, difficulty breathing, dizziness or fainting, or become confused, poorly responsive, or inconsolable (young children)  If you have any other emergent concerns regarding your health  Additional Information: Abdominal (belly) pain can be caused by many things. Your caregiver performed an examination and possibly ordered blood/urine tests and imaging (CT scan, x-rays, ultrasound). Many cases can be observed and treated at home after initial evaluation in the emergency department. Even though you are being discharged home, abdominal pain can be unpredictable. Therefore, you need a repeated exam if your pain does not resolve, returns, or worsens. Most patients with abdominal pain don't have to be admitted to the hospital or have surgery, but serious problems  like appendicitis and gallbladder attacks can start out as nonspecific pain. Many abdominal conditions cannot be diagnosed in one visit, so follow-up evaluations are very important.  Your vital signs today were: BP (!) 176/76 (BP Location: Left Arm)    Pulse 64    Temp 97.9 F (36.6 C) (Oral)    Resp 18    Ht 5\' 5"  (1.651 m)    Wt 59 kg    SpO2 100%    BMI 21.63 kg/m  If your blood pressure (bp) was elevated above 135/85 this visit, please have this repeated by your doctor within one month. --------------

## 2018-03-01 NOTE — ED Provider Notes (Signed)
[facility] DEPT Provider Note   CSN: [id number] Arrival date & time: 03/01/18  1128     History   Chief Complaint Chief Complaint  Patient presents with  . Diarrhea    HPI Mike Ferguson is a 77 y.o. male.   Patient with history of hepatocellular carcinoma, alcoholic cirrhosis, esophageal varices -- presents to the emergency department with complaint of diarrhea ongoing since he started a new chemotherapy agent on August 5.  Patient reports 5-10 watery to soft stools per day.  He does not have persistent blood in the stool.  No urinary symptoms.  He continues to eat and drink well.  No chest pain.  He has migrating cramping abdominal pain.  He has had cramping pain in his legs, worse over the past 3 nights, that has caused him trouble sleeping.  Patient sees oncologist at the [facility] in [place].  They were asked to come to the emergency department today over concerns of dehydration.  No recent antibiotic use.  He has taken Imodium today without improvement.  [facility] not [facility] to think that his symptoms were caused by the new medication. The onset of this condition was acute. The course is constant. Aggravating factors: none. Alleviating factors: none.       Past Medical History:  Diagnosis Date  . Arthritis    generalized-back  . Cirrhosis ([place])   . Cirrhosis, alcoholic ([place])   . Esophageal varices ([place])   . Hearing impaired person, bilateral    hearing aids bilaterally  . Hepatocellular carcinoma ([place])   . Hypertension     Patient Active Problem List   Diagnosis Date Noted  . Pseudoaneurysm of femoral artery following procedure ([place]) 12/15/2017  . Hematoma 12/08/2017  . Alcoholic cirrhosis of liver without ascites ([place]) 12/08/2017  . Hepatocellular carcinoma ([place]) 06/25/2016    Past Surgical History:  Procedure Laterality Date  . HERNIA REPAIR     RIH  . IR ANGIOGRAM SELECTIVE EACH ADDITIONAL VESSEL  07/25/2017  . IR ANGIOGRAM SELECTIVE  EACH ADDITIONAL VESSEL  07/25/2017  . IR ANGIOGRAM SELECTIVE EACH ADDITIONAL VESSEL  07/25/2017  . IR ANGIOGRAM SELECTIVE EACH ADDITIONAL VESSEL  07/25/2017  . IR ANGIOGRAM SELECTIVE EACH ADDITIONAL VESSEL  12/08/2017  . IR ANGIOGRAM SELECTIVE EACH ADDITIONAL VESSEL  12/08/2017  . IR ANGIOGRAM SELECTIVE EACH ADDITIONAL VESSEL  12/08/2017  . IR ANGIOGRAM SELECTIVE EACH ADDITIONAL VESSEL  12/08/2017  . IR ANGIOGRAM SELECTIVE EACH ADDITIONAL VESSEL  12/08/2017  . IR ANGIOGRAM VISCERAL SELECTIVE  07/25/2017  . IR ANGIOGRAM VISCERAL SELECTIVE  07/25/2017  . IR ANGIOGRAM VISCERAL SELECTIVE  12/08/2017  . IR ANGIOGRAM VISCERAL SELECTIVE  12/08/2017  . IR EMBO ART  VEN HEMORR LYMPH EXTRAV  INC GUIDE ROADMAPPING  12/22/2017  . IR EMBO ARTERIAL NOT HEMORR HEMANG INC GUIDE ROADMAPPING  12/08/2017  . IR EMBO TUMOR ORGAN ISCHEMIA INFARCT INC GUIDE ROADMAPPING  07/25/2017  . IR FLUORO RM 30-60 MIN  12/08/2017  . IR GENERIC HISTORICAL  06/17/2016   IR RADIOLOGIST EVAL & MGMT 06/17/2016 Sandi Mariscal, MD [facility] INTERV RAD  . IR GENERIC HISTORICAL  07/21/2016   IR RADIOLOGIST EVAL & MGMT 07/21/2016 Sandi Mariscal, MD [facility] INTERV RAD  . IR RADIOLOGIST EVAL & MGMT  10/26/2016  . IR RADIOLOGIST EVAL & MGMT  04/05/2017  . IR RADIOLOGIST EVAL & MGMT  01/11/2017  . IR RADIOLOGIST EVAL & MGMT  06/28/2017  . IR RADIOLOGIST EVAL & MGMT  08/18/2017  . IR RADIOLOGIST EVAL &  MGMT  11/17/2017  . IR RADIOLOGIST EVAL & MGMT  12/21/2017  . IR RADIOLOGIST EVAL & MGMT  01/18/2018  . IR US GUIDE VASC ACCESS RIGHT  07/25/2017  . IR US GUIDE VASC ACCESS RIGHT  12/08/2017  . IR US GUIDE VASC ACCESS RIGHT  12/22/2017  . Left rotator cuff          Home Medications    Prior to Admission medications   Medication Sig Start Date End Date Taking? Authorizing Provider  acetaminophen (TYLENOL) 500 MG tablet Take 500 mg by mouth daily as needed for mild pain.   Yes [provider]  ibuprofen (ADVIL,MOTRIN) 200 MG tablet Take 400 mg by mouth daily as needed for  moderate pain.   Yes [provider]  mirtazapine (REMERON) 15 MG tablet Take 15 mg by mouth at bedtime.   Yes [provider]  propranolol (INDERAL) 20 MG tablet Take 20 mg by mouth 2 (two) times daily. 05/31/16  Yes [provider]  rifaximin (XIFAXAN) 550 MG TABS tablet Take 550 mg by mouth 2 (two) times daily.   Yes [provider]  traMADol (ULTRAM) 50 MG tablet Take 100 mg by mouth daily as needed for moderate pain or severe pain.    Yes [provider]  vitamin C (ASCORBIC ACID) 500 MG tablet Take 500 mg by mouth daily.   Yes [provider]  cholecalciferol (VITAMIN D) 1000 units tablet Take 2,000 Units by mouth daily.    [provider]  polyethylene glycol (MIRALAX / GLYCOLAX) packet Take 17 g by mouth 2 (two) times daily. 12/10/17   Sheikh, Omair Latif, DO  senna-docusate (SENOKOT-S) 8.6-50 MG tablet Take 1 tablet by mouth at bedtime. 12/10/17   Kerney Elbe, DO    Family History No family history on file.  Social History Social History   Tobacco Use  . Smoking status: Former Smoker    Last attempt to quit: 06/24/1986    Years since quitting: 31.7  . Smokeless tobacco: Never Used  Substance Use Topics  . Alcohol use: No    Comment: 2011 Quit- ETOH abuse  . Drug use: No     Allergies   Erythromycin   Review of Systems Review of Systems  Constitutional: Negative for fever.  HENT: Negative for rhinorrhea and sore throat.   Eyes: Negative for redness.  Respiratory: Negative for cough.   Cardiovascular: Negative for chest pain.  Gastrointestinal: Positive for diarrhea. Negative for abdominal pain, nausea and vomiting.  Genitourinary: Negative for dysuria.  Musculoskeletal: Positive for myalgias.  Skin: Negative for rash.  Neurological: Negative for syncope, light-headedness and headaches.     Physical Exam Updated Vital Signs BP (!) 185/94 (BP Location: Left Arm)   Pulse (!) 55   Temp 97.9 F  (36.6 C) (Oral)   Resp 17   Ht 5\' 5"  (1.651 m)   Wt 59 kg   SpO2 100%   BMI 21.63 kg/m    Physical Exam  Constitutional: He appears well-developed and well-nourished.  HENT:  Head: Normocephalic and atraumatic.  Mouth/Throat: Oropharynx is clear and moist.  Eyes: Conjunctivae are normal. Right eye exhibits no discharge. Left eye exhibits no discharge.  Neck: Normal range of motion. Neck supple.  Cardiovascular: Normal rate, regular rhythm and normal heart sounds.  Pulmonary/Chest: Effort normal and breath sounds normal.  Abdominal: Soft. There is no tenderness. There is no rebound and no guarding.  Musculoskeletal: He exhibits no edema or tenderness.  Neurological: He  is alert.  Skin: Skin is warm and dry.  Psychiatric: He has a normal mood and affect.  Nursing note and vitals reviewed.    ED Treatments / Results  Labs (all labs ordered are listed, but only abnormal results are displayed) Labs Reviewed  COMPREHENSIVE METABOLIC PANEL - Abnormal; Notable for the following components:      Result Value   AST 54 (*)    Total Bilirubin 1.3 (*)    All other components within normal limits  CBC - Abnormal; Notable for the following components:   HCT 38.3 (*)    Platelets 84 (*)    All other components within normal limits  URINALYSIS, ROUTINE W REFLEX MICROSCOPIC - Abnormal; Notable for the following components:   Color, Urine STRAW (*)    Specific Gravity, Urine 1.003 (*)    All other components within normal limits  GASTROINTESTINAL PANEL BY PCR, STOOL (REPLACES STOOL CULTURE)  C DIFFICILE QUICK SCREEN W PCR REFLEX  LIPASE, BLOOD  CK    EKG None  Radiology No results found.  Procedures Procedures (including critical care time)  Medications Ordered in ED Medications  sodium chloride 0.9 % bolus 1,000 mL ( Intravenous Stopped 03/01/18 1516)  sodium chloride 0.9 % bolus 500 mL ( Intravenous Stopped 03/01/18 1447)     Initial Impression / Assessment and Plan /  ED Course  I have reviewed the triage vital signs and the nursing notes.  Pertinent labs & imaging results that were available during my care of the patient were reviewed by me and considered in my medical decision making (see chart for details).     Patient seen and examined. Work-up initiated. Fluids ordered.   Patient had a brief episode where he felt very warm and "glazed over" but did not fully lose consciousness while lying in bed.  No associated chest pain or shortness of breath.  Vital signs reviewed and are as follows: BP (!) 185/94 (BP Location: Left Arm)   Pulse (!) 55   Temp 97.9 F (36.6 C) (Oral)   Resp 17   Ht 5\' 5"  (1.651 m)   Wt 59 kg   SpO2 100%   BMI 21.63 kg/m    Discussed with Dr. Regenia Skeeter who will see.   4:32 PM Patient's labs are reassuring.  He has received IV fluids.  He has ambulated down the hallway to the bathroom without any difficulty.  No more symptoms suspicious for syncope.  Feel patient can be discharged home at this time.  He stopped his chemotherapy as of yesterday and will follow up with his oncologist regarding whether or not he should continue this.  Encouraged continued fluid intake. GI pathogen panel is pending.   The patient was urged to return to the Emergency Department immediately with worsening of current symptoms, worsening abdominal pain, persistent vomiting, blood noted in stools, fever, or any other concerns. The patient verbalized understanding.    Final Clinical Impressions(s) / ED Diagnoses   Final diagnoses:  Diarrhea, unspecified type   Patient currently undergoing chemotherapy for hepatocellular carcinoma with diarrhea.  Work-up here is reassuring.  Normal electrolytes.  No profound dehydration.  Patient is ambulatory without difficulty.  No CP/SOB.  No focal tenderness on exam.  GI pathogen panel is pending.  No indications for admission at this time given his well appearance and improvement in symptoms.  He has appropriate  follow-up.  Return instructions as above.  Discussed with and seen by Dr. Regenia Skeeter.  ED Discharge Orders  None      Carlisle Cater, PA-C 03/01/18 1635  Sherwood Gambler, MD 03/02/18 2234

## 2018-03-01 NOTE — ED Triage Notes (Signed)
Pt reports diarrhea since Aug 5 when started new chemo drug. [facility]'t [facility] is causing the diarrhea. Pt having some leg muscle pains. Had 2 doses of Imodium this morning and not helped.

## 2018-03-02 LAB — GASTROINTESTINAL PANEL BY PCR, STOOL (REPLACES STOOL CULTURE)

## 2018-03-17 DIAGNOSIS — D649 Anemia, unspecified: Secondary | ICD-10-CM | POA: Diagnosis not present

## 2018-03-17 DIAGNOSIS — R899 Unspecified abnormal finding in specimens from other organs, systems and tissues: Secondary | ICD-10-CM | POA: Diagnosis not present

## 2018-03-17 DIAGNOSIS — E7800 Pure hypercholesterolemia, unspecified: Secondary | ICD-10-CM | POA: Diagnosis not present

## 2018-03-21 DIAGNOSIS — Z23 Encounter for immunization: Secondary | ICD-10-CM | POA: Diagnosis not present

## 2018-03-21 DIAGNOSIS — Z0000 Encounter for general adult medical examination without abnormal findings: Secondary | ICD-10-CM | POA: Diagnosis not present

## 2018-03-21 DIAGNOSIS — D696 Thrombocytopenia, unspecified: Secondary | ICD-10-CM | POA: Diagnosis not present

## 2018-03-21 DIAGNOSIS — K766 Portal hypertension: Secondary | ICD-10-CM | POA: Diagnosis not present

## 2018-06-05 ENCOUNTER — Encounter (HOSPITAL_COMMUNITY): Payer: Self-pay | Admitting: Emergency Medicine

## 2018-06-05 ENCOUNTER — Emergency Department (HOSPITAL_COMMUNITY)
Admission: EM | Admit: 2018-06-05 | Discharge: 2018-06-05 | Disposition: A | Discharge status: Home / Self Care | Payer: No Typology Code available for payment source | Attending: Emergency Medicine | Admitting: Emergency Medicine

## 2018-06-05 DIAGNOSIS — I10 Essential (primary) hypertension: Secondary | ICD-10-CM | POA: Insufficient documentation

## 2018-06-05 DIAGNOSIS — Z87891 Personal history of nicotine dependence: Secondary | ICD-10-CM | POA: Insufficient documentation

## 2018-06-05 DIAGNOSIS — R197 Diarrhea, unspecified: Secondary | ICD-10-CM | POA: Insufficient documentation

## 2018-06-05 DIAGNOSIS — Z79899 Other long term (current) drug therapy: Secondary | ICD-10-CM | POA: Diagnosis not present

## 2018-06-05 LAB — COMPREHENSIVE METABOLIC PANEL
ALK PHOS: 147 U/L — AB (ref 38–126)
ALT: 27 U/L (ref 0–44)
AST: 53 U/L — ABNORMAL HIGH (ref 15–41)
Albumin: 3.4 g/dL — ABNORMAL LOW (ref 3.5–5.0)
Anion gap: 11 (ref 5–15)
BILIRUBIN TOTAL: 2.6 mg/dL — AB (ref 0.3–1.2)
BUN: 8 mg/dL (ref 8–23)
CALCIUM: 7.3 mg/dL — AB (ref 8.9–10.3)
CO2: 23 mmol/L (ref 22–32)
Chloride: 99 mmol/L (ref 98–111)
Creatinine, Ser: 0.93 mg/dL (ref 0.61–1.24)
Glucose, Bld: 101 mg/dL — ABNORMAL HIGH (ref 70–99)
POTASSIUM: 3.1 mmol/L — AB (ref 3.5–5.1)
Sodium: 133 mmol/L — ABNORMAL LOW (ref 135–145)
TOTAL PROTEIN: 6.2 g/dL — AB (ref 6.5–8.1)

## 2018-06-05 LAB — TYPE AND SCREEN
ABO/RH(D): O NEG
ANTIBODY SCREEN: NEGATIVE

## 2018-06-05 LAB — CBC
HEMATOCRIT: 35.3 % — AB (ref 39.0–52.0)
HEMOGLOBIN: 12.2 g/dL — AB (ref 13.0–17.0)
MCH: 33.2 pg (ref 26.0–34.0)
MCHC: 34.6 g/dL (ref 30.0–36.0)
MCV: 95.9 fL (ref 80.0–100.0)
Platelets: 77 10*3/uL — ABNORMAL LOW (ref 150–400)
RBC: 3.68 MIL/uL — ABNORMAL LOW (ref 4.22–5.81)
RDW: 13.1 % (ref 11.5–15.5)
WBC: 14.1 10*3/uL — ABNORMAL HIGH (ref 4.0–10.5)
nRBC: 0 % (ref 0.0–0.2)

## 2018-06-05 LAB — C DIFFICILE QUICK SCREEN W PCR REFLEX
C DIFFICILE (CDIFF) INTERP: NOT DETECTED
C Diff antigen: NEGATIVE
C Diff toxin: NEGATIVE

## 2018-06-05 LAB — LIPASE, BLOOD: LIPASE: 39 U/L (ref 11–51)

## 2018-06-05 MED ORDER — SODIUM CHLORIDE 0.9 % IV BOLUS
1000.0000 mL | Freq: Once | INTRAVENOUS | Status: AC
  Administered 2018-06-05: 1000 mL via INTRAVENOUS

## 2018-06-05 MED ORDER — POTASSIUM CHLORIDE CRYS ER 20 MEQ PO TBCR: 20.0000 meq | EXTENDED_RELEASE_TABLET | Freq: Two times a day (BID) | ORAL | 0 refills | Status: AC

## 2018-06-05 NOTE — ED Triage Notes (Signed)
Pt had diarrhea that were dark over week. Had upper endoscopy last week at [facility] to find cause of bleeding. Blood has stopped but still having diarrhea. Takes Imodium but soon as where off, diarrhea back.  Not currently on antibiotics. Has GI ulcer.

## 2018-06-05 NOTE — ED Provider Notes (Signed)
[facility] DEPT Provider Note   CSN: [id number] Arrival date & time: 06/05/18  1050     History   Chief Complaint Chief Complaint  Patient presents with  . Diarrhea    HPI Mike Ferguson is a 77 y.o. male.  HPI Patient is a 77 year old male presents the emergency room department ongoing diarrhea over the past several days.  He was recently been seen and evaluated the [facility] with an EGD which demonstrated some small nonsignificant gastric ulcers.  He had gastropathy.  He was recommended to take Protonix.  At that time he was having darker stools.  He never required blood transfusion.  Since then his stools have lightened up but now he is having profuse watery diarrhea.  This been present over the past 2 to 3 days.  He contacted his GI team and recommend they come to the ER for evaluation as there is no gastroenterology appointments available today at the [facility].  Wife reports foul smell.  No history of C. difficile.  No recent antibiotics.  He is tried Imodium which seems to slow his bowel movements down some but gives some crampy abdominal pain.   Past Medical History:  Diagnosis Date  . Arthritis    generalized-back  . Cirrhosis ([place])   . Cirrhosis, alcoholic ([place])   . Esophageal varices ([place])   . Hearing impaired person, bilateral    hearing aids bilaterally  . Hepatocellular carcinoma ([place])   . Hypertension     Patient Active Problem List   Diagnosis Date Noted  . Pseudoaneurysm of femoral artery following procedure ([place]) 12/15/2017  . Hematoma 12/08/2017  . Alcoholic cirrhosis of liver without ascites ([place]) 12/08/2017  . Hepatocellular carcinoma ([place]) 06/25/2016    Past Surgical History:  Procedure Laterality Date  . HERNIA REPAIR     RIH  . IR ANGIOGRAM SELECTIVE EACH ADDITIONAL VESSEL  07/25/2017  . IR ANGIOGRAM SELECTIVE EACH ADDITIONAL VESSEL  07/25/2017  . IR ANGIOGRAM SELECTIVE EACH ADDITIONAL VESSEL  07/25/2017  . IR ANGIOGRAM  SELECTIVE EACH ADDITIONAL VESSEL  07/25/2017  . IR ANGIOGRAM SELECTIVE EACH ADDITIONAL VESSEL  12/08/2017  . IR ANGIOGRAM SELECTIVE EACH ADDITIONAL VESSEL  12/08/2017  . IR ANGIOGRAM SELECTIVE EACH ADDITIONAL VESSEL  12/08/2017  . IR ANGIOGRAM SELECTIVE EACH ADDITIONAL VESSEL  12/08/2017  . IR ANGIOGRAM SELECTIVE EACH ADDITIONAL VESSEL  12/08/2017  . IR ANGIOGRAM VISCERAL SELECTIVE  07/25/2017  . IR ANGIOGRAM VISCERAL SELECTIVE  07/25/2017  . IR ANGIOGRAM VISCERAL SELECTIVE  12/08/2017  . IR ANGIOGRAM VISCERAL SELECTIVE  12/08/2017  . IR EMBO ART  VEN HEMORR LYMPH EXTRAV  INC GUIDE ROADMAPPING  12/22/2017  . IR EMBO ARTERIAL NOT HEMORR HEMANG INC GUIDE ROADMAPPING  12/08/2017  . IR EMBO TUMOR ORGAN ISCHEMIA INFARCT INC GUIDE ROADMAPPING  07/25/2017  . IR FLUORO RM 30-60 MIN  12/08/2017  . IR GENERIC HISTORICAL  06/17/2016   IR RADIOLOGIST EVAL & MGMT 06/17/2016 Sandi Mariscal, MD [facility] INTERV RAD  . IR GENERIC HISTORICAL  07/21/2016   IR RADIOLOGIST EVAL & MGMT 07/21/2016 Sandi Mariscal, MD [facility] INTERV RAD  . IR RADIOLOGIST EVAL & MGMT  10/26/2016  . IR RADIOLOGIST EVAL & MGMT  04/05/2017  . IR RADIOLOGIST EVAL & MGMT  01/11/2017  . IR RADIOLOGIST EVAL & MGMT  06/28/2017  . IR RADIOLOGIST EVAL & MGMT  08/18/2017  . IR RADIOLOGIST EVAL & MGMT  11/17/2017  . IR RADIOLOGIST EVAL & MGMT  12/21/2017  . IR RADIOLOGIST  EVAL & MGMT  01/18/2018  . IR US GUIDE VASC ACCESS RIGHT  07/25/2017  . IR US GUIDE VASC ACCESS RIGHT  12/08/2017  . IR US GUIDE VASC ACCESS RIGHT  12/22/2017  . Left rotator cuff          Home Medications    Prior to Admission medications   Medication Sig Start Date End Date Taking? Authorizing Provider  acetaminophen (TYLENOL) 500 MG tablet Take 500 mg by mouth daily as needed for mild pain.   Yes [provider]  mirtazapine (REMERON) 15 MG tablet Take 15 mg by mouth at bedtime.   Yes [provider]  NIVOLUMAB IV Inject into the vein.   Yes [provider]  pantoprazole  (PROTONIX) 40 MG tablet Take 40 mg by mouth 2 (two) times daily.   Yes [provider]  propranolol (INDERAL) 20 MG tablet Take 20 mg by mouth 2 (two) times daily. 05/31/16  Yes [provider]  rifaximin (XIFAXAN) 550 MG TABS tablet Take 550 mg by mouth 2 (two) times daily.   Yes [provider]  tamsulosin (FLOMAX) 0.4 MG CAPS capsule Take 0.4 mg by mouth every evening. 04/03/18  Yes [provider]  traMADol (ULTRAM) 50 MG tablet Take 50-100 mg by mouth 3 (three) times daily as needed for moderate pain or severe pain.    Yes [provider]  vitamin C (ASCORBIC ACID) 500 MG tablet Take 500 mg by mouth daily.   Yes [provider]  potassium chloride SA (K-DUR,KLOR-CON) 20 MEQ tablet Take 1 tablet (20 mEq total) by mouth 2 (two) times daily. 06/05/18   Jola Schmidt, MD    Family History No family history on file.  Social History Social History   Tobacco Use  . Smoking status: Former Smoker    Last attempt to quit: 06/24/1986    Years since quitting: 31.9  . Smokeless tobacco: Never Used  Substance Use Topics  . Alcohol use: No    Comment: 2011 Quit- ETOH abuse  . Drug use: No     Allergies   Erythromycin   Review of Systems Review of Systems  All other systems reviewed and are negative.    Physical Exam Updated Vital Signs BP 128/68   Pulse 88   Temp 98.9 F (37.2 C)   Resp 16   Ht 5\' 5"  (1.651 m)   Wt 59 kg   SpO2 97%   BMI 21.63 kg/m   Physical Exam  Constitutional: He is oriented to person, place, and time. He appears well-developed and well-nourished.  HENT:  Head: Normocephalic and atraumatic.  Eyes: EOM are normal.  Neck: Normal range of motion.  Cardiovascular: Normal rate, regular rhythm, normal heart sounds and intact distal pulses.  Pulmonary/Chest: Effort normal and breath sounds normal. No respiratory distress.  Abdominal: Soft. He exhibits no distension. There is no tenderness.    Musculoskeletal: Normal range of motion.  Neurological: He is alert and oriented to person, place, and time.  Skin: Skin is warm and dry.  Psychiatric: He has a normal mood and affect. Judgment normal.  Nursing note and vitals reviewed.    ED Treatments / Results  Labs (all labs ordered are listed, but only abnormal results are displayed) Labs Reviewed  CBC - Abnormal; Notable for the following components:      Result Value   WBC 14.1 (*)    RBC 3.68 (*)    Hemoglobin 12.2 (*)    HCT 35.3 (*)  Platelets 77 (*)    All other components within normal limits  COMPREHENSIVE METABOLIC PANEL - Abnormal; Notable for the following components:   Sodium 133 (*)    Potassium 3.1 (*)    Glucose, Bld 101 (*)    Calcium 7.3 (*)    Total Protein 6.2 (*)    Albumin 3.4 (*)    AST 53 (*)    Alkaline Phosphatase 147 (*)    Total Bilirubin 2.6 (*)    All other components within normal limits  C DIFFICILE QUICK SCREEN W PCR REFLEX  STOOL CULTURE  LIPASE, BLOOD  TYPE AND SCREEN    EKG None  Radiology No results found.  Procedures Procedures (including critical care time)  Medications Ordered in ED Medications  sodium chloride 0.9 % bolus 1,000 mL (0 mLs Intravenous Stopped 06/05/18 1232)     Initial Impression / Assessment and Plan / ED Course  I have reviewed the triage vital signs and the nursing notes.  Pertinent labs & imaging results that were available during my care of the patient were reviewed by me and considered in my medical decision making (see chart for details).     Hydrated in the emergency department.  Mild hypokalemia.  This will be replaced orally.  C. difficile is negative.  Close primary care follow-up and GI follow-up.  Recommended ongoing oral hydration at home.  At this time I am recommending to withhold Imodium as I think this is worsening his crampy type abdominal pain.  No indication for advanced imaging.  Abdominal exam without focal tenderness.   Close PCP and GI follow-up.  Patient encouraged to return the emergency department for new or worsening symptoms  Final Clinical Impressions(s) / ED Diagnoses   Final diagnoses:  Diarrhea, unspecified type    ED Discharge Orders         Ordered    potassium chloride SA (K-DUR,KLOR-CON) 20 MEQ tablet  2 times daily     06/05/18 1451           Jola Schmidt, MD 06/05/18 1459

## 2018-06-05 NOTE — Discharge Instructions (Signed)
Please call your gastroenterologist for follow up °

## 2018-06-06 DIAGNOSIS — K529 Noninfective gastroenteritis and colitis, unspecified: Secondary | ICD-10-CM | POA: Diagnosis not present

## 2018-06-06 DIAGNOSIS — R05 Cough: Secondary | ICD-10-CM | POA: Diagnosis not present

## 2018-06-07 DIAGNOSIS — R140 Abdominal distension (gaseous): Secondary | ICD-10-CM | POA: Diagnosis not present

## 2018-06-09 DIAGNOSIS — C7951 Secondary malignant neoplasm of bone: Secondary | ICD-10-CM | POA: Diagnosis not present

## 2018-06-09 LAB — STOOL CULTURE: E COLI SHIGA TOXIN ASSAY: NEGATIVE

## 2018-06-09 LAB — STOOL CULTURE REFLEX - RSASHR

## 2018-06-09 LAB — STOOL CULTURE REFLEX - CMPCXR

## 2018-07-20 DIAGNOSIS — H6123 Impacted cerumen, bilateral: Secondary | ICD-10-CM | POA: Diagnosis not present

## 2018-08-07 IMAGING — US US EXTREM LOW DUPLEX ARTERIAL*R* LIMITED
2 series · 13 of 25 positions shown · non-contrast
Comparison: CT abdomen and pelvis - 12/15/2017; 12/08/2017

CLINICAL DATA: Concern for pseudoaneurysm following mapping Y-90
radioembolization performed 12/08/2017

EXAM:
RIGHT LOWER EXTREMITY ARTERIAL DUPLEX SCAN
TECHNIQUE: Gray-scale sonography as well as color Doppler and duplex ultrasound
was performed to evaluate the lower extremity arteries including the
common, superficial and profunda femoral arteries, popliteal artery
and calf arteries.

[Series 1: us extrem low duplex arterial*right* limited · 0.07mm/px · 12 of 42 slices shown (1 of 2)]
[im 1/42]
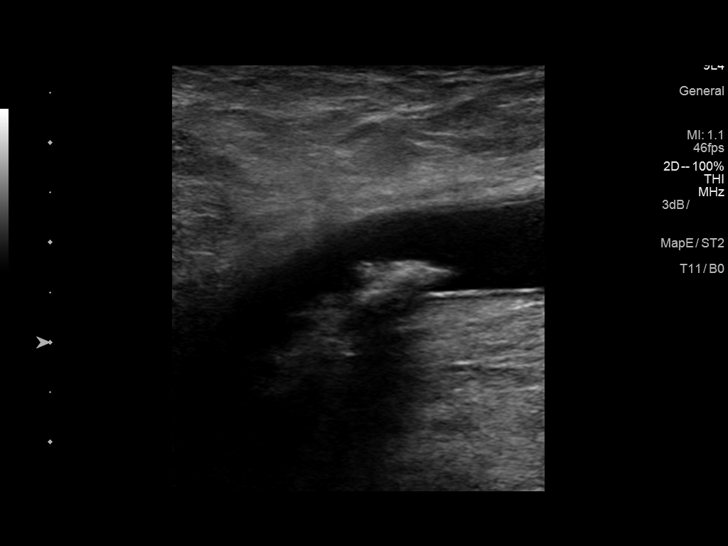
[im 4/42]
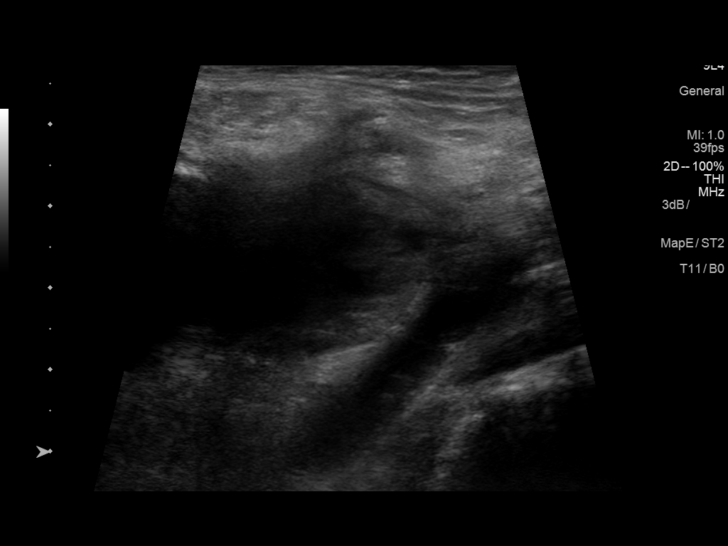
[im 8/42]
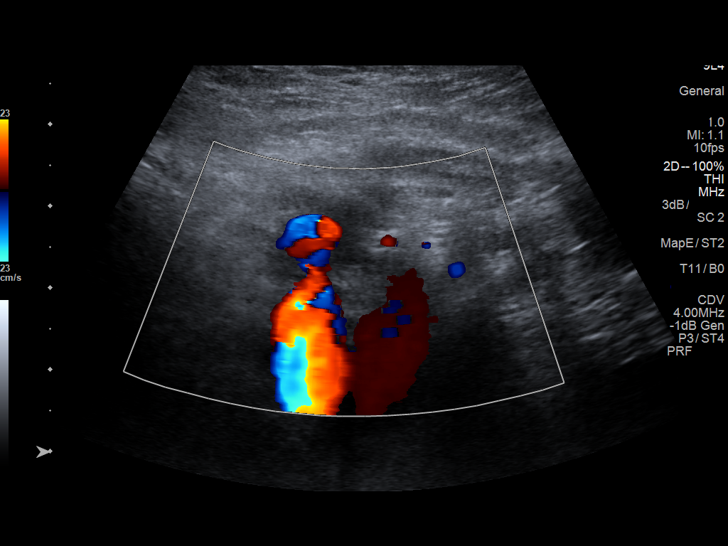
[im 11/42]
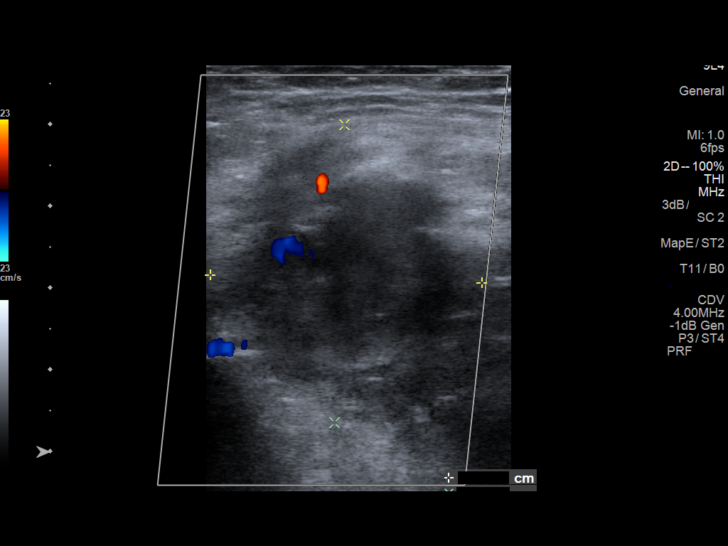
[im 15/42]
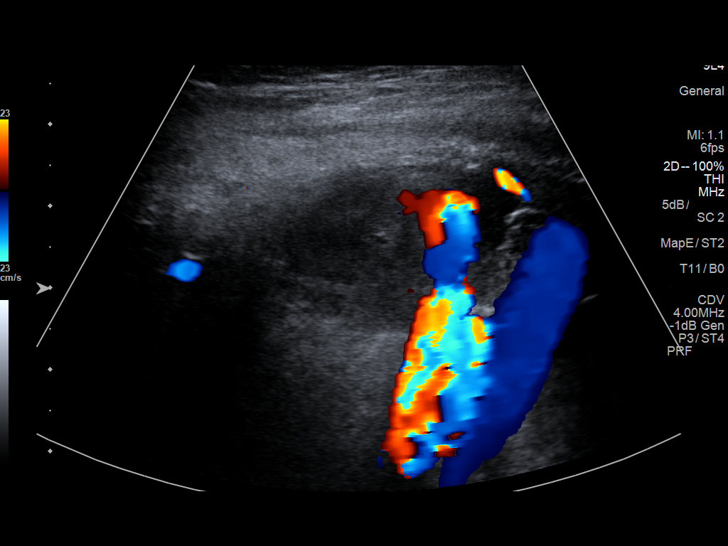
[im 18/42]
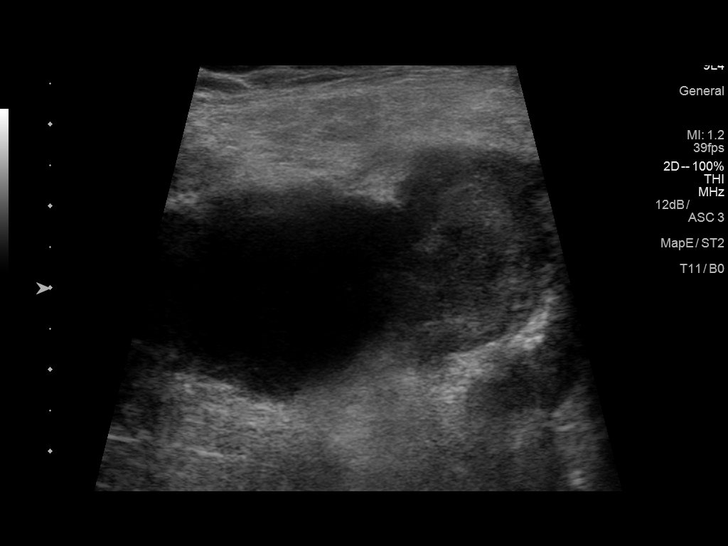
[im 22/42]
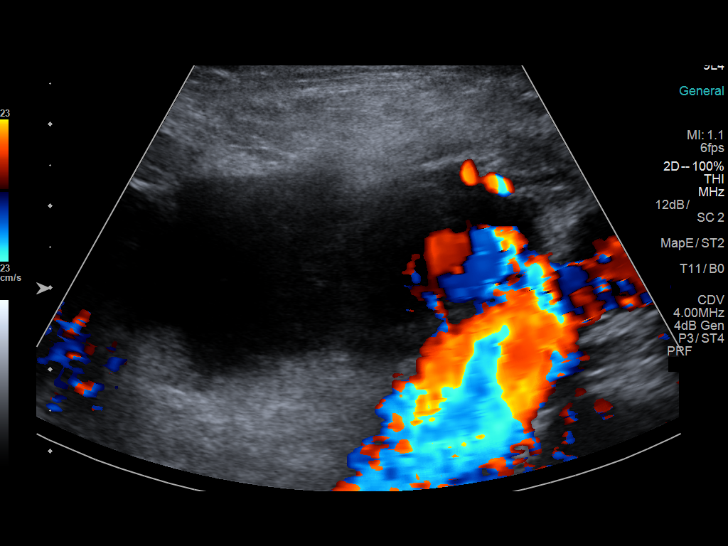
[im 25/42]
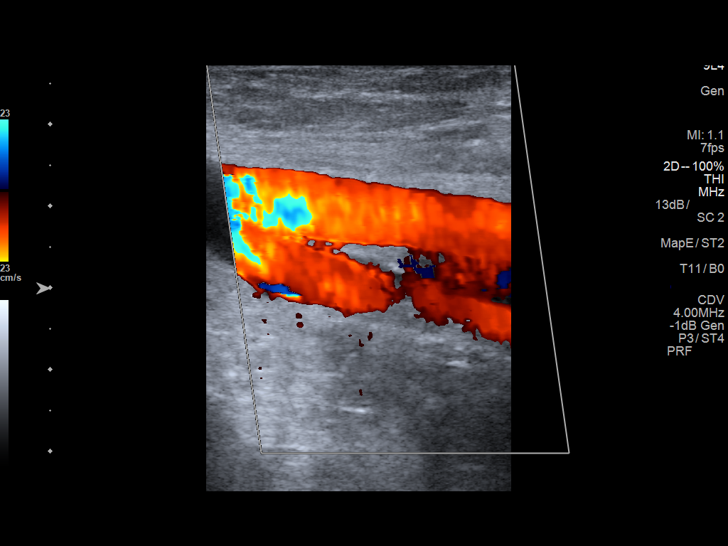
[im 29/42]
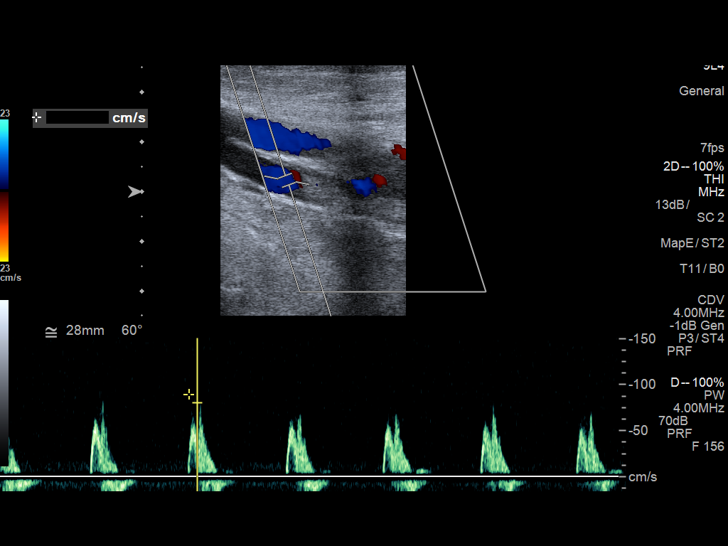
[im 33/42]
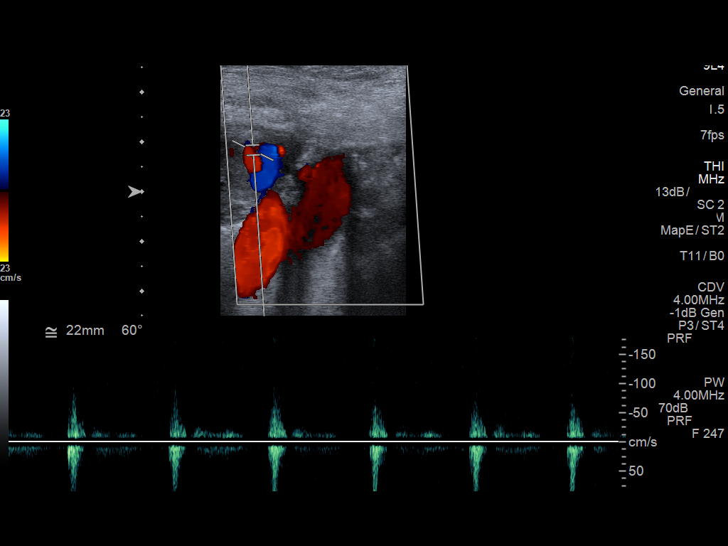
[im 36/42]
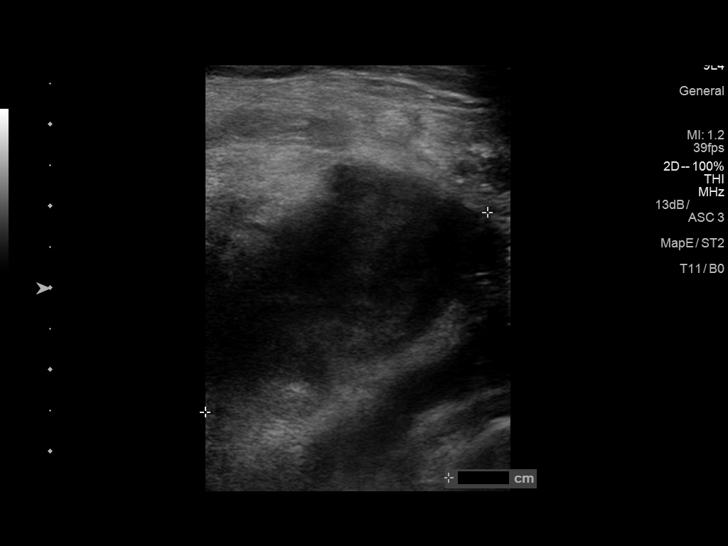
[im 40/42]
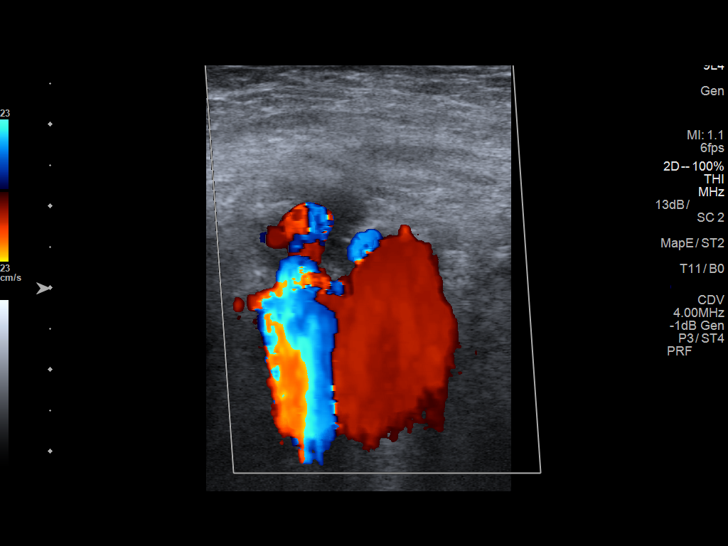

[Series 2001: us extrem low duplex arterial*right* limited · 0.07mm/px · 1 of 2 slices shown (2 of 2)]
[im 1/2]
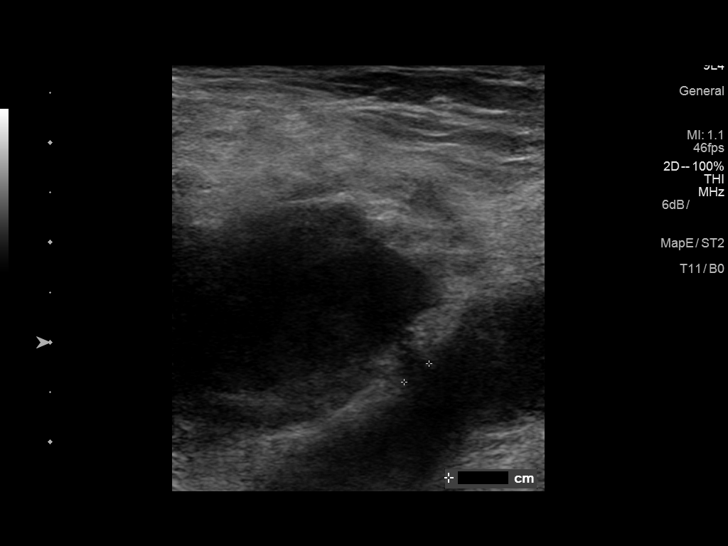

[13 of 25 positions shown; findings below may reference images not displayed]

FINDINGS: There is an approximately 3.7 x 3.3 x 5.0 cm mixed echogenic
hematoma within the right groin, minimally decreased in size
compared to the 12/15/2017 abdominal CT, previously, 4.0 x 3.5 x
cm.

There is persistent opacification of a small pseudoaneurysm arising
from the access site of the right common femoral artery which
demonstrates to and for flow (image 8).

The neck of the aneurysm measures approximately 3 mm in diameter and
extends for a length of approximately 0.5 cm (images 1 and 2, series
2).

The overall size of the opacified portion of the aneurysm measures
approximately 1.5 x 1.2 cm, unchanged to slightly decreased in size
compared to abdominal CT performed 12/15/2017 previously, 2.4 x
x 1.3 cm.
IMPRESSION: Slight reduction in size of now approximately 5 cm mixed echogenic
hematoma within the right groin with suspected slight decrease in
size of persistent small pseudoaneurysm as detailed above.

## 2018-09-17 DEATH — deceased
